# Patient Record
Sex: Female | Born: 1945
Health system: Southern US, Community
[De-identification: ages and names within clinical notes are randomized; demographics above are authoritative.]

## PROBLEM LIST (undated history)

## (undated) DIAGNOSIS — F419 Anxiety disorder, unspecified: Secondary | ICD-10-CM

## (undated) DIAGNOSIS — I509 Heart failure, unspecified: Secondary | ICD-10-CM

## (undated) DIAGNOSIS — I4891 Unspecified atrial fibrillation: Secondary | ICD-10-CM

## (undated) DIAGNOSIS — I10 Essential (primary) hypertension: Secondary | ICD-10-CM

## (undated) HISTORY — PX: TUBAL LIGATION: SHX77

---

## 2014-05-11 ENCOUNTER — Encounter (HOSPITAL_COMMUNITY): Payer: Self-pay | Admitting: Emergency Medicine

## 2014-05-11 ENCOUNTER — Emergency Department (HOSPITAL_COMMUNITY): Payer: Medicare HMO

## 2014-05-11 ENCOUNTER — Inpatient Hospital Stay (HOSPITAL_COMMUNITY)
Admission: EM | Admit: 2014-05-11 | Discharge: 2014-05-16 | DRG: 292 | Disposition: A | Discharge status: Home / Self Care | Payer: Medicare HMO | Attending: Internal Medicine | Admitting: Internal Medicine

## 2014-05-11 DIAGNOSIS — I481 Persistent atrial fibrillation: Secondary | ICD-10-CM

## 2014-05-11 DIAGNOSIS — J811 Chronic pulmonary edema: Secondary | ICD-10-CM | POA: Diagnosis present

## 2014-05-11 DIAGNOSIS — F1010 Alcohol abuse, uncomplicated: Secondary | ICD-10-CM | POA: Diagnosis present

## 2014-05-11 DIAGNOSIS — J90 Pleural effusion, not elsewhere classified: Secondary | ICD-10-CM | POA: Diagnosis present

## 2014-05-11 DIAGNOSIS — I10 Essential (primary) hypertension: Secondary | ICD-10-CM | POA: Diagnosis present

## 2014-05-11 DIAGNOSIS — F17200 Nicotine dependence, unspecified, uncomplicated: Secondary | ICD-10-CM | POA: Diagnosis present

## 2014-05-11 DIAGNOSIS — J810 Acute pulmonary edema: Secondary | ICD-10-CM | POA: Diagnosis present

## 2014-05-11 DIAGNOSIS — I4891 Unspecified atrial fibrillation: Secondary | ICD-10-CM | POA: Diagnosis present

## 2014-05-11 DIAGNOSIS — E871 Hypo-osmolality and hyponatremia: Secondary | ICD-10-CM | POA: Diagnosis present

## 2014-05-11 DIAGNOSIS — E8809 Other disorders of plasma-protein metabolism, not elsewhere classified: Secondary | ICD-10-CM | POA: Diagnosis present

## 2014-05-11 DIAGNOSIS — I272 Other secondary pulmonary hypertension: Secondary | ICD-10-CM | POA: Diagnosis present

## 2014-05-11 DIAGNOSIS — R600 Localized edema: Secondary | ICD-10-CM | POA: Diagnosis present

## 2014-05-11 DIAGNOSIS — I5023 Acute on chronic systolic (congestive) heart failure: Secondary | ICD-10-CM | POA: Diagnosis present

## 2014-05-11 DIAGNOSIS — Z716 Tobacco abuse counseling: Secondary | ICD-10-CM | POA: Diagnosis not present

## 2014-05-11 DIAGNOSIS — F1020 Alcohol dependence, uncomplicated: Secondary | ICD-10-CM | POA: Diagnosis present

## 2014-05-11 DIAGNOSIS — Z720 Tobacco use: Secondary | ICD-10-CM | POA: Diagnosis present

## 2014-05-11 DIAGNOSIS — E876 Hypokalemia: Secondary | ICD-10-CM | POA: Diagnosis present

## 2014-05-11 DIAGNOSIS — I482 Chronic atrial fibrillation: Secondary | ICD-10-CM | POA: Diagnosis present

## 2014-05-11 DIAGNOSIS — F411 Generalized anxiety disorder: Secondary | ICD-10-CM | POA: Diagnosis present

## 2014-05-11 HISTORY — DX: Essential (primary) hypertension: I10

## 2014-05-11 HISTORY — DX: Anxiety disorder, unspecified: F41.9

## 2014-05-11 LAB — BASIC METABOLIC PANEL
ANION GAP: 14 (ref 5–15)
BUN: 7 mg/dL (ref 6–23)
CALCIUM: 8.5 mg/dL (ref 8.4–10.5)
CHLORIDE: 90 meq/L — AB (ref 96–112)
CO2: 25 meq/L (ref 19–32)
Creatinine, Ser: 0.63 mg/dL (ref 0.50–1.10)
GFR calc Af Amer: >90 mL/min (ref >90)
GFR calc non Af Amer: >90 mL/min (ref >90)
Glucose, Bld: 108 mg/dL — ABNORMAL HIGH (ref 70–99)
Potassium: 3.2 mEq/L — ABNORMAL LOW (ref 3.7–5.3)
Sodium: 129 mEq/L — ABNORMAL LOW (ref 137–147)

## 2014-05-11 LAB — CBC WITH DIFFERENTIAL/PLATELET
Basophils Absolute: 0 10*3/uL (ref 0.0–0.1)
Basophils Relative: 0 % (ref 0–1)
Eosinophils Absolute: 0.1 10*3/uL (ref 0.0–0.7)
Eosinophils Relative: 1 % (ref 0–5)
HCT: 39.1 % (ref 36.0–46.0)
Hemoglobin: 13.2 g/dL (ref 12.0–15.0)
Lymphocytes Relative: 13 % (ref 12–46)
Lymphs Abs: 1 10*3/uL (ref 0.7–4.0)
MCH: 29.8 pg (ref 26.0–34.0)
MCHC: 33.8 g/dL (ref 30.0–36.0)
MCV: 88.3 fL (ref 78.0–100.0)
Monocytes Absolute: 0.5 10*3/uL (ref 0.1–1.0)
Monocytes Relative: 7 % (ref 3–12)
Neutro Abs: 6.2 10*3/uL (ref 1.7–7.7)
Neutrophils Relative %: 79 % — ABNORMAL HIGH (ref 43–77)
Platelets: 252 10*3/uL (ref 150–400)
RBC: 4.43 MIL/uL (ref 3.87–5.11)
RDW: 14.6 % (ref 11.5–15.5)
WBC: 7.8 10*3/uL (ref 4.0–10.5)

## 2014-05-11 LAB — I-STAT TROPONIN, ED: Troponin i, poc: 0.01 ng/mL (ref 0.00–0.08)

## 2014-05-11 LAB — MRSA PCR SCREENING: MRSA BY PCR: NEGATIVE

## 2014-05-11 LAB — TROPONIN I: Troponin I: <0.3 ng/mL (ref <0.30)

## 2014-05-11 LAB — PRO B NATRIURETIC PEPTIDE: Pro B Natriuretic peptide (BNP): 9349 pg/mL — ABNORMAL HIGH (ref 0–125)

## 2014-05-11 LAB — TSH: TSH: 3.26 u[IU]/mL (ref 0.350–4.500)

## 2014-05-11 LAB — PROTIME-INR
INR: 1.26 (ref 0.00–1.49)
Prothrombin Time: 15.9 seconds — ABNORMAL HIGH (ref 11.6–15.2)

## 2014-05-11 MED ORDER — SODIUM CHLORIDE 0.9 % IV SOLN
INTRAVENOUS | Status: DC
  Administered 2014-05-11: 21:00:00 via INTRAVENOUS

## 2014-05-11 MED ORDER — ACETAMINOPHEN 325 MG PO TABS: 650.0000 mg | ORAL_TABLET | Freq: Four times a day (QID) | ORAL | Status: DC | PRN

## 2014-05-11 MED ORDER — POTASSIUM CHLORIDE CRYS ER 20 MEQ PO TBCR
40.0000 meq | EXTENDED_RELEASE_TABLET | Freq: Two times a day (BID) | ORAL | Status: DC
  Filled 2014-05-11: qty 2

## 2014-05-11 MED ORDER — LORAZEPAM 2 MG/ML IJ SOLN
2.0000 mg | INTRAMUSCULAR | Status: DC | PRN
  Administered 2014-05-11: 2 mg via INTRAVENOUS
  Filled 2014-05-11: qty 1

## 2014-05-11 MED ORDER — HEPARIN BOLUS VIA INFUSION
3500.0000 [IU] | Freq: Once | INTRAVENOUS | Status: AC
  Administered 2014-05-11: 3500 [IU] via INTRAVENOUS
  Filled 2014-05-11: qty 3500

## 2014-05-11 MED ORDER — NICOTINE 14 MG/24HR TD PT24
14.0000 mg | MEDICATED_PATCH | Freq: Every day | TRANSDERMAL | Status: DC
  Administered 2014-05-11 – 2014-05-16 (×6): 14 mg via TRANSDERMAL
  Filled 2014-05-11 (×6): qty 1

## 2014-05-11 MED ORDER — ONDANSETRON HCL 4 MG PO TABS: 4.0000 mg | ORAL_TABLET | Freq: Four times a day (QID) | ORAL | Status: DC | PRN

## 2014-05-11 MED ORDER — POTASSIUM CHLORIDE 10 MEQ/100ML IV SOLN
10.0000 meq | INTRAVENOUS | Status: AC
  Administered 2014-05-11 – 2014-05-12 (×3): 10 meq via INTRAVENOUS
  Filled 2014-05-11: qty 100

## 2014-05-11 MED ORDER — DILTIAZEM HCL 100 MG IV SOLR
5.0000 mg/h | Freq: Once | INTRAVENOUS | Status: AC
  Administered 2014-05-11: 5 mg/h via INTRAVENOUS

## 2014-05-11 MED ORDER — ESCITALOPRAM OXALATE 10 MG PO TABS
20.0000 mg | ORAL_TABLET | Freq: Every day | ORAL | Status: DC
  Administered 2014-05-13 – 2014-05-15 (×3): 20 mg via ORAL
  Filled 2014-05-11 (×5): qty 1

## 2014-05-11 MED ORDER — FUROSEMIDE 10 MG/ML IJ SOLN
40.0000 mg | Freq: Once | INTRAMUSCULAR | Status: AC
  Administered 2014-05-11: 40 mg via INTRAVENOUS
  Filled 2014-05-11: qty 4

## 2014-05-11 MED ORDER — POTASSIUM CHLORIDE 10 MEQ/100ML IV SOLN
10.0000 meq | INTRAVENOUS | Status: AC
  Administered 2014-05-11: 10 meq via INTRAVENOUS
  Filled 2014-05-11: qty 100

## 2014-05-11 MED ORDER — BUSPIRONE HCL 5 MG PO TABS
5.0000 mg | ORAL_TABLET | Freq: Three times a day (TID) | ORAL | Status: DC
  Administered 2014-05-12 – 2014-05-16 (×12): 5 mg via ORAL
  Filled 2014-05-11 (×15): qty 1

## 2014-05-11 MED ORDER — FLUTICASONE PROPIONATE 50 MCG/ACT NA SUSP
2.0000 | Freq: Every day | NASAL | Status: DC
  Administered 2014-05-11 – 2014-05-16 (×6): 2 via NASAL
  Filled 2014-05-11 (×2): qty 16

## 2014-05-11 MED ORDER — DILTIAZEM LOAD VIA INFUSION
15.0000 mg | Freq: Once | INTRAVENOUS | Status: AC
  Administered 2014-05-11: 15 mg via INTRAVENOUS
  Filled 2014-05-11: qty 15

## 2014-05-11 MED ORDER — SODIUM CHLORIDE 0.9 % IJ SOLN: 3.0000 mL | INTRAMUSCULAR | Status: DC | PRN

## 2014-05-11 MED ORDER — ONDANSETRON HCL 4 MG/2ML IJ SOLN: 4.0000 mg | Freq: Four times a day (QID) | INTRAMUSCULAR | Status: DC | PRN

## 2014-05-11 MED ORDER — ALBUTEROL SULFATE (2.5 MG/3ML) 0.083% IN NEBU: 2.5000 mg | INHALATION_SOLUTION | RESPIRATORY_TRACT | Status: DC | PRN

## 2014-05-11 MED ORDER — FUROSEMIDE 10 MG/ML IJ SOLN
40.0000 mg | Freq: Two times a day (BID) | INTRAMUSCULAR | Status: DC
  Administered 2014-05-11 – 2014-05-12 (×2): 40 mg via INTRAVENOUS
  Filled 2014-05-11 (×3): qty 4

## 2014-05-11 MED ORDER — FOLIC ACID 1 MG PO TABS
1.0000 mg | ORAL_TABLET | Freq: Every day | ORAL | Status: DC
  Administered 2014-05-11 – 2014-05-16 (×6): 1 mg via ORAL
  Filled 2014-05-11 (×6): qty 1

## 2014-05-11 MED ORDER — SODIUM CHLORIDE 0.9 % IJ SOLN
3.0000 mL | Freq: Two times a day (BID) | INTRAMUSCULAR | Status: DC
  Administered 2014-05-11 – 2014-05-15 (×5): 3 mL via INTRAVENOUS

## 2014-05-11 MED ORDER — ALBUTEROL SULFATE HFA 108 (90 BASE) MCG/ACT IN AERS: 2.0000 | INHALATION_SPRAY | RESPIRATORY_TRACT | Status: DC | PRN

## 2014-05-11 MED ORDER — VITAMIN B-1 100 MG PO TABS
100.0000 mg | ORAL_TABLET | Freq: Every day | ORAL | Status: DC
  Administered 2014-05-11 – 2014-05-16 (×6): 100 mg via ORAL
  Filled 2014-05-11 (×6): qty 1

## 2014-05-11 MED ORDER — SODIUM CHLORIDE 0.9 % IV SOLN: 250.0000 mL | INTRAVENOUS | Status: DC | PRN

## 2014-05-11 MED ORDER — SODIUM CHLORIDE 0.9 % IJ SOLN
3.0000 mL | Freq: Two times a day (BID) | INTRAMUSCULAR | Status: DC
  Administered 2014-05-12 – 2014-05-15 (×7): 3 mL via INTRAVENOUS

## 2014-05-11 MED ORDER — ACETAMINOPHEN 650 MG RE SUPP: 650.0000 mg | Freq: Four times a day (QID) | RECTAL | Status: DC | PRN

## 2014-05-11 MED ORDER — DILTIAZEM HCL 100 MG IV SOLR
5.0000 mg/h | INTRAVENOUS | Status: DC
  Administered 2014-05-11 – 2014-05-12 (×2): 5 mg/h via INTRAVENOUS
  Administered 2014-05-13: 10 mg/h via INTRAVENOUS

## 2014-05-11 MED ORDER — HEPARIN (PORCINE) IN NACL 100-0.45 UNIT/ML-% IJ SOLN
1000.0000 [IU]/h | INTRAMUSCULAR | Status: DC
  Administered 2014-05-11 – 2014-05-13 (×3): 1000 [IU]/h via INTRAVENOUS
  Filled 2014-05-11 (×6): qty 250

## 2014-05-11 MED ORDER — ALPRAZOLAM 0.5 MG PO TABS: 0.5000 mg | ORAL_TABLET | Freq: Three times a day (TID) | ORAL | Status: DC | PRN

## 2014-05-11 NOTE — Progress Notes (Signed)
ANTICOAGULATION CONSULT NOTE - Initial Consult  Pharmacy Consult for heparin Indication: atrial fibrillation  Allergies  Allergen Reactions  . Penicillins Rash    Patient Measurements: Height: 5\' 4"  (162.6 cm) Weight: 153 lb (69.4 kg) IBW/kg (Calculated) : 54.7 Heparin Dosing Weight: 68 kg  Vital Signs: Temp: 97.8 F (36.6 C) (11/30 1243) Temp Source: Oral (11/30 1243) BP: 109/62 mmHg (11/30 1500) Pulse Rate: 92 (11/30 1500)  Labs:  Recent Labs  05/11/14 1339  CREATININE 0.63    Estimated Creatinine Clearance: 64.4 mL/min (by C-G formula based on Cr of 0.63).   Medical History: Past Medical History  Diagnosis Date  . Hypertension   . Anxiety    Assessment: 68 yo f who presented to the ED on 11/30 for bilateral lower leg swelling.  Patient noted to be in afib. Pharmacy is consulted to begin heparin for new onset afib.  [facility]  Hgb 13.2, plts 252, no bleeding noted.   Goal of Therapy:  Heparin level 0.3-0.7 units/ml Monitor platelets by anticoagulation protocol: Yes   Plan:  Heparin bolus 3,500 units x 1 (~51 units/kg) Heparin infusion at 1,000 units/hr (~14.7 units/kg/hr) 6-hr HL @ 2200 Daily HL and CBC Monitor hgb/plts, s/s of bleeding, clinical course  Dvante Hands L. Roseanne RenoStewart, PharmD Clinical Pharmacy Resident Pager: [phone] 05/11/2014 3:54 PM

## 2014-05-11 NOTE — H&P (Signed)
[facility] History and Physical  Rebecca FlirtLinda Kissling WUX:[id number] DOB: 08/19/1945 DOA: 05/11/2014  Referring physician: er PCP: Delorse LekBURNETT,BRENT A, MD   Chief Complaint: le swelling and SOB  HPI: Rebecca Huerta is a 68 y.o. female  Who was sent in by her primary care physician for bilateral lower extremity swelling and new onset atrial fib with rates into the 130s. Patient has symptoms of shortness of breath and fatigue that started about 2-3 weeks ago. When she went to her PCP today, she was found to be in new onset A. Fib and she was sent to the ER.  Patient denies ever having irregular heart rate previously. Patient admits to drinking vodka every day. Amount varies. Patient also smokes cigarettes. She denies illicit drugs. No chills no dizziness no blurred vision. Daughter states the patient has been having some slurred speech but this apparently is due to the alcohol consumption.   in the ER patient was started on Cardizem drip which has controlled her heart rate into the 90s.  CHAD2-vasc of at least 3   Review of Systems:  All systems reviewed, negative unless stated above    Past Medical History  Diagnosis Date  . Hypertension   . Anxiety    History reviewed. No pertinent past surgical history. Social History:  reports that she has been smoking.  She does not have any smokeless tobacco history on file. She reports that she drinks alcohol. She reports that she does not use illicit drugs.  Allergies  Allergen Reactions  . Penicillins Rash    History reviewed. No pertinent family history.   Prior to Admission medications   Medication Sig Start Date End Date Taking? Authorizing Provider  ALPRAZolam Prudy Feeler(XANAX) 0.5 MG tablet Take 0.5 mg by mouth every 6 (six) hours as needed. 04/08/14  Yes Historical Provider, MD  amLODipine (NORVASC) 5 MG tablet Take 5 mg by mouth every other day. 03/09/14  Yes Historical Provider, MD  busPIRone (BUSPAR) 5 MG tablet Take 5 mg by mouth 3 (three)  times daily. 03/11/14  Yes Historical Provider, MD  escitalopram (LEXAPRO) 20 MG tablet Take 20 mg by mouth at bedtime. 04/08/14  Yes Historical Provider, MD  fluticasone (FLONASE) 50 MCG/ACT nasal spray Place 2 sprays into both nostrils daily. 04/08/14  Yes Historical Provider, MD  lisinopril-hydrochlorothiazide (PRINZIDE,ZESTORETIC) 20-12.5 MG per tablet Take 1 tablet by mouth every other day. 03/09/14  Yes Historical Provider, MD  PROAIR HFA 108 (90 BASE) MCG/ACT inhaler Inhale 2 puffs into the lungs every 4 (four) hours as needed. 04/08/14  Yes Historical Provider, MD   Physical Exam: Filed Vitals:   05/11/14 1330 05/11/14 1345 05/11/14 1400 05/11/14 1415  BP: 127/98 114/86 109/82 110/79  Pulse: 113 87 87 92  Temp:      TempSrc:      Resp: 24 18 22 20   Height:      Weight:      SpO2: 96% 95% 94% 95%    Wt Readings from Last 3 Encounters:  05/11/14 69.4 kg (153 lb)    General:  Appears calm and comfortable Eyes: PERRL, normal lids, irises & conjunctiva ENT: grossly normal hearing, lips & tongue Neck: no LAD, masses or thyromegaly Cardiovascular: irregular, + LE edema Respiratory: CTA bilaterally, no w/r/r. Normal respiratory effort. Abdomen: soft, ntnd Skin: no rash or induration seen on limited exam Musculoskeletal: grossly normal tone BUE/BLE Psychiatric: grossly normal mood and affect, speech fluent and appropriate Neurologic: grossly non-focal.  Labs on Admission:  Basic Metabolic Panel:  Recent Labs Lab 05/11/14 1339  NA 129*  K 3.2*  CL 90*  CO2 25  GLUCOSE 108*  BUN 7  CREATININE 0.63  CALCIUM 8.5   Liver Function Tests: No results for input(s): AST, ALT, ALKPHOS, BILITOT, PROT, ALBUMIN in the last 168 hours. No results for input(s): LIPASE, AMYLASE in the last 168 hours. No results for input(s): AMMONIA in the last 168 hours. CBC: No results for input(s): WBC, NEUTROABS, HGB, HCT, MCV, PLT in the last 168 hours. Cardiac Enzymes: No results  for input(s): CKTOTAL, CKMB, CKMBINDEX, TROPONINI in the last 168 hours.  BNP (last 3 results)  Recent Labs  05/11/14 1339  PROBNP 9349.0*   CBG: No results for input(s): GLUCAP in the last 168 hours.  Radiological Exams on Admission: Dg Chest Port 1 View  05/11/2014   CLINICAL DATA:  Cardiac palpitations/tachycardia  EXAM: PORTABLE CHEST - 1 VIEW  COMPARISON:  None.  FINDINGS: There is a moderate pleural effusion on the left with left lower lobe consolidation. There is a minimal effusion on the right. Elsewhere lungs are clear. Heart is enlarged with pulmonary vascularity within normal limits. No adenopathy. There is atherosclerotic change in the aortic arch.  IMPRESSION: Moderate pleural effusion on the left with a lower lobe consolidation. Minimal effusion on the right. Cardiomegaly present.   Electronically Signed   By: Bretta BangWilliam  Woodruff M.D.   On: 05/11/2014 13:15    EKG: Independently reviewed. A fib with RVR  Assessment/Plan Active Problems:   A-fib   Edema leg   Pulmonary edema   Tobacco abuse   Alcohol abuse   New onset A. fib: Cardizem drip to be titrated to keep heart rate 90-100 bpm, heparin drip once INR is back, check TSH, cycle cardiac enzymes, echo  Pulmonary edema along with lower extremity edema: Echo, Lasix IV  Alcohol abuse: CIWA protocol  Tobacco abuse: Patient declines nicotine patch at this time  Hypo-kalemia: Replete  Hyponatremia: Probably due to alcohol consumption or volume overloaded, monitor with IV Lasix   HTN- hold home meds   Code Status: full DVT Prophylaxis: Family Communication: daughter at bedside Disposition Plan: 2-3 days  Time spent: 75 minutes  Marlin CanaryVANN, Twilla Khouri [facility] Pager [phone]

## 2014-05-11 NOTE — ED Provider Notes (Signed)
CSN: [id number]     Arrival date & time 05/11/14  1232 History   First MD Initiated Contact with Patient 05/11/14 1257     Chief Complaint  Patient presents with  . Palpitations     (Consider location/radiation/quality/duration/timing/severity/associated sxs/prior Treatment) HPI Patient presents to the emergency department with palpitations and lower extremity swelling that started one month ago.  Patient states that she has also had some shortness of breath associated with these symptoms.  Patient states that she did not have any back pain, neck pain, fever, weakness, dizziness, headache, blurred vision, cough, diaphoresis, or syncope.  The patient states that some exertion seemed to make her condition worse.  Patient states that she went to her doctor today who found that she was in atrial fibrillation and sent her to the emergency department Past Medical History  Diagnosis Date  . Hypertension   . Anxiety    History reviewed. No pertinent past surgical history. History reviewed. No pertinent family history. History  Substance Use Topics  . Smoking status: Current Every Day Smoker  . Smokeless tobacco: Not on file  . Alcohol Use: Yes   OB History    No data available     Review of Systems All other systems negative except as documented in the HPI. All pertinent positives and negatives as reviewed in the HPI.   Allergies  Penicillins  Home Medications   Prior to Admission medications   Medication Sig Start Date End Date Taking? Authorizing Provider  ALPRAZolam Prudy Feeler(XANAX) 0.5 MG tablet Take 0.5 mg by mouth every 6 (six) hours as needed. 04/08/14  Yes Historical Provider, MD  amLODipine (NORVASC) 5 MG tablet Take 5 mg by mouth every other day. 03/09/14  Yes Historical Provider, MD  busPIRone (BUSPAR) 5 MG tablet Take 5 mg by mouth 3 (three) times daily. 03/11/14  Yes Historical Provider, MD  escitalopram (LEXAPRO) 20 MG tablet Take 20 mg by mouth at bedtime. 04/08/14  Yes  Historical Provider, MD  fluticasone (FLONASE) 50 MCG/ACT nasal spray Place 2 sprays into both nostrils daily. 04/08/14  Yes Historical Provider, MD  lisinopril-hydrochlorothiazide (PRINZIDE,ZESTORETIC) 20-12.5 MG per tablet Take 1 tablet by mouth every other day. 03/09/14  Yes Historical Provider, MD  PROAIR HFA 108 (90 BASE) MCG/ACT inhaler Inhale 2 puffs into the lungs every 4 (four) hours as needed. 04/08/14  Yes Historical Provider, MD   BP 110/79 mmHg  Pulse 92  Temp(Src) 97.8 F (36.6 C) (Oral)  Resp 20  Ht 5\' 4"  (1.626 m)  Wt 153 lb (69.4 kg)  BMI 26.25 kg/m2  SpO2 95% Physical Exam  Constitutional: She is oriented to person, place, and time. She appears well-developed and well-nourished. No distress.  HENT:  Head: Normocephalic and atraumatic.  Mouth/Throat: Oropharynx is clear and moist.  Eyes: Pupils are equal, round, and reactive to light.  Neck: Normal range of motion. Neck supple.  Cardiovascular: An irregularly irregular rhythm present. Tachycardia present.  Exam reveals no gallop and no friction rub.   No murmur heard. Pulmonary/Chest: Effort normal and breath sounds normal. No respiratory distress.  Musculoskeletal: She exhibits edema.  Neurological: She is alert and oriented to person, place, and time. She exhibits normal muscle tone. Coordination normal.  Skin: Skin is warm and dry.  Nursing note and vitals reviewed.   ED Course  Procedures (including critical care time) Labs Review Labs Reviewed  BASIC METABOLIC PANEL - Abnormal; Notable for the following:    Sodium 129 (*)    Potassium 3.2 (*)  Chloride 90 (*)    Glucose, Bld 108 (*)    All other components within normal limits  PRO B NATRIURETIC PEPTIDE - Abnormal; Notable for the following:    Pro B Natriuretic peptide (BNP) 9349.0 (*)    All other components within normal limits  Rosezena SensorI-STAT TROPOININ, ED    Imaging Review Dg Chest Port 1 View  05/11/2014   CLINICAL DATA:  Cardiac  palpitations/tachycardia  EXAM: PORTABLE CHEST - 1 VIEW  COMPARISON:  None.  FINDINGS: There is a moderate pleural effusion on the left with left lower lobe consolidation. There is a minimal effusion on the right. Elsewhere lungs are clear. Heart is enlarged with pulmonary vascularity within normal limits. No adenopathy. There is atherosclerotic change in the aortic arch.  IMPRESSION: Moderate pleural effusion on the left with a lower lobe consolidation. Minimal effusion on the right. Cardiomegaly present.   Electronically Signed   By: Bretta BangWilliam  Woodruff M.D.   On: 05/11/2014 13:15     EKG Interpretation   Date/Time:  Monday May 11 2014 12:44:52 EST Ventricular Rate:  128 PR Interval:    QRS Duration: 98 QT Interval:  340 QTC Calculation: 496 R Axis:   -67 Text Interpretation:  Atrial fibrillation Left axis deviation Non-specific  intra-ventricular conduction delay Non-specific ST-t changes No previous  tracing Confirmed by Denton LankSTEINL  MD, Caryn BeeKEVIN ([id number]) on 05/11/2014 1:01:19 PM      Patient will be admitted to the stepdown unit on 3 W. after speaking with the [facility]    Jamesetta OrleansChristopher W OceolaLawyer, PA-C 05/11/14 1519  Suzi RootsKevin E Steinl, MD 05/11/14 [phone]

## 2014-05-11 NOTE — ED Notes (Signed)
Went to pmd pta for bilat lower leg swelling and weeping and was found to be in afib new onset in 130s denies any discomfort

## 2014-05-12 ENCOUNTER — Inpatient Hospital Stay (HOSPITAL_COMMUNITY): Payer: Medicare HMO

## 2014-05-12 DIAGNOSIS — E871 Hypo-osmolality and hyponatremia: Secondary | ICD-10-CM | POA: Diagnosis present

## 2014-05-12 DIAGNOSIS — E876 Hypokalemia: Secondary | ICD-10-CM | POA: Diagnosis present

## 2014-05-12 DIAGNOSIS — I059 Rheumatic mitral valve disease, unspecified: Secondary | ICD-10-CM

## 2014-05-12 DIAGNOSIS — J90 Pleural effusion, not elsewhere classified: Secondary | ICD-10-CM | POA: Diagnosis present

## 2014-05-12 DIAGNOSIS — F411 Generalized anxiety disorder: Secondary | ICD-10-CM | POA: Diagnosis present

## 2014-05-12 DIAGNOSIS — I4891 Unspecified atrial fibrillation: Secondary | ICD-10-CM

## 2014-05-12 DIAGNOSIS — I10 Essential (primary) hypertension: Secondary | ICD-10-CM

## 2014-05-12 DIAGNOSIS — R600 Localized edema: Secondary | ICD-10-CM

## 2014-05-12 DIAGNOSIS — I5023 Acute on chronic systolic (congestive) heart failure: Secondary | ICD-10-CM | POA: Diagnosis present

## 2014-05-12 DIAGNOSIS — J810 Acute pulmonary edema: Secondary | ICD-10-CM | POA: Diagnosis present

## 2014-05-12 LAB — BASIC METABOLIC PANEL
Anion gap: 12 (ref 5–15)
BUN: 8 mg/dL (ref 6–23)
CHLORIDE: 93 meq/L — AB (ref 96–112)
CO2: 27 meq/L (ref 19–32)
Calcium: 8.4 mg/dL (ref 8.4–10.5)
Creatinine, Ser: 0.78 mg/dL (ref 0.50–1.10)
GFR calc Af Amer: >90 mL/min (ref >90)
GFR, EST NON AFRICAN AMERICAN: 84 mL/min — AB (ref >90)
GLUCOSE: 96 mg/dL (ref 70–99)
POTASSIUM: 3.5 meq/L — AB (ref 3.7–5.3)
Sodium: 132 mEq/L — ABNORMAL LOW (ref 137–147)

## 2014-05-12 LAB — COMPREHENSIVE METABOLIC PANEL
ALK PHOS: 81 U/L (ref 39–117)
ALT: 35 U/L (ref 0–35)
AST: 31 U/L (ref 0–37)
Albumin: 3 g/dL — ABNORMAL LOW (ref 3.5–5.2)
Anion gap: 16 — ABNORMAL HIGH (ref 5–15)
BUN: 9 mg/dL (ref 6–23)
CALCIUM: 8.8 mg/dL (ref 8.4–10.5)
CO2: 26 meq/L (ref 19–32)
Chloride: 94 mEq/L — ABNORMAL LOW (ref 96–112)
Creatinine, Ser: 0.84 mg/dL (ref 0.50–1.10)
GFR, EST AFRICAN AMERICAN: 81 mL/min — AB (ref >90)
GFR, EST NON AFRICAN AMERICAN: 70 mL/min — AB (ref >90)
GLUCOSE: 101 mg/dL — AB (ref 70–99)
POTASSIUM: 3.2 meq/L — AB (ref 3.7–5.3)
SODIUM: 136 meq/L — AB (ref 137–147)
Total Bilirubin: 0.7 mg/dL (ref 0.3–1.2)
Total Protein: 6.4 g/dL (ref 6.0–8.3)

## 2014-05-12 LAB — BLOOD GAS, ARTERIAL
ACID-BASE EXCESS: 3.2 mmol/L — AB (ref 0.0–2.0)
Bicarbonate: 26.5 mEq/L — ABNORMAL HIGH (ref 20.0–24.0)
Drawn by: 249101
FIO2: 0.21 %
O2 SAT: 93.5 %
PCO2 ART: 35.7 mmHg (ref 35.0–45.0)
Patient temperature: 98.6
TCO2: 27.6 mmol/L (ref 0–100)
pH, Arterial: 7.483 — ABNORMAL HIGH (ref 7.350–7.450)
pO2, Arterial: 64.4 mmHg — ABNORMAL LOW (ref 80.0–100.0)

## 2014-05-12 LAB — CBC
HCT: 37 % (ref 36.0–46.0)
Hemoglobin: 12.4 g/dL (ref 12.0–15.0)
MCH: 29.9 pg (ref 26.0–34.0)
MCHC: 33.5 g/dL (ref 30.0–36.0)
MCV: 89.2 fL (ref 78.0–100.0)
Platelets: 225 10*3/uL (ref 150–400)
RBC: 4.15 MIL/uL (ref 3.87–5.11)
RDW: 14.9 % (ref 11.5–15.5)
WBC: 7.3 10*3/uL (ref 4.0–10.5)

## 2014-05-12 LAB — RAPID URINE DRUG SCREEN, HOSP PERFORMED
AMPHETAMINES: NOT DETECTED
BENZODIAZEPINES: NOT DETECTED
Barbiturates: NOT DETECTED
COCAINE: NOT DETECTED
OPIATES: NOT DETECTED
Tetrahydrocannabinol: NOT DETECTED

## 2014-05-12 LAB — TROPONIN I
Troponin I: <0.3 ng/mL (ref <0.30)
Troponin I: <0.3 ng/mL (ref <0.30)

## 2014-05-12 LAB — HEPARIN LEVEL (UNFRACTIONATED)
Heparin Unfractionated: 0.47 IU/mL (ref 0.30–0.70)
Heparin Unfractionated: 0.47 IU/mL (ref 0.30–0.70)

## 2014-05-12 LAB — MAGNESIUM: Magnesium: 1.6 mg/dL (ref 1.5–2.5)

## 2014-05-12 MED ORDER — MAGNESIUM SULFATE 2 GM/50ML IV SOLN
2.0000 g | Freq: Once | INTRAVENOUS | Status: AC
Start: 2014-05-13 — End: 2014-05-13
  Administered 2014-05-13: 2 g via INTRAVENOUS
  Filled 2014-05-12: qty 50

## 2014-05-12 MED ORDER — LORAZEPAM 2 MG/ML IJ SOLN: 0.0000 mg | Freq: Two times a day (BID) | INTRAMUSCULAR | Status: DC

## 2014-05-12 MED ORDER — CARVEDILOL 6.25 MG PO TABS
6.2500 mg | ORAL_TABLET | Freq: Two times a day (BID) | ORAL | Status: DC
  Administered 2014-05-13: 6.25 mg via ORAL
  Filled 2014-05-12 (×4): qty 1

## 2014-05-12 MED ORDER — LORAZEPAM 2 MG/ML IJ SOLN: 0.0000 mg | INTRAMUSCULAR | Status: AC

## 2014-05-12 MED ORDER — LORAZEPAM 2 MG/ML IJ SOLN: 0.0000 mg | INTRAMUSCULAR | Status: DC

## 2014-05-12 MED ORDER — LORAZEPAM 2 MG/ML IJ SOLN: 0.0000 mg | Freq: Four times a day (QID) | INTRAMUSCULAR | Status: DC

## 2014-05-12 MED ORDER — LORAZEPAM 1 MG PO TABS: 1.0000 mg | ORAL_TABLET | ORAL | Status: DC | PRN

## 2014-05-12 MED ORDER — LORAZEPAM 2 MG/ML IJ SOLN: 1.0000 mg | INTRAMUSCULAR | Status: DC | PRN

## 2014-05-12 MED ORDER — FUROSEMIDE 10 MG/ML IJ SOLN
80.0000 mg | Freq: Once | INTRAMUSCULAR | Status: AC
  Administered 2014-05-12: 80 mg via INTRAVENOUS

## 2014-05-12 MED ORDER — POTASSIUM CHLORIDE 10 MEQ/100ML IV SOLN
10.0000 meq | INTRAVENOUS | Status: AC
  Administered 2014-05-13 (×3): 10 meq via INTRAVENOUS
  Filled 2014-05-12 (×3): qty 100

## 2014-05-12 MED ORDER — LORAZEPAM 2 MG/ML IJ SOLN
1.0000 mg | INTRAMUSCULAR | Status: DC | PRN
Start: 2014-05-12 — End: 2014-05-12

## 2014-05-12 MED ORDER — FUROSEMIDE 10 MG/ML IJ SOLN
60.0000 mg | Freq: Two times a day (BID) | INTRAMUSCULAR | Status: DC
  Administered 2014-05-12 – 2014-05-15 (×6): 60 mg via INTRAVENOUS
  Filled 2014-05-12 (×8): qty 6

## 2014-05-12 NOTE — Progress Notes (Signed)
ANTICOAGULATION CONSULT NOTE Pharmacy Consult for heparin Indication: atrial fibrillation   Allergies  Allergen Reactions  . Penicillins Rash    Patient Measurements: Height: 5\' 4"  (162.6 cm) Weight: 155 lb 3.2 oz (70.398 kg) IBW/kg (Calculated) : 54.7 Heparin Dosing Weight: 68 kg  Vital Signs: Temp: 97.5 F (36.4 C) (12/01 0700) Temp Source: Oral (12/01 0700) BP: 135/102 mmHg (12/01 0800) Pulse Rate: 90 (12/01 1100)  Labs:  Recent Labs  05/11/14 1339 05/11/14 1505 05/11/14 1600 05/11/14 1811 05/11/14 2323 05/12/14 0500  HGB  --  13.2  --   --   --  12.4  HCT  --  39.1  --   --   --  37.0  PLT  --  252  --   --   --  225  LABPROT  --   --  15.9*  --   --   --   INR  --   --  1.26  --   --   --   HEPARINUNFRC  --   --   --   --  0.47 0.47  CREATININE 0.63  --   --   --   --  0.78  TROPONINI  --   --   --  <0.30 <0.30 <0.30    Estimated Creatinine Clearance: 64.8 mL/min (by C-G formula based on Cr of 0.78).  Assessment: 68 yo f who presented to the ED on 11/30 for bilateral lower leg swelling. Pharmacy is consulted to begin heparin for new onset afib.   PMH: Afib, EtOH/tobacco abuse, peripheral edema  Anticoagulation: New onset afib - Patient is not on AC PTA.  Heparin within goal.  No bleeding noted.  H/H wnl, plt wnl.   Cardiovascular: Afib - BP within goal, HR 90-100s.   On furosemide 40 mg BID, diltiazem gtt 5mg /hr.    Nephrology: Crcl ~60-65 mL/min, K low - supped , Mg?  PTA Medication Issues:  none  Best Practices: DVT Px: Hep gtt  Goal of Therapy:  Heparin level 0.3-0.7 units/ml Monitor platelets by anticoagulation protocol: Yes   Plan:  - Continue heparin infusion at 1,000 units/hr - Daily HL and CBC - Monitor hgb/plts, s/s of bleeding, clinical course  Red ChristiansSamson Aydin Cavalieri, Pharm. D. Clinical Pharmacy Resident Pager: [phone] Ph: [phone] 05/12/2014 11:52 AM

## 2014-05-12 NOTE — Progress Notes (Signed)
ANTICOAGULATION CONSULT NOTE Pharmacy Consult for heparin Indication: atrial fibrillation  Allergies  Allergen Reactions  . Penicillins Rash    Patient Measurements: Height: 5\' 4"  (162.6 cm) Weight: 153 lb (69.4 kg) IBW/kg (Calculated) : 54.7 Heparin Dosing Weight: 68 kg  Vital Signs: Temp: 98.3 F (36.8 C) (11/30 2323) Temp Source: Oral (11/30 2323) BP: 114/71 mmHg (11/30 2100) Pulse Rate: 104 (11/30 2100)  Labs:  Recent Labs  05/11/14 1339 05/11/14 1505 05/11/14 1600 05/11/14 1811 05/11/14 2323  HGB  --  13.2  --   --   --   HCT  --  39.1  --   --   --   PLT  --  252  --   --   --   LABPROT  --   --  15.9*  --   --   INR  --   --  1.26  --   --   HEPARINUNFRC  --   --   --   --  0.47  CREATININE 0.63  --   --   --   --   TROPONINI  --   --   --  <0.30 <0.30    Estimated Creatinine Clearance: 64.4 mL/min (by C-G formula based on Cr of 0.63).   Assessment: 68 yo female with Afib for heparin  Goal of Therapy:  Heparin level 0.3-0.7 units/ml Monitor platelets by anticoagulation protocol: Yes   Plan:  Continue Heparin at current rate  Follow-up am labs.  Geannie RisenGreg Hyder Deman, PharmD, BCPS   05/12/2014 12:44 AM

## 2014-05-12 NOTE — Care Management Note (Addendum)
    Page 1 of 1   05/14/2014     9:53:32 AM CARE MANAGEMENT NOTE 05/14/2014  Patient:  Rebecca Huerta,Rebecca Huerta   Account Number:  [id number]  Date Initiated:  05/12/2014  Documentation initiated by:  Junius CreamerWELL,DEBBIE  Subjective/Objective Assessment:   adm w at fib     Action/Plan:   lives w husband, pcp dr Kipp Broodbrent burnette   Anticipated DC Date:  05/16/2014   Anticipated DC Plan:  HOME/SELF CARE      DC Planning Services  CM consult  Medication Assistance      Choice offered to / List presented to:             Status of service:   Medicare Important Message given?  YES (If response is "NO", the following Medicare IM given date fields will be blank) Date Medicare IM given:  05/14/2014 Medicare IM given by:  Junius CreamerWELL,DEBBIE Date Additional Medicare IM given:   Additional Medicare IM given by:    Discharge Disposition:    Per UR Regulation:  Reviewed for med. necessity/level of care/duration of stay  If discussed at Long Length of Stay Meetings, dates discussed:    Comments:  12/3  0932 debbie Arie Powell rn,bsn pt will have 45.00 per month copay for xarelto, eliquis will have 170.62 per month copay, pradaxa will have 45.00 per month copay and pradaxa also requires prior auth at [phone].  12/2 1531 debbie Kourtlynn Trevor rn,bsn having cm sec ck on copay for pradaxa-eliquis-xarelto.

## 2014-05-12 NOTE — Plan of Care (Signed)
Problem: Consults Goal: Atrial Arhythmia Patient Education (See Patient Education module for education specifics.)  Outcome: Progressing Goal: Skin Care Protocol Initiated - if Braden Score 18 or less If consults are not indicated, leave blank or document N/A  Outcome: Not Applicable Date Met:  12/78/71 Goal: Tobacco Cessation referral if indicated Outcome: Completed/Met Date Met:  05/12/14 Pt states she has no intention of quitting smoking. Goal: Nutrition Consult-if indicated Outcome: Not Applicable Date Met:  83/67/25  Problem: Phase I Progression Outcomes Goal: If Ablation, see post EP orders Outcome: Not Applicable Date Met:  50/01/64 Goal: Initial discharge plan identified Outcome: Completed/Met Date Met:  05/12/14 Goal: Hemodynamically stable Outcome: Completed/Met Date Met:  05/12/14 Goal: Other Phase I Outcomes/Goals Outcome: Progressing  Problem: Phase II Progression Outcomes Goal: Ventricular heart rate < 100/min Outcome: Progressing Goal: Anticoagulation Therapy per MD order Outcome: Completed/Met Date Met:  05/12/14 Goal: CV Risk Factors identified Outcome: Completed/Met Date Met:  05/12/14 Goal: Pain controlled Outcome: Completed/Met Date Met:  05/12/14 Goal: Progress activity as tolerated unless otherwise ordered Outcome: Progressing Goal: Discharge plan established Outcome: Completed/Met Date Met:  05/12/14 Goal: Tolerating diet Outcome: Completed/Met Date Met:  05/12/14

## 2014-05-12 NOTE — Progress Notes (Signed)
[place] TEAM 1 - Stepdown/ICU TEAM Progress Note  Rebecca Huerta WUJ:[id number] DOB: 13-Apr-1946 DOA: 05/11/2014 PCP: Delorse Lek, MD  Admit HPI / Brief Narrative: Rebecca Huerta is a 68 y.o. WF PMHx  anxiety, alcohol abuse,acute on chronic systolic CHF, HTN, Who was sent in by her primary care physician for bilateral lower extremity swelling and new onset atrial fib with rates into the 130s. Patient has symptoms of shortness of breath and fatigue that started about 2-3 weeks ago. When she went to her PCP today, she was found to be in new onset A. Fib and she was sent to the ER. Patient denies ever having irregular heart rate previously. Patient admits to drinking vodka every day. Amount varies. Patient also smokes cigarettes. She denies illicit drugs. No chills no dizziness no blurred vision. Daughter states the patient has been having some slurred speech but this apparently is due to the alcohol consumption.  in the ER patient was started on Cardizem drip which has controlled her heart rate into the 90s.  CHAD2-vasc of at least 3  HPI/Subjective: 12/1 A/O 4, patient states swelling of ankles and legs started approximately 2 weeks ago, positive SOB, negative CP, states smokes 1 PPD 50 years. Drinks 3 glasses of blocker daily (unsure of total amount). States was seen by her PCM Dr. Mady Gemma one month ago for similar signs and symptoms. States at that time was started on Lexapro and Xanax for her symptoms.  Assessment/Plan:  New onset A. fib:  -Most likely multifactorial alcohol abuse, acute on chronic decompensated CHF.  -Continue Cardizem drip titrate to keep heart rate 60-90 bpm  -Continue heparin drip  -TSH within normal limit  -Troponin 3 negative  Systolic CHF (most likely  acute on chronic) -Lasix IV 80 mg 1 -Increase Lasix 60 mg BID -Cardiology consult in the a.m., patient has never seen a cardiologist.   HTN  -Start on Coreg  6.25 mg BID -Monitor BP closely,  would decrease Cardizem before holding Coreg secondary to patient's significant systolic CHF.   Pulmonary hypertension  -See systolic CHF, HTN   Pleural effusion -Lt>>>Rt, will try aggressive diuresis, if no improvement in 24-48 hrs, will perform thoracentesis.   Coughing spasms; aspiration? -Most likely secondary to decompensated CHF, pleural effusion -Patient with possible aspiration, will make patient nothing by mouth until she passes swallow study -See pleural effusion   Pulmonary edema along with lower extremity edema: -See systolic CHF, and echocardiogram results below  Alcohol abuse:  -CIWA  -UDS negative  Tobacco abuse:  -Patient counseled on need to stop smoking, not interested in stopping; declines nicotine patch at this time  Hypo-kalemia: -Replete -Goal maintain potassium>4  Hypomagnesemia -Replete -Goal maintain magnesium> 2  Hyponatremia: -Probably due to alcohol consumption or volume overloaded, monitor with IV Lasix   HTN - hold home meds   Code Status: FULL Family Communication: Daughter and best friend present at time of exam Disposition Plan: Resolution CHF    Consultants:   Procedure/Significant Events: 11/30 PCXR; Moderate pleural effusion on the left with a lower lobe consolidation. Minimal effusion on right 12/1 echocardiogram;Left ventricle: mild LVH. Systolic function severely reduced.  -LVEF= 25% to 30%. Diffuse hypokinesis. - Mitral valve: There was severe regurgitation. - Left atrium: The atrium was moderately to severely dilated. - Tricuspid valve: There was moderate regurgitation. - Pulmonary arteries: PA peak pressure: 35 mm Hg (S). - Pericardium, extracardiac: A trivial pericardial effusion was identified. There was a left pleural effusion.   Culture  Antibiotics:   DVT prophylaxis: Heparin drip   Devices    LINES / TUBES:      Continuous Infusions: . sodium chloride 5 mL/hr at 05/12/14 0800  . diltiazem  (CARDIZEM) infusion 10 mg/hr (05/12/14 1750)  . heparin 1,000 Units/hr (05/12/14 1317)    Objective: VITAL SIGNS: Temp: 97.4 F (36.3 C) (12/01 1600) Temp Source: Oral (12/01 1600) BP: 137/91 mmHg (12/01 1900) Pulse Rate: 113 (12/01 1900) SPO2; FIO2:   Intake/Output Summary (Last 24 hours) at 05/12/14 1919 Last data filed at 05/12/14 1900  Gross per 24 hour  Intake 1362.25 ml  Output   1375 ml  Net -12.75 ml     Exam: General: A/O 4, NAD, No acute respiratory distress Lungs: Decreased breath sounds lingula/LLL/RLL, diffuse rhonchi remainder of lung, diffuse expiratory wheezing Cardiovascular: Irregular irregular rhythm and rate, without murmur gallop or rub normal S1 and S2 Abdomen: Nontender, nondistended, soft, bowel sounds positive, no rebound, no ascites, no appreciable mass Extremities: No significant cyanosis, clubbing, bilateral pitting edema to waist 2+ to 3+ pitting  edema     Data Reviewed: Basic Metabolic Panel:  Recent Labs Lab 05/11/14 1339 05/12/14 0500  NA 129* 132*  K 3.2* 3.5*  CL 90* 93*  CO2 25 27  GLUCOSE 108* 96  BUN 7 8  CREATININE 0.63 0.78  CALCIUM 8.5 8.4   Liver Function Tests: No results for input(s): AST, ALT, ALKPHOS, BILITOT, PROT, ALBUMIN in the last 168 hours. No results for input(s): LIPASE, AMYLASE in the last 168 hours. No results for input(s): AMMONIA in the last 168 hours. CBC:  Recent Labs Lab 05/11/14 1505 05/12/14 0500  WBC 7.8 7.3  NEUTROABS 6.2  --   HGB 13.2 12.4  HCT 39.1 37.0  MCV 88.3 89.2  PLT 252 225   Cardiac Enzymes:  Recent Labs Lab 05/11/14 1811 05/11/14 2323 05/12/14 0500  TROPONINI <0.30 <0.30 <0.30   BNP (last 3 results)  Recent Labs  05/11/14 1339  PROBNP 9349.0*   CBG: No results for input(s): GLUCAP in the last 168 hours.  Recent Results (from the past 240 hour(s))  MRSA PCR Screening     Status: None   Collection Time: 05/11/14  4:53 PM  Result Value Ref Range Status    MRSA by PCR NEGATIVE NEGATIVE Final    Comment:        The GeneXpert MRSA Assay (FDA approved for NASAL specimens only), is one component of a comprehensive MRSA colonization surveillance program. It is not intended to diagnose MRSA infection nor to guide or monitor treatment for MRSA infections.      Studies:  Recent x-ray studies have been reviewed in detail by the Attending Physician  Scheduled Meds:  Scheduled Meds: . busPIRone  5 mg Oral TID  . escitalopram  20 mg Oral QHS  . fluticasone  2 spray Each Nare Daily  . folic acid  1 mg Oral Daily  . furosemide  60 mg Intravenous BID  . LORazepam  0-4 mg Intravenous Q4H   Followed by  . [START ON 05/14/2014] LORazepam  0-4 mg Intravenous Q12H  . nicotine  14 mg Transdermal Daily  . sodium chloride  3 mL Intravenous Q12H  . sodium chloride  3 mL Intravenous Q12H  . thiamine  100 mg Oral Daily    Time spent on care of this patient: 40 mins   Drema DallasWOODS, Izadora Roehr, J , MD   [facility] Office  [phone] Pager [phone]- [phone]  On-Call/Text Page:  [email or web address]      password [email or web address]  If 7PM-7AM, please contact night-coverage [email or web address] Password [email or web address] 05/12/2014, 7:19 PM   LOS: 1 day

## 2014-05-12 NOTE — Progress Notes (Signed)
  Echocardiogram 2D Echocardiogram has been performed.  Rebecca Huerta FRANCES 05/12/2014, 3:28 PM

## 2014-05-12 NOTE — Progress Notes (Signed)
Dr. Joseph ArtWoods notified that abg and cxr are completed. Requested clarification on CIWA orders.  Will continue to monitor pt closely.

## 2014-05-12 NOTE — Plan of Care (Signed)
Problem: Consults Goal: Atrial Arhythmia Patient Education (See Patient Education module for education specifics.) Outcome: Progressing Goal: Skin Care Protocol Initiated - if Braden Score 18 or less If consults are not indicated, leave blank or document N/A Outcome: Progressing Goal: Tobacco Cessation referral if indicated Outcome: Progressing Goal: Diabetes Guidelines if Diabetic/Glucose > 140 If diabetic or lab glucose is > 140 mg/dl - Initiate Diabetes/Hyperglycemia Guidelines & Document Interventions  Outcome: Not Applicable Date Met:  20/35/59  Problem: Phase I Progression Outcomes Goal: Ventricular heart rate < 120/min Outcome: Completed/Met Date Met:  05/12/14 Goal: Anticoagulation Therapy per MD order Outcome: Completed/Met Date Met:  05/12/14 Goal: Heart rate or rhythm control medication Outcome: Completed/Met Date Met:  05/12/14 Goal: Pain controlled with appropriate interventions Outcome: Completed/Met Date Met:  05/12/14 Goal: Initial discharge plan identified Outcome: Progressing

## 2014-05-12 NOTE — Progress Notes (Addendum)
   05/12/14 1600  Clinical Encounter Type  Visited With Health care provider  Visit Type Initial   Chaplain was referred to patient via spiritual care consult. Chaplain spoke to patient's nurse who explained that Theda BelfastBob Hamilton, the director of the spiritual care department, had been by and addressed the needs in the consult. Page Merrilyn Puman-Call Chaplain if any follow up is necessary. Cranston NeighborStrother, Cesar Rogerson R, Chaplain  4:50 PM

## 2014-05-12 NOTE — Progress Notes (Signed)
Dr. Joseph ArtWoods notified of pt having coughing spells after eating and/or drinking.  Also notified of tachypnea and increased heart rate to 120s. Pt made npo. Will continue to monitor pt closely.

## 2014-05-13 LAB — COMPREHENSIVE METABOLIC PANEL
ALT: 33 U/L (ref 0–35)
ANION GAP: 17 — AB (ref 5–15)
AST: 27 U/L (ref 0–37)
Albumin: 3 g/dL — ABNORMAL LOW (ref 3.5–5.2)
Alkaline Phosphatase: 84 U/L (ref 39–117)
BUN: 7 mg/dL (ref 6–23)
CO2: 26 mEq/L (ref 19–32)
Calcium: 9 mg/dL (ref 8.4–10.5)
Chloride: 91 mEq/L — ABNORMAL LOW (ref 96–112)
Creatinine, Ser: 0.76 mg/dL (ref 0.50–1.10)
GFR calc Af Amer: >90 mL/min (ref >90)
GFR calc non Af Amer: 85 mL/min — ABNORMAL LOW (ref >90)
Glucose, Bld: 106 mg/dL — ABNORMAL HIGH (ref 70–99)
Potassium: 3.2 mEq/L — ABNORMAL LOW (ref 3.7–5.3)
SODIUM: 134 meq/L — AB (ref 137–147)
TOTAL PROTEIN: 6.5 g/dL (ref 6.0–8.3)
Total Bilirubin: 0.7 mg/dL (ref 0.3–1.2)

## 2014-05-13 LAB — CBC
HEMATOCRIT: 40 % (ref 36.0–46.0)
Hemoglobin: 13.4 g/dL (ref 12.0–15.0)
MCH: 29.9 pg (ref 26.0–34.0)
MCHC: 33.5 g/dL (ref 30.0–36.0)
MCV: 89.3 fL (ref 78.0–100.0)
Platelets: 263 10*3/uL (ref 150–400)
RBC: 4.48 MIL/uL (ref 3.87–5.11)
RDW: 15 % (ref 11.5–15.5)
WBC: 7.2 10*3/uL (ref 4.0–10.5)

## 2014-05-13 LAB — MAGNESIUM: Magnesium: 1.8 mg/dL (ref 1.5–2.5)

## 2014-05-13 LAB — HEPARIN LEVEL (UNFRACTIONATED): Heparin Unfractionated: 0.58 IU/mL (ref 0.30–0.70)

## 2014-05-13 MED ORDER — CARVEDILOL 12.5 MG PO TABS
12.5000 mg | ORAL_TABLET | Freq: Two times a day (BID) | ORAL | Status: DC
  Administered 2014-05-13 – 2014-05-16 (×6): 12.5 mg via ORAL
  Filled 2014-05-13 (×7): qty 1

## 2014-05-13 MED ORDER — DIGOXIN 250 MCG PO TABS
0.2500 mg | ORAL_TABLET | Freq: Three times a day (TID) | ORAL | Status: AC
  Administered 2014-05-13 (×3): 0.25 mg via ORAL
  Filled 2014-05-13 (×4): qty 1

## 2014-05-13 MED ORDER — MAGNESIUM OXIDE 400 (241.3 MG) MG PO TABS
400.0000 mg | ORAL_TABLET | Freq: Two times a day (BID) | ORAL | Status: AC
  Administered 2014-05-14: 400 mg via ORAL
  Filled 2014-05-13 (×2): qty 1

## 2014-05-13 MED ORDER — DIGOXIN 125 MCG PO TABS
0.1250 mg | ORAL_TABLET | Freq: Every day | ORAL | Status: DC
  Administered 2014-05-14 – 2014-05-16 (×3): 0.125 mg via ORAL
  Filled 2014-05-13 (×3): qty 1

## 2014-05-13 MED ORDER — MAGNESIUM SULFATE 2 GM/50ML IV SOLN
2.0000 g | Freq: Once | INTRAVENOUS | Status: AC
  Administered 2014-05-13: 2 g via INTRAVENOUS
  Filled 2014-05-13: qty 50

## 2014-05-13 MED ORDER — POTASSIUM CHLORIDE CRYS ER 20 MEQ PO TBCR
20.0000 meq | EXTENDED_RELEASE_TABLET | Freq: Two times a day (BID) | ORAL | Status: DC
  Administered 2014-05-13 – 2014-05-16 (×7): 20 meq via ORAL
  Filled 2014-05-13 (×6): qty 1

## 2014-05-13 MED ORDER — LISINOPRIL 2.5 MG PO TABS
2.5000 mg | ORAL_TABLET | Freq: Every day | ORAL | Status: DC
  Administered 2014-05-13 – 2014-05-14 (×2): 2.5 mg via ORAL
  Filled 2014-05-13 (×2): qty 1

## 2014-05-13 MED ORDER — POTASSIUM CHLORIDE CRYS ER 20 MEQ PO TBCR
40.0000 meq | EXTENDED_RELEASE_TABLET | Freq: Two times a day (BID) | ORAL | Status: AC
  Administered 2014-05-13 (×2): 40 meq via ORAL
  Filled 2014-05-13 (×2): qty 2

## 2014-05-13 NOTE — Evaluation (Signed)
Clinical/Bedside Swallow Evaluation Patient Details  Name: Rebecca FlirtLinda Namba MRN: [id number] Date of Birth: 06/18/1945  Today's Date: 05/13/2014 Time: 0830-0840 SLP Time Calculation (min) (ACUTE ONLY): 10 min  Past Medical History:  Past Medical History  Diagnosis Date  . Hypertension   . Anxiety    Past Surgical History: History reviewed. No pertinent past surgical history. HPI:  Rebecca Huerta is a 68 y.o. WF PMHx anxiety, alcohol abuse,acute on chronic systolic CHF, HTN,who was sent in by her primary care physician for bilateral lower extremity swelling and new onset atrial fib with rates into the 130s. Patient has symptoms of shortness of breath and fatigue that started about 2-3 weeks ago. When she went to her PCP today, she was found to be in new onset A. Fib and she was sent to the ER. Patient admits to drinking vodka every day. Pt with frequent coughing, specifically observed when drinking.    Assessment / Plan / Recommendation Clinical Impression  Pt does not demonstrate any evidence of aspiration or dysphagia. Recommend pt resume a regular texture, thin liquid diet. No SLP f/u needed, will sign off.     Aspiration Risk  Mild    Diet Recommendation Regular;Thin liquid   Liquid Administration via: Cup;Straw Medication Administration: Whole meds with liquid Supervision: Patient able to self feed Postural Changes and/or Swallow Maneuvers: Seated upright 90 degrees    Other  Recommendations Oral Care Recommendations: Oral care BID   Follow Up Recommendations       Frequency and Duration        Pertinent Vitals/Pain NA    SLP Swallow Goals     Swallow Study Prior Functional Status       General HPI: Rebecca FlirtLinda Orona is a 68 y.o. WF PMHx anxiety, alcohol abuse,acute on chronic systolic CHF, HTN,who was sent in by her primary care physician for bilateral lower extremity swelling and new onset atrial fib with rates into the 130s. Patient has symptoms of shortness of breath and  fatigue that started about 2-3 weeks ago. When she went to her PCP today, she was found to be in new onset A. Fib and she was sent to the ER. Patient admits to drinking vodka every day. Pt with frequent coughing, specifically observed when drinking.  Type of Study: Bedside swallow evaluation Previous Swallow Assessment: none Diet Prior to this Study: NPO Temperature Spikes Noted: No Respiratory Status: Nasal cannula History of Recent Intubation: No Behavior/Cognition: Alert;Cooperative;Pleasant mood Oral Cavity - Dentition: Adequate natural dentition Self-Feeding Abilities: Able to feed self Patient Positioning: Upright in bed Baseline Vocal Quality: Clear Volitional Cough: Strong Volitional Swallow: Able to elicit    Oral/Motor/Sensory Function Overall Oral Motor/Sensory Function: Appears within functional limits for tasks assessed   Ice Chips     Thin Liquid Thin Liquid: Within functional limits    Nectar Thick Nectar Thick Liquid: Not tested   Honey Thick Honey Thick Liquid: Not tested   Puree Puree: Within functional limits   Solid   GO    Solid: Within functional limits      South[facility] Medical Center LLCBonnie Dakarai Mcglocklin, MA CCC-SLP [phone]  Claudine MoutonDeBlois, Jaleya Pebley Caroline 05/13/2014,9:01 AM

## 2014-05-13 NOTE — Consult Note (Signed)
Name: Rebecca Huerta is a 68 y.o. female Admit date: 05/11/2014 Referring Physician:  Marlin Canary, M.D.;  Primary Physician:  Marjory Lies, M.D. Primary Cardiologist:  Gwynneth Albright, M.D.  Reason for Consultation:  A. fib with RVR  ASSESSMENT: 1. Atrial fibrillation with rapid ventricular response. Duration of the arrhythmia is unknown but likely present for weeks to months. 2. Acute systolic heart failure. Etiology is uncertain but the most obvious explanation would be tachycardia induced systolic dysfunction. Rule out alcohol toxicity, coronary artery disease, and hypertension as alternative or contributory explanations. The normal LV size on echo goes against hypertension, infarction, and alcohol toxicity. 3. Alcoholism 4. Tobacco abuse 5. Probable psychiatric illness 6. Hypokalemia  PLAN: 1. Anticoagulation with heparin and convert to oral anticoagulant. She will be high risk for bleeding if she continues to drink alcohol daily. I would favor a NOAC over Coumadin . 2. Atrial fibrillation rate control with beta blocker therapy. Add digoxin therapy for additional rate control. Agree with discontinuation of diltiazem given the significant reduction in systolic function. 3. Conversion to normal sinus rhythm after 3-4 weeks of therapeutic anticoagulation. 4. Ischemic evaluation, but in the absence of chest discomfort or elevated markers, I would delay this until heart failure is adequately managed and atrial fibrillation is under control. This could conceivably be done as an outpatient. I don't believe he necessarily needs coronary angiography unless a myocardial perfusion study is suggestive of prior infarction/ongoing ischemia. 5. Diuresis to clear lower extremity edema and pleural effusions. Adding spironolactone may be helpful, especially given the hypokalemia. 6. Replete potassium 7. Prolonged conversation with the patient and daughter concerning the interrelationship between  atrial fibrillation, systolic heart failure, and alcoholism. Total time spent with the patient was greater than 90 minutes.   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 She has a greater than 40 year history daily alcohol use. She drinks vodka greater than 2 cocktails per day. She smokes cigarettes. After retiring 3 months ago she began having episodes of anxiety which she describes as shortness of breath and the sense that her heart was racing when she would perform certain activities. These "anxiety attacks" would not occur spontaneously without physical provocation. No associated chest discomfort. No prior history of heart disease. Over the one month prior to admission she began noticing lower extremity edema and increasing abdominal girth. There was progressive exertional dyspnea. She eventually saw her physician concerning these complaints and was found to have tachycardia and sent the emergency room. 6 or 8 weeks ago she complained of these episodes of anxiety, and her primary physician started antidepressant therapy. The antidepressant therapy seemed to aggravate her condition according to the daughter. She states that the medication caused her to have a shuffling gait and slurred speech. She additionally began developing confusion. She is currently without complaints of dyspnea, chest pain, or "anxiety".  PMH:   Past Medical History  Diagnosis Date  . Hypertension   . Anxiety     PSH:  History reviewed. No pertinent past surgical history. Allergies:  Penicillins Prior to Admit Meds:   Prescriptions prior to admission  Medication Sig Dispense Refill Last Dose  . ALPRAZolam (XANAX) 0.5 MG tablet Take 0.5 mg by mouth every 6 (six) hours as needed.  0 05/10/2014 at Unknown time  . amLODipine (NORVASC) 5 MG tablet Take 5 mg by mouth every other day.  0 05/11/2014 at Unknown time  . busPIRone (BUSPAR) 5 MG tablet  Take 5 mg by mouth 3 (three)  times daily.  0 05/10/2014 at Unknown time  . escitalopram (LEXAPRO) 20 MG tablet Take 20 mg by mouth at bedtime.  0 05/10/2014 at Unknown time  . fluticasone (FLONASE) 50 MCG/ACT nasal spray Place 2 sprays into both nostrils daily.  1 05/10/2014 at Unknown time  . lisinopril-hydrochlorothiazide (PRINZIDE,ZESTORETIC) 20-12.5 MG per tablet Take 1 tablet by mouth every other day.  0 05/11/2014 at Unknown time  . PROAIR HFA 108 (90 BASE) MCG/ACT inhaler Inhale 2 puffs into the lungs every 4 (four) hours as needed.  0 Past Week at Unknown time   Fam HX:   History reviewed. No pertinent family history. Social HX:    History   Social History  . Marital Status: Married    Spouse Name: N/A    Number of Children: N/A  . Years of Education: N/A   Occupational History  . Not on file.   Social History Main Topics  . Smoking status: Current Every Day Smoker -- 1.00 packs/day    Types: Cigarettes  . Smokeless tobacco: Not on file  . Alcohol Use: 12.6 oz/week    21 Shots of liquor per week  . Drug Use: No  . Sexual Activity: Not Currently   Other Topics Concern  . Not on file   Social History Narrative  . No narrative on file     Review of Systems: There is no history of intestinal bleeding, ulcer, asthma, nausea, vomiting, melena, transient neurological abnormality, rhythm disturbance, claudication, or weight loss. She has poor appetite. Her daughter feels that alcohol suppresses her need to eat. She has not had hemoptysis. Weight is been stable. No difficulty swallowing. She denies early satiety.  Physical Exam: Blood pressure 112/87, pulse 86, temperature 98.3 F (36.8 C), temperature source Oral, resp. rate 20, height 5\' 4"  (1.626 m), weight 139 lb 12.8 oz (63.413 kg), SpO2 92 %. Weight change: -13 lb 3.2 oz (-5.987 kg)   The patient is lying flat in her bed. She denies dyspnea and appears comfortable. She has closely cropped hair with multiple tattoos and piercings. She has a  masculine presents. HEENT exam reveals no jaundice. There is no pallor. Extraocular movements are full. Neck exam reveals no JVD or carotid bruits. Thyroid is not palpable. Chest is clear anteriorly. There is marked reduction in breath sounds at both bases, left greater than right Cardiac exam reveals an irregular rhythm, slightly tachycardic. No obvious murmur. No gallop is heard. Abdomen is soft. The liver edge is not palpable. There is no tenderness or obvious ascites. Extremities reveal radial pulses that are 2+. Lower extremities reveal 2+ edema that is pitting. Dorsalis pedis pulses are 2+. Neurological exam does not reveal focal abnormality. Her affect is flat with easy tendency towards anger.  Labs: Lab Results  Component Value Date   WBC 7.2 05/13/2014   HGB 13.4 05/13/2014   HCT 40.0 05/13/2014   MCV 89.3 05/13/2014   PLT 263 05/13/2014    Recent Labs Lab 05/12/14 2100  NA 136*  K 3.2*  CL 94*  CO2 26  BUN 9  CREATININE 0.84  CALCIUM 8.8  PROT 6.4  BILITOT 0.7  ALKPHOS 81  ALT 35  AST 31  GLUCOSE 101*   No results found for: PTT Lab Results  Component Value Date   INR 1.26 05/11/2014   Lab Results  Component Value Date   TROPONINI <0.30 05/12/2014    No results found for: CHOL No  results found for: HDL No results found for: LDLCALC No results found for: TRIG No results found for: CHOLHDL No results found for: LDLDIRECT    Radiology:  Dg Chest Port 1 View  05/12/2014   CLINICAL DATA:  Cough and shortness of breath.  EXAM: PORTABLE CHEST - 1 VIEW  COMPARISON:  Single view of chest 05/11/2014.  FINDINGS: Bilateral pleural effusions and basilar airspace disease, worse on the left, are again seen and not notably changed. No pneumothorax identified. Heart size is upper normal.  IMPRESSION: No change in left greater than right pleural effusions and basilar airspace disease.   Electronically Signed   By: Drusilla Kannerhomas  Dalessio M.D.   On: 05/12/2014 18:55   Dg Chest  Port 1 View  05/11/2014   CLINICAL DATA:  Cardiac palpitations/tachycardia  EXAM: PORTABLE CHEST - 1 VIEW  COMPARISON:  None.  FINDINGS: There is a moderate pleural effusion on the left with left lower lobe consolidation. There is a minimal effusion on the right. Elsewhere lungs are clear. Heart is enlarged with pulmonary vascularity within normal limits. No adenopathy. There is atherosclerotic change in the aortic arch.  IMPRESSION: Moderate pleural effusion on the left with a lower lobe consolidation. Minimal effusion on the right. Cardiomegaly present.   Electronically Signed   By: Bretta BangWilliam  Woodruff M.D.   On: 05/11/2014 13:15    EKG:  From 05/11/14 demonstrating QS pattern V1 through V4 and also inferior Q waves with interventricular conduction delay. Cannot exclude possible inferior and anterior infarction of unknown age. Atrial fibrillation (coarse) with rapid ventricular response.  ECHOCARDIOGRAM: 05/12/14 Study Conclusions  - Left ventricle: The cavity size was normal. Wall thickness was increased in a pattern of mild LVH. Systolic function was severely reduced. The estimated ejection fraction was in the range of 25% to 30%. Diffuse hypokinesis. - Mitral valve: There was severe regurgitation. - Left atrium: The atrium was moderately to severely dilated. - Tricuspid valve: There was moderate regurgitation. - Pulmonary arteries: PA peak pressure: 35 mm Hg (S). - Pericardium, extracardiac: A trivial pericardial effusion was identified. There was a left pleural effusion.  Lesleigh NoeSMITH III,HENRY W 05/13/2014 10:28 AM

## 2014-05-13 NOTE — Progress Notes (Addendum)
[facility] TEAM 1 - Stepdown/ICU TEAM Progress Note  Khadeja Abt QQV:[id number] DOB: 08/30/1945 DOA: 05/11/2014 PCP: Delorse Lek, MD  Admit HPI / Brief Narrative: Rebecca Huerta is a 68 y.o. WF PMHx  anxiety, alcohol abuse,acute on chronic systolic CHF, HTN. She was sent to the ER by her primary care physician for bilateral lower extremity swelling and new onset atrial fib with rates into the 130s. Patient had symptoms of shortness of breath and fatigue that started about 2-3 prior to presentation. Patient denied ever having irregular heart rate previously. She did admit to drinking vodka every day. Amount varies. Patient also smokes cigarettes. She denied illicit drugs.  In the ER patient was started on Cardizem drip after which her HR decreased to the 90s.Her CHAD2-vasc score was of at least 3 but given her alcoholism unclear if she is Huerta appropriate anticoagulation candidate  Since admission Huerta echocardiogram has been completed that reveals moderate mitral regurgitation and severe systolic dysfunction with Huerta EF of 25-30% and diffuse hypokinesis. Cardiology's been consulted.   HPI/Subjective: Alert and states breathing is better. Asking when she can go home. Today he is minimizing alcohol intake stating that she doesn't drink that much and barely drinks 1 alcoholic beverage per day.  Assessment/Plan:  New onset A. fib:  -Most likely multifactorial alcohol abuse, acute on chronic decompensated CHF.  -Cardizem infusion titrated down to 5 mg per hour; given new finding of systolic dysfunction will discontinue in favor of carvedilol alone-Will increase carvedilol dose -Continue heparin drip -unclear if appropriate candidate for long-term anticoagulation in setting of ongoing alcohol abuse-patient does not have signs of cirrhosis-cardiology favors NOAC as opposed to Coumadin  -Case management consult to explore cost of NOAC -Digoxin added 12/2 by cardiology -TSH within normal limit    -Troponin 3 negative  New acute systolic heart failure/EF 25-30%/Pleural effusion -Diuresing well with Lasix-associated hypokalemia so add scheduled potassium -Unclear if depressed systolic function primarily a result of persistent tachycardia or if related to underlying CAD -Cardiology consulted -Ischemic evaluation planned but deferred until heart failure adequately managed and atrial fibrillation well controlled noting could be done as Huerta outpatient; daughter has concerns of patient compliance and follow-up if deferred to outpatient evaluation -Continue ACE inhibitor -Cardiology considering adding spironolactone -Also has left greater than right pleural effusion-continue diuresis-repeat two-view chest x-ray 12/3; may require therapeutic thoracentesis  HTN  -Continue carvedilol and ACE inhibitor  Pulmonary hypertension  -As above     Hypomagnesemia -Magnesium was 1.6 on 12/1 and bolus dose was given -Repeat level on 12/2 still low at 1.8 -Given low potassium and low EF patient at risk for developing nonsustained ventricular tachycardia so need to keep potassium greater than 4 and magnesium greater than 2  Alcohol abuse:  -cont CIWA  -UDS negative -Patient counseled on cessation as well as increased risk of developing cirrhosis if alcohol abuse continues -Current LFTs are normal the patient does have hypoalbuminemia  Tobacco abuse:  -Patient counseled on need to stop smoking, not interested in stopping; declines nicotine patch at this time  Hypokalemia: -Replete BID -Goal maintain potassium>4  Hypomagnesemia -Replete -Goal maintain magnesium> 2  Hyponatremia: -Related to volume volume overload -Sodium increasing with diuresis    Code Status: FULL Family Communication: Daughter at bedside Disposition Plan: Stepdown    Consultants: Cardiology/Dr. Katrinka Blazing  Procedure/Significant Events: 11/30 PCXR; Moderate pleural effusion on the left with a lower lobe  consolidation. Minimal effusion on right  12/1 echocardiogram;Left ventricle: mild LVH. Systolic function severely reduced.  -  LVEF= 25% to 30%. Diffuse hypokinesis. - Mitral valve: There was severe regurgitation. - Left atrium: The atrium was moderately to severely dilated. - Tricuspid valve: There was moderate regurgitation. - Pulmonary arteries: PA peak pressure: 35 mm Hg (S). - Pericardium, extracardiac: A trivial pericardial effusion was identified. There was a left pleural effusion.   Culture None  Antibiotics: None  DVT prophylaxis: Heparin drip    Continuous Infusions: . sodium chloride 5 mL/hr at 05/12/14 2000  . heparin 1,000 Units/hr (05/12/14 1317)    Objective: VITAL SIGNS: Temp: 97.8 F (36.6 C) (12/02 1159) Temp Source: Oral (12/02 1159) BP: 92/59 mmHg (12/02 1400) Pulse Rate: 77 (12/02 1400) SPO2; FIO2:   Intake/Output Summary (Last 24 hours) at 05/13/14 1459 Last data filed at 05/13/14 1400  Gross per 24 hour  Intake 863.83 ml  Output   4625 ml  Net -3761.17 ml     Exam: General: No acute respiratory distress Lungs: Bilateral lung sounds with basilar crackles, room air Cardiovascular: Irregular irregular rhythm and rate, without murmur gallop or rub normal S1 and S2 Abdomen: Nontender, nondistended, soft, bowel sounds positive, no rebound, no ascites, no appreciable mass Extremities: No significant cyanosis, clubbing, bilateral lower extremity edema 2+    Data Reviewed: Basic Metabolic Panel:  Recent Labs Lab 05/11/14 1339 05/12/14 0500 05/12/14 2100 05/13/14 1052  NA 129* 132* 136* 134*  K 3.2* 3.5* 3.2* 3.2*  CL 90* 93* 94* 91*  CO2 25 27 26 26   GLUCOSE 108* 96 101* 106*  BUN 7 8 9 7   CREATININE 0.63 0.78 0.84 0.76  CALCIUM 8.5 8.4 8.8 9.0  MG  --   --  1.6 1.8   Liver Function Tests:  Recent Labs Lab 05/12/14 2100 05/13/14 1052  AST 31 27  ALT 35 33  ALKPHOS 81 84  BILITOT 0.7 0.7  PROT 6.4 6.5  ALBUMIN 3.0*  3.0*   No results for input(s): LIPASE, AMYLASE in the last 168 hours. No results for input(s): AMMONIA in the last 168 hours. CBC:  Recent Labs Lab 05/11/14 1505 05/12/14 0500 05/13/14 0410  WBC 7.8 7.3 7.2  NEUTROABS 6.2  --   --   HGB 13.2 12.4 13.4  HCT 39.1 37.0 40.0  MCV 88.3 89.2 89.3  PLT 252 225 263   Cardiac Enzymes:  Recent Labs Lab 05/11/14 1811 05/11/14 2323 05/12/14 0500  TROPONINI <0.30 <0.30 <0.30   BNP (last 3 results)  Recent Labs  05/11/14 1339  PROBNP 9349.0*   CBG: No results for input(s): GLUCAP in the last 168 hours.  Recent Results (from the past 240 hour(s))  MRSA PCR Screening     Status: None   Collection Time: 05/11/14  4:53 PM  Result Value Ref Range Status   MRSA by PCR NEGATIVE NEGATIVE Final    Comment:        The GeneXpert MRSA Assay (FDA approved for NASAL specimens only), is one component of a comprehensive MRSA colonization surveillance program. It is not intended to diagnose MRSA infection nor to guide or monitor treatment for MRSA infections.      Studies:  Recent x-ray studies have been reviewed in detail by the Attending Physician  Scheduled Meds:  Scheduled Meds: . busPIRone  5 mg Oral TID  . carvedilol  12.5 mg Oral BID WC  . [START ON 05/14/2014] digoxin  0.125 mg Oral Daily  . digoxin  0.25 mg Oral TID  . escitalopram  20 mg Oral QHS  . fluticasone  2 spray Each Nare Daily  . folic acid  1 mg Oral Daily  . furosemide  60 mg Intravenous BID  . lisinopril  2.5 mg Oral Daily  . LORazepam  0-4 mg Intravenous Q4H   Followed by  . [START ON 05/14/2014] LORazepam  0-4 mg Intravenous Q12H  . nicotine  14 mg Transdermal Daily  . potassium chloride  20 mEq Oral BID  . potassium chloride  40 mEq Oral BID  . sodium chloride  3 mL Intravenous Q12H  . sodium chloride  3 mL Intravenous Q12H  . thiamine  100 mg Oral Daily    Time spent on care of this patient: 40 mins  I have seen and examined the patient ,  reviewed plan of care as documented by APP.   Richarda Overlieayana Jhada Risk [phone]      Russella DarELLIS,ALLISON L. , ANP  [facility] Office  [phone] Pager - [phone]  On-Call/Text Page:      [email or web address]      password [email or web address]  If 7PM-7AM, please contact night-coverage [email or web address] Password [email or web address] 05/13/2014, 2:59 PM   LOS: 2 days

## 2014-05-13 NOTE — Progress Notes (Signed)
ANTICOAGULATION CONSULT NOTE Pharmacy Consult for heparin Indication: atrial fibrillation   Allergies  Allergen Reactions  . Penicillins Rash    Patient Measurements: Height: 5\' 4"  (162.6 cm) Weight: 139 lb 12.8 oz (63.413 kg) IBW/kg (Calculated) : 54.7 Heparin Dosing Weight: 68 kg  Vital Signs: Temp: 98.3 F (36.8 C) (12/02 0805) Temp Source: Oral (12/02 0805) BP: 112/87 mmHg (12/02 0800) Pulse Rate: 86 (12/02 0800)  Labs:  Recent Labs  05/11/14 1339  05/11/14 1505 05/11/14 1600 05/11/14 1811 05/11/14 2323 05/12/14 0500 05/12/14 2100 05/13/14 0410  HGB  --   < > 13.2  --   --   --  12.4  --  13.4  HCT  --   --  39.1  --   --   --  37.0  --  40.0  PLT  --   --  252  --   --   --  225  --  263  LABPROT  --   --   --  15.9*  --   --   --   --   --   INR  --   --   --  1.26  --   --   --   --   --   HEPARINUNFRC  --   --   --   --   --  0.47 0.47  --  0.58  CREATININE 0.63  --   --   --   --   --  0.78 0.84  --   TROPONINI  --   --   --   --  <0.30 <0.30 <0.30  --   --   < > = values in this interval not displayed.  Estimated Creatinine Clearance: 55.4 mL/min (by C-G formula based on Cr of 0.84).  Assessment: 68 yo f who presented to the ED on 11/30 for bilateral lower leg swelling. Pharmacy is consulted to begin heparin for new onset afib.   PMH: Afib, EtOH/tobacco abuse, chronic CHF, HTN, anxiety  Anticoagulation:Patient is not on AC PTA.  Heparin within goal.  No bleeding noted.  H/H wnl, plt wnl.   Cardiovascular: CHF (EF 25-30%), Afib - BP within goal, HR 80-100s.  Trop x 1 neg.  Wt down 16 lb.   On furosemide 60 mg BID (80 mg x 1), coreg 6.25 mg, diltiazem gtt 5mg /hr.    Nephrology: Crcl ~60-65 mL/min, K < goal 4 - supped , Mg < goal 2 - supped (as of 12/1)  Goal of Therapy:  Heparin level 0.3-0.7 units/ml Monitor platelets by anticoagulation protocol: Yes   Plan:  - Continue heparin infusion at 1,000 units/hr - Daily HL and CBC - Monitor  hgb/plts, s/s of bleeding, clinical course  Red ChristiansSamson Essex Perry, Pharm. D. Clinical Pharmacy Resident Pager: [phone] Ph: [phone] 05/13/2014 9:01 AM

## 2014-05-14 ENCOUNTER — Inpatient Hospital Stay (HOSPITAL_COMMUNITY): Payer: Medicare HMO

## 2014-05-14 DIAGNOSIS — I270 Primary pulmonary hypertension: Secondary | ICD-10-CM

## 2014-05-14 DIAGNOSIS — I272 Pulmonary hypertension, unspecified: Secondary | ICD-10-CM | POA: Insufficient documentation

## 2014-05-14 LAB — BASIC METABOLIC PANEL
Anion gap: 10 (ref 5–15)
BUN: 9 mg/dL (ref 6–23)
CALCIUM: 8.6 mg/dL (ref 8.4–10.5)
CO2: 31 mEq/L (ref 19–32)
Chloride: 97 mEq/L (ref 96–112)
Creatinine, Ser: 0.99 mg/dL (ref 0.50–1.10)
GFR, EST AFRICAN AMERICAN: 66 mL/min — AB (ref >90)
GFR, EST NON AFRICAN AMERICAN: 57 mL/min — AB (ref >90)
Glucose, Bld: 92 mg/dL (ref 70–99)
POTASSIUM: 4.3 meq/L (ref 3.7–5.3)
SODIUM: 138 meq/L (ref 137–147)

## 2014-05-14 LAB — CBC
HEMATOCRIT: 38.3 % (ref 36.0–46.0)
Hemoglobin: 12.7 g/dL (ref 12.0–15.0)
MCH: 30 pg (ref 26.0–34.0)
MCHC: 33.2 g/dL (ref 30.0–36.0)
MCV: 90.3 fL (ref 78.0–100.0)
PLATELETS: 222 10*3/uL (ref 150–400)
RBC: 4.24 MIL/uL (ref 3.87–5.11)
RDW: 15.1 % (ref 11.5–15.5)
WBC: 6.1 10*3/uL (ref 4.0–10.5)

## 2014-05-14 LAB — MAGNESIUM: MAGNESIUM: 2.1 mg/dL (ref 1.5–2.5)

## 2014-05-14 LAB — HEPARIN LEVEL (UNFRACTIONATED): Heparin Unfractionated: 0.62 IU/mL (ref 0.30–0.70)

## 2014-05-14 LAB — ALBUMIN: ALBUMIN: 2.9 g/dL — AB (ref 3.5–5.2)

## 2014-05-14 MED ORDER — HEPARIN (PORCINE) IN NACL 100-0.45 UNIT/ML-% IJ SOLN
1000.0000 [IU]/h | INTRAMUSCULAR | Status: AC
  Administered 2014-05-14: 1000 [IU]/h via INTRAVENOUS

## 2014-05-14 MED ORDER — LISINOPRIL 5 MG PO TABS
5.0000 mg | ORAL_TABLET | Freq: Every day | ORAL | Status: DC
  Administered 2014-05-15: 5 mg via ORAL
  Filled 2014-05-14: qty 1

## 2014-05-14 MED ORDER — RIVAROXABAN 15 MG PO TABS
15.0000 mg | ORAL_TABLET | Freq: Every day | ORAL | Status: DC
  Administered 2014-05-14 – 2014-05-15 (×2): 15 mg via ORAL
  Filled 2014-05-14 (×2): qty 1

## 2014-05-14 NOTE — Progress Notes (Signed)
[facility] TEAM 1 - Stepdown/ICU TEAM Progress Note  Rebecca FlirtLinda Upshur [id number] DOB: 10/30/1945 DOA: 05/11/2014 PCP: Delorse LekBURNETT,BRENT A, MD  Admit HPI / Brief Narrative: Rebecca Huerta is a 68 y.o. WF PMHx  anxiety, alcohol abuse,acute on chronic systolic CHF, HTN, Who was sent in by her primary care physician for bilateral lower extremity swelling and new onset atrial fib with rates into the 130s. Patient has symptoms of shortness of breath and fatigue that started about 2-3 weeks ago. When she went to her PCP today, she was found to be in new onset A. Fib and she was sent to the ER. Patient denies ever having irregular heart rate previously. Patient admits to drinking vodka every day. Amount varies. Patient also smokes cigarettes. She denies illicit drugs. No chills no dizziness no blurred vision. Daughter states the patient has been having some slurred speech but this apparently is due to the alcohol consumption.  in the ER patient was started on Cardizem drip which has controlled her heart rate into the 90s.  CHAD2-vasc of at least 3  HPI/Subjective: 12/3 A/O 4, states SOB has improved, maintains that she does not drink as much as daughter has been stating. Negative CP, negative SOB, negative N/V  symptoms.   Assessment/Plan:  New onset A. fib:  -Most likely multifactorial alcohol abuse, acute on chronic decompensated CHF.  -Per cardiology start Xarelto; counseled patient and daughter extensively that anticoagulation and alcohol would result in a significant increase for bleeding to include hemorrhagic stroke.  -TSH within normal limit  -Troponin 3 negative -Continue digoxin 0.125 mg daily -Continue Coreg 12.5 mg BID -Continue lisinopril 5 mg daily  Systolic CHF (most likely  acute on chronic) -Continue Lasix 60 mg BID -Cardiology consult in the a.m., patient has never seen a cardiologist. -Daily weight; admission weight= 69.4 kg      12/3 weight= 62.5 kg  -Strict in and out;  since admission -4.8 L  HTN  -See new onset A. fib   Pulmonary hypertension  -See systolic CHF, HTN   Pleural effusion -Lt>>>Rt; 12/3 PCXR shows some clearing of effusion and pulmonary edema  -Continue aggressive diuresis    Coughing spasms; aspiration? -Significantly improved with diuresis  -Passed swallow eval; regular thin liquid    Pulmonary edema along with lower extremity edema: -See systolic CHF, and echocardiogram results below -Continued pedal edema but improved  Alcohol abuse:  -CIWA  -UDS negative  Tobacco abuse:  -Patient counseled on need to stop smoking, not interested in stopping; declines nicotine patch at this time  Hypo-kalemia: -Replete -Goal maintain potassium>4  Hypomagnesemia -Replete -Goal maintain magnesium> 2  Hyponatremia: -Probably due to alcohol consumption resolved with diuresis   HTN - See atrial fibrillation   Code Status: FULL Family Communication: Daughter and best friend present at time of exam Disposition Plan: Resolution CHF    Consultants: Dr.Peter SwazilandJordan (Cardiology)    Procedure/Significant Events: 11/30 PCXR; Moderate pleural effusion on the left with a lower lobe consolidation. Minimal effusion on right 12/1 echocardiogram;Left ventricle: mild LVH. Systolic function severely reduced.  -LVEF= 25% to 30%. Diffuse hypokinesis. - Mitral valve: There was severe regurgitation. - Left atrium: The atrium was moderately to severely dilated. - Tricuspid valve: There was moderate regurgitation. - Pulmonary arteries: PA peak pressure: 35 mm Hg (S). - Pericardium, extracardiac: A trivial pericardial effusion was identified. There was a left pleural effusion.   Culture   Antibiotics:   DVT prophylaxis: Xarelto   Devices    LINES / TUBES:  Continuous Infusions: . sodium chloride 5 mL/hr at 05/12/14 2000  . heparin 1,000 Units/hr (05/13/14 1702)    Objective: VITAL SIGNS: Temp: 98.1 F (36.7 C)  (12/03 0700) Temp Source: Oral (12/03 0700) BP: 125/80 mmHg (12/03 0700) Pulse Rate: 61 (12/03 0700) SPO2; FIO2:   Intake/Output Summary (Last 24 hours) at 05/14/14 0816 Last data filed at 05/14/14 0700  Gross per 24 hour  Intake    710 ml  Output   1250 ml  Net   -540 ml     Exam: General: A/O 4, NAD, No acute respiratory distress Lungs: Decreased breath sounds lingula/LLL/RLL, however able to hear some air movement, diffuse rhonchi remainder of lung improved from previous exam, Cardiovascular: Irregular irregular rhythm and rate, without murmur gallop or rub normal S1 and S2 Abdomen: Nontender, nondistended, soft, bowel sounds positive, no rebound, no ascites, no appreciable mass Extremities: No significant cyanosis, clubbing, bilateral pitting edema to waist 2+      Data Reviewed: Basic Metabolic Panel:  Recent Labs Lab 05/11/14 1339 05/12/14 0500 05/12/14 2100 05/13/14 1052 05/14/14 0237  NA 129* 132* 136* 134* 138  K 3.2* 3.5* 3.2* 3.2* 4.3  CL 90* 93* 94* 91* 97  CO2 25 27 26 26 31   GLUCOSE 108* 96 101* 106* 92  BUN 7 8 9 7 9   CREATININE 0.63 0.78 0.84 0.76 0.99  CALCIUM 8.5 8.4 8.8 9.0 8.6  MG  --   --  1.6 1.8 2.1   Liver Function Tests:  Recent Labs Lab 05/12/14 2100 05/13/14 1052  AST 31 27  ALT 35 33  ALKPHOS 81 84  BILITOT 0.7 0.7  PROT 6.4 6.5  ALBUMIN 3.0* 3.0*   No results for input(s): LIPASE, AMYLASE in the last 168 hours. No results for input(s): AMMONIA in the last 168 hours. CBC:  Recent Labs Lab 05/11/14 1505 05/12/14 0500 05/13/14 0410 05/14/14 0237  WBC 7.8 7.3 7.2 6.1  NEUTROABS 6.2  --   --   --   HGB 13.2 12.4 13.4 12.7  HCT 39.1 37.0 40.0 38.3  MCV 88.3 89.2 89.3 90.3  PLT 252 225 263 222   Cardiac Enzymes:  Recent Labs Lab 05/11/14 1811 05/11/14 2323 05/12/14 0500  TROPONINI <0.30 <0.30 <0.30   BNP (last 3 results)  Recent Labs  05/11/14 1339  PROBNP 9349.0*   CBG: No results for input(s): GLUCAP  in the last 168 hours.  Recent Results (from the past 240 hour(s))  MRSA PCR Screening     Status: None   Collection Time: 05/11/14  4:53 PM  Result Value Ref Range Status   MRSA by PCR NEGATIVE NEGATIVE Final    Comment:        The GeneXpert MRSA Assay (FDA approved for NASAL specimens only), is one component of a comprehensive MRSA colonization surveillance program. It is not intended to diagnose MRSA infection nor to guide or monitor treatment for MRSA infections.      Studies:  Recent x-ray studies have been reviewed in detail by the Attending Physician  Scheduled Meds:  Scheduled Meds: . busPIRone  5 mg Oral TID  . carvedilol  12.5 mg Oral BID WC  . digoxin  0.125 mg Oral Daily  . escitalopram  20 mg Oral QHS  . fluticasone  2 spray Each Nare Daily  . folic acid  1 mg Oral Daily  . furosemide  60 mg Intravenous BID  . lisinopril  2.5 mg Oral Daily  . LORazepam  0-4  mg Intravenous Q4H   Followed by  . LORazepam  0-4 mg Intravenous Q12H  . magnesium oxide  400 mg Oral BID  . nicotine  14 mg Transdermal Daily  . potassium chloride  20 mEq Oral BID  . sodium chloride  3 mL Intravenous Q12H  . sodium chloride  3 mL Intravenous Q12H  . thiamine  100 mg Oral Daily    Time spent on care of this patient: 40 mins   Drema DallasWOODS, Teila Skalsky, J , MD   [facility] Office  [phone] Pager - [phone]  On-Call/Text Page:      [email or web address]      password [email or web address]  If 7PM-7AM, please contact night-coverage [email or web address] Password [email or web address] 05/14/2014, 8:16 AM   LOS: 3 days

## 2014-05-14 NOTE — Progress Notes (Addendum)
ANTICOAGULATION CONSULT NOTE - Initial Consult  Pharmacy Consult for Xarelto Indication: atrial fibrillation  Allergies  Allergen Reactions  . Penicillins Rash    Patient Measurements: Height: 5\' 4"  (162.6 cm) Weight: 137 lb 12.6 oz (62.5 kg) IBW/kg (Calculated) : 54.7 Heparin Dosing Weight: 68 kg  Vital Signs: Temp: 98.1 F (36.7 C) (12/03 0700) Temp Source: Oral (12/03 0700) BP: 131/106 mmHg (12/03 0800) Pulse Rate: 105 (12/03 0800)  Labs:  Recent Labs  05/11/14 1600 05/11/14 1811  05/11/14 2323 05/12/14 0500 05/12/14 2100 05/13/14 0410 05/13/14 1052 05/14/14 0237  HGB  --   --   --   --  12.4  --  13.4  --  12.7  HCT  --   --   --   --  37.0  --  40.0  --  38.3  PLT  --   --   --   --  225  --  263  --  222  LABPROT 15.9*  --   --   --   --   --   --   --   --   INR 1.26  --   --   --   --   --   --   --   --   HEPARINUNFRC  --   --   < > 0.47 0.47  --  0.58  --  0.62  CREATININE  --   --   --   --  0.78 0.84  --  0.76 0.99  TROPONINI  --  <0.30  --  <0.30 <0.30  --   --   --   --   < > = values in this interval not displayed.  Estimated Creatinine Clearance: 47 mL/min (by C-G formula based on Cr of 0.99).   Medical History: Past Medical History  Diagnosis Date  . Hypertension   . Anxiety     Assessment: [age] yoF who presented to the ED on 11/30 for bilateral lower leg swelling found to be in Afib not on anticoagulation prior to arrival. She was started on heparin and was therapeutic at 0.62 this morning at a rate of 1000 units/hr. Hemoglobin and platelets are WNL. SCr trending up. No signs of bleeding noted. Will continue heparin until Xarelto is started tonight at 1700.   Goal of Therapy:  Monitor platelets by anticoagulation protocol: Yes  Monitor for signs and symptoms of bleeding   Plan:  - Continue heparin infusion 1000 units/hr until 1700, then start Rivaroxaban 15 mg daily with meals - Dose adjusted for CrCl < 50 mL/min - Monitor hgb/plts, s/s  of bleeding, clinical course  Russ HaloAshley Marilyn Wing, PharmD Clinical Pharmacist - Resident Pager: [phone] 12/3/201511:57 AM

## 2014-05-14 NOTE — Plan of Care (Signed)
Problem: Consults Goal: Atrial Arhythmia Patient Education (See Patient Education module for education specifics.)  Outcome: Progressing  Problem: Phase I Progression Outcomes Goal: Other Phase I Outcomes/Goals Outcome: Not Applicable Date Met:  83/38/25  Problem: Phase II Progression Outcomes Goal: Ventricular heart rate < 100/min Outcome: Completed/Met Date Met:  05/14/14 Goal: Progress activity as tolerated unless otherwise ordered Outcome: Progressing Pt now getting OOB to [facility] with only one stand-by assist. HR remains stable during activity. Pt denies SOB, CP, or palpitations.

## 2014-05-14 NOTE — Discharge Instructions (Signed)

## 2014-05-14 NOTE — Progress Notes (Signed)
TELEMETRY: Reviewed telemetry pt in atrial fibrillation with a controlled rate.: Filed Vitals:   05/14/14 0500 05/14/14 0600 05/14/14 0700 05/14/14 0800  BP: 125/89 149/112 125/80 131/106  Pulse: 90 76 61 105  Temp:   98.1 F (36.7 C)   TempSrc:   Oral   Resp: 20 25 21 13   Height:      Weight:      SpO2: 97% 95% 99% 99%    Intake/Output Summary (Last 24 hours) at 05/14/14 1124 Last data filed at 05/14/14 1100  Gross per 24 hour  Intake    650 ml  Output   1101 ml  Net   -451 ml   Filed Weights   05/12/14 0500 05/13/14 0408 05/14/14 0420  Weight: 155 lb 3.2 oz (70.398 kg) 139 lb 12.8 oz (63.413 kg) 137 lb 12.6 oz (62.5 kg)    Subjective Denies any SOB, chest pain, or palpitations. States its "too early" to tell how she is doing.   . busPIRone  5 mg Oral TID  . carvedilol  12.5 mg Oral BID WC  . digoxin  0.125 mg Oral Daily  . escitalopram  20 mg Oral QHS  . fluticasone  2 spray Each Nare Daily  . folic acid  1 mg Oral Daily  . furosemide  60 mg Intravenous BID  . lisinopril  2.5 mg Oral Daily  . LORazepam  0-4 mg Intravenous Q4H   Followed by  . LORazepam  0-4 mg Intravenous Q12H  . magnesium oxide  400 mg Oral BID  . nicotine  14 mg Transdermal Daily  . potassium chloride  20 mEq Oral BID  . sodium chloride  3 mL Intravenous Q12H  . sodium chloride  3 mL Intravenous Q12H  . thiamine  100 mg Oral Daily   . sodium chloride 5 mL/hr at 05/12/14 2000  . heparin 1,000 Units/hr (05/13/14 1702)    LABS: Basic Metabolic Panel:  Recent Labs  16/10/96 1052 05/14/14 0237  NA 134* 138  K 3.2* 4.3  CL 91* 97  CO2 26 31  GLUCOSE 106* 92  BUN 7 9  CREATININE 0.76 0.99  CALCIUM 9.0 8.6  MG 1.8 2.1   Liver Function Tests:  Recent Labs  05/12/14 2100 05/13/14 1052 05/14/14 0933  AST 31 27  --   ALT 35 33  --   ALKPHOS 81 84  --   BILITOT 0.7 0.7  --   PROT 6.4 6.5  --   ALBUMIN 3.0* 3.0* 2.9*   No results for input(s): LIPASE, AMYLASE in the last 72  hours. CBC:  Recent Labs  05/11/14 1505  05/13/14 0410 05/14/14 0237  WBC 7.8  < > 7.2 6.1  NEUTROABS 6.2  --   --   --   HGB 13.2  < > 13.4 12.7  HCT 39.1  < > 40.0 38.3  MCV 88.3  < > 89.3 90.3  PLT 252  < > 263 222  < > = values in this interval not displayed. Cardiac Enzymes:  Recent Labs  05/11/14 1811 05/11/14 2323 05/12/14 0500  TROPONINI <0.30 <0.30 <0.30   BNP:  Recent Labs  05/11/14 1339  PROBNP 9349.0*   D-Dimer: No results for input(s): DDIMER in the last 72 hours. Hemoglobin A1C: No results for input(s): HGBA1C in the last 72 hours. Fasting Lipid Panel: No results for input(s): CHOL, HDL, LDLCALC, TRIG, CHOLHDL, LDLDIRECT in the last 72 hours. Thyroid Function Tests:  Recent Labs  05/11/14  1811  TSH 3.260     Radiology/Studies:  Dg Chest Port 1 View  05/14/2014   CLINICAL DATA:  Pleural effusion.  EXAM: PORTABLE CHEST - 1 VIEW  COMPARISON:  05/11/2014.  FINDINGS: Mediastinum and hilar structures are normal. Cardiomegaly with mild pulmonary vascular prominence and interstitial prominence. Small bilateral pleural effusions. These findings are consistent congestive heart failure. Basilar pneumonia cannot be excluded. No pneumothorax. No acute osseus abnormality.  IMPRESSION: 1. Congestive heart failure from interstitial edema bilateral pleural effusions. Similar findings noted on prior exam. 2. Bibasilar pneumonia cannot be excluded.   Electronically Signed   By: Maisie Fushomas  Register   On: 05/14/2014 07:33   Dg Chest Port 1 View  05/12/2014   CLINICAL DATA:  Cough and shortness of breath.  EXAM: PORTABLE CHEST - 1 VIEW  COMPARISON:  Single view of chest 05/11/2014.  FINDINGS: Bilateral pleural effusions and basilar airspace disease, worse on the left, are again seen and not notably changed. No pneumothorax identified. Heart size is upper normal.  IMPRESSION: No change in left greater than right pleural effusions and basilar airspace disease.   Electronically  Signed   By: Drusilla Kannerhomas  Dalessio M.D.   On: 05/12/2014 18:55   Ecg: Atrial fibrillation, LAD, nonspecific IVCD  Echo: Study Conclusions  - Left ventricle: The cavity size was normal. Wall thickness was increased in a pattern of mild LVH. Systolic function was severely reduced. The estimated ejection fraction was in the range of 25% to 30%. Diffuse hypokinesis. - Mitral valve: There was severe regurgitation. - Left atrium: The atrium was moderately to severely dilated. - Tricuspid valve: There was moderate regurgitation. - Pulmonary arteries: PA peak pressure: 35 mm Hg (S). - Pericardium, extracardiac: A trivial pericardial effusion was identified. There was a left pleural effusion.  PHYSICAL EXAM General: Elderly WF in NAD Head: Normocephalic, atraumatic, sclera non-icteric, oropharynx is clear Neck: Negative for carotid bruits. JVD not elevated. No adenopathy Lungs: Decreased BS in both bases.  Breathing is unlabored. Heart: RRR S1 S2 without murmurs, rubs, or gallops.  Abdomen: Soft, non-tender, non-distended with normoactive bowel sounds. No hepatomegaly. No rebound/guarding. No obvious abdominal masses. Msk:  Strength and tone appears normal for age. Extremities: 1+ pitting edema.  Distal pedal pulses are 2+ and equal bilaterally. Neuro: Alert and oriented X 3. Moves all extremities spontaneously. Psych:  Flat affect.  ASSESSMENT AND PLAN: 1. Acute systolic CHF. EF 25-30%. Improving with diuresis. I/O negative 5 liters since admit. Weight down. CXR still shows edema and effusions. Still has pitting edema. Continue IV lasix. Also on dig, coreg, and lisinopril. Will increase lisinopril to 5 mg daily. 2. Atrial fibrillation with RVR. Rate control improved with dig and coreg. On IV heparin. Will transition to oral anticoagulation with Xarelto. She is a poor candidate for coumadin with Etoh history and possible cirrhosis. Plan long term rate control and anticoagulation. She is a  poor candidate for DCCV due to severe MR and marked LA enlargement. It is very unlikely she would maintain NSR. 3. Etoh abuse. 4. Tobacco abuse.  5. Hypokalemia- repleted.   I had a long discussion with daughter and patient. Daughter states that patient has consistently stated she will not stop drinking Etoh or stop smoking. I think her compliance with medical therapy is very tenuous. I would not recommend further ischemic work up at this time since I think it is unlikely to change our therapy. I would optimize medical therapy for CHF. Stop Etoh. Repeat Echo in 3 months to  reassess LV function and MR. Would consider outpatient myoview if patient will show compliance with medical therapy and follow up. Overall prognosis is poor.  Present on Admission:  . A-fib . Edema leg . Pulmonary edema . Tobacco abuse . Alcohol abuse . Acute on chronic systolic CHF (congestive heart failure) . Benign essential HTN . Anxiety state . Atrial fibrillation with RVR . Postoperative pulmonary edema . Pleural effusion . Hypokalemia . Hyponatremia  Signed, Peter [place]Jordan, MDFACC 05/14/2014 11:24 AM

## 2014-05-15 DIAGNOSIS — I480 Paroxysmal atrial fibrillation: Secondary | ICD-10-CM

## 2014-05-15 LAB — CBC
HCT: 40.4 % (ref 36.0–46.0)
Hemoglobin: 13.3 g/dL (ref 12.0–15.0)
MCH: 29.8 pg (ref 26.0–34.0)
MCHC: 32.9 g/dL (ref 30.0–36.0)
MCV: 90.4 fL (ref 78.0–100.0)
PLATELETS: 210 10*3/uL (ref 150–400)
RBC: 4.47 MIL/uL (ref 3.87–5.11)
RDW: 15 % (ref 11.5–15.5)
WBC: 6.1 10*3/uL (ref 4.0–10.5)

## 2014-05-15 LAB — BASIC METABOLIC PANEL
Anion gap: 14 (ref 5–15)
BUN: 11 mg/dL (ref 6–23)
CO2: 27 meq/L (ref 19–32)
Calcium: 9.1 mg/dL (ref 8.4–10.5)
Chloride: 96 mEq/L (ref 96–112)
Creatinine, Ser: 0.79 mg/dL (ref 0.50–1.10)
GFR calc non Af Amer: 84 mL/min — ABNORMAL LOW (ref >90)
Glucose, Bld: 118 mg/dL — ABNORMAL HIGH (ref 70–99)
Potassium: 4 mEq/L (ref 3.7–5.3)
Sodium: 137 mEq/L (ref 137–147)

## 2014-05-15 LAB — MAGNESIUM: Magnesium: 1.6 mg/dL (ref 1.5–2.5)

## 2014-05-15 MED ORDER — LISINOPRIL 10 MG PO TABS
10.0000 mg | ORAL_TABLET | Freq: Every day | ORAL | Status: DC
  Administered 2014-05-16: 10 mg via ORAL
  Filled 2014-05-15: qty 1

## 2014-05-15 MED ORDER — MAGNESIUM SULFATE 2 GM/50ML IV SOLN
INTRAVENOUS | Status: AC
  Filled 2014-05-15: qty 50

## 2014-05-15 MED ORDER — FUROSEMIDE 40 MG PO TABS
40.0000 mg | ORAL_TABLET | Freq: Two times a day (BID) | ORAL | Status: DC
  Administered 2014-05-15 – 2014-05-16 (×2): 40 mg via ORAL
  Filled 2014-05-15: qty 1

## 2014-05-15 MED ORDER — MAGNESIUM SULFATE 2 GM/50ML IV SOLN
2.0000 g | Freq: Once | INTRAVENOUS | Status: AC
  Administered 2014-05-15: 2 g via INTRAVENOUS

## 2014-05-15 NOTE — Progress Notes (Signed)
[place] TEAM 1 - Stepdown/ICU TEAM Progress Note  Rebecca FlirtLinda Herda ZOX:[id number] DOB: 08/28/1945 DOA: 05/11/2014 PCP: Delorse LekBURNETT,BRENT A, MD  Admit HPI / Brief Narrative: Rebecca Huerta is a 68 y.o. WF PMHx  anxiety, alcohol abuse,acute on chronic systolic CHF, HTN, Who was sent in by her primary care physician for bilateral lower extremity swelling and new onset atrial fib with rates into the 130s. Patient has symptoms of shortness of breath and fatigue that started about 2-3 weeks ago. When she went to her PCP today, she was found to be in new onset A. Fib and she was sent to the ER. Patient denies ever having irregular heart rate previously. Patient admits to drinking vodka every day. Amount varies. Patient also smokes cigarettes. She denies illicit drugs. No chills no dizziness no blurred vision. Daughter states the patient has been having some slurred speech but this apparently is due to the alcohol consumption.  in the ER patient was started on Cardizem drip which has controlled her heart rate into the 90s.  CHAD2-vasc of at least 3  HPI/Subjective: 12/3 A/O 4, states SOB has improved, maintains that she does not drink as much as daughter has been stating. Negative CP, negative SOB, negative N/V  symptoms.   Assessment/Plan:  New onset A. fib:  -Most likely multifactorial alcohol abuse, acute on chronic decompensated CHF.  -Per cardiology continue Xarelto; counseled patient and daughter extensively that anticoagulation and alcohol would result in a significant increase for bleeding to include hemorrhagic stroke.  -TSH within normal limit  -Troponin 3 negative -Continue digoxin 0.125 mg daily -Continue Coreg 12.5 mg BID -Continue lisinopril 10 mg daily  Systolic CHF (most likely  acute on chronic) -Continue Lasix 40 mg BID -Cardiology consult in the a.m., patient has never seen a cardiologist. -Daily weight; admission weight= 69.4 kg      12/3 weight= 59.8 kg  -Strict in and  out; since admission -4.6 L  HTN  -See new onset A. fib   Pulmonary hypertension  -See systolic CHF, HTN   Pleural effusion -Lt>>>Rt; 12/3 PCXR shows some clearing of effusion and pulmonary edema  -Continue aggressive diuresis -A.m. PCXR    Coughing spasms; aspiration? -Significantly improved with diuresis  -Passed swallow eval; heart healthy diet regular thin liquid    Pulmonary edema along with lower extremity edema: -See systolic CHF, and echocardiogram results below -Continued pedal edema but improved significantly  Alcohol abuse:  -CIWA  -UDS negative  Tobacco abuse:  -Patient counseled on need to stop smoking, patient believes can stop cold [place]. Prior to discharge will offer again help in stopping her smoking.  Hypo-kalemia: -Replete -Goal maintain potassium>4  Hypomagnesemia -Replete 2 g IV -Goal maintain magnesium> 2  Hyponatremia: -Probably due to alcohol consumption resolved with diuresis   HTN - See atrial fibrillation   Code Status: FULL Family Communication: No family present at time of exam Disposition Plan: Resolution CHF    Consultants: Dr.Peter SwazilandJordan (Cardiology)    Procedure/Significant Events: 11/30 PCXR; Moderate pleural effusion on the left with a lower lobe consolidation. Minimal effusion on right 12/1 echocardiogram;Left ventricle: mild LVH. Systolic function severely reduced.  -LVEF= 25% to 30%. Diffuse hypokinesis. - Mitral valve: There was severe regurgitation. - Left atrium: The atrium was moderately to severely dilated. - Tricuspid valve: There was moderate regurgitation. - Pulmonary arteries: PA peak pressure: 35 mm Hg (S). - Pericardium, extracardiac: A trivial pericardial effusion was identified. There was a left pleural effusion.   Culture   Antibiotics:  DVT prophylaxis: Xarelto   Devices    LINES / TUBES:      Continuous Infusions: . sodium chloride 5 mL/hr at 05/12/14 2000     Objective: VITAL SIGNS: Temp: 97.5 F (36.4 C) (12/04 1200) Temp Source: Oral (12/04 1200) BP: 123/90 mmHg (12/04 1200) Pulse Rate: 94 (12/04 0356) SPO2; FIO2:   Intake/Output Summary (Last 24 hours) at 05/15/14 1431 Last data filed at 05/15/14 0900  Gross per 24 hour  Intake    420 ml  Output      0 ml  Net    420 ml     Exam: General: A/O 4, NAD, No acute respiratory distress Lungs: Clear to auscultation bilateral mild rhonchi remainder Cardiovascular: Irregular irregular rhythm and rate, without murmur gallop or rub normal S1 and S2 Abdomen: Nontender, nondistended, soft, bowel sounds positive, no rebound, no ascites, no appreciable mass Extremities: No significant cyanosis, clubbing, bilateral pitting edema to waist 1+      Data Reviewed: Basic Metabolic Panel:  Recent Labs Lab 05/11/14 1339 05/12/14 0500 05/12/14 2100 05/13/14 1052 05/14/14 0237  NA 129* 132* 136* 134* 138  K 3.2* 3.5* 3.2* 3.2* 4.3  CL 90* 93* 94* 91* 97  CO2 25 27 26 26 31   GLUCOSE 108* 96 101* 106* 92  BUN 7 8 9 7 9   CREATININE 0.63 0.78 0.84 0.76 0.99  CALCIUM 8.5 8.4 8.8 9.0 8.6  MG  --   --  1.6 1.8 2.1   Liver Function Tests:  Recent Labs Lab 05/12/14 2100 05/13/14 1052 05/14/14 0933  AST 31 27  --   ALT 35 33  --   ALKPHOS 81 84  --   BILITOT 0.7 0.7  --   PROT 6.4 6.5  --   ALBUMIN 3.0* 3.0* 2.9*   No results for input(s): LIPASE, AMYLASE in the last 168 hours. No results for input(s): AMMONIA in the last 168 hours. CBC:  Recent Labs Lab 05/11/14 1505 05/12/14 0500 05/13/14 0410 05/14/14 0237 05/15/14 0330  WBC 7.8 7.3 7.2 6.1 6.1  NEUTROABS 6.2  --   --   --   --   HGB 13.2 12.4 13.4 12.7 13.3  HCT 39.1 37.0 40.0 38.3 40.4  MCV 88.3 89.2 89.3 90.3 90.4  PLT 252 225 263 222 210   Cardiac Enzymes:  Recent Labs Lab 05/11/14 1811 05/11/14 2323 05/12/14 0500  TROPONINI <0.30 <0.30 <0.30   BNP (last 3 results)  Recent Labs  05/11/14 1339   PROBNP 9349.0*   CBG: No results for input(s): GLUCAP in the last 168 hours.  Recent Results (from the past 240 hour(s))  MRSA PCR Screening     Status: None   Collection Time: 05/11/14  4:53 PM  Result Value Ref Range Status   MRSA by PCR NEGATIVE NEGATIVE Final    Comment:        The GeneXpert MRSA Assay (FDA approved for NASAL specimens only), is one component of a comprehensive MRSA colonization surveillance program. It is not intended to diagnose MRSA infection nor to guide or monitor treatment for MRSA infections.      Studies:  Recent x-ray studies have been reviewed in detail by the Attending Physician  Scheduled Meds:  Scheduled Meds: . busPIRone  5 mg Oral TID  . carvedilol  12.5 mg Oral BID WC  . digoxin  0.125 mg Oral Daily  . escitalopram  20 mg Oral QHS  . fluticasone  2 spray Each Nare Daily  .  folic acid  1 mg Oral Daily  . furosemide  40 mg Oral BID  . [START ON 05/16/2014] lisinopril  10 mg Oral Daily  . LORazepam  0-4 mg Intravenous Q12H  . nicotine  14 mg Transdermal Daily  . potassium chloride  20 mEq Oral BID  . rivaroxaban  15 mg Oral Q supper  . sodium chloride  3 mL Intravenous Q12H  . sodium chloride  3 mL Intravenous Q12H  . thiamine  100 mg Oral Daily    Time spent on care of this patient: 40 mins   Drema Dallas , MD   [facility] Office  [phone] Pager - [phone]  On-Call/Text Page:      [email or web address]      password [email or web address]  If 7PM-7AM, please contact night-coverage [email or web address] Password [email or web address] 05/15/2014, 2:31 PM   LOS: 4 days

## 2014-05-15 NOTE — Progress Notes (Signed)
TELEMETRY: Reviewed telemetry pt in atrial fibrillation with a controlled rate.: Filed Vitals:   05/15/14 0356 05/15/14 0400 05/15/14 0448 05/15/14 0748  BP: 132/89   146/73  Pulse: 94     Temp:  97.9 F (36.6 C)  97.7 F (36.5 C)  TempSrc:  Oral  Oral  Resp:    16  Height:      Weight:   131 lb 13.4 oz (59.8 kg)   SpO2: 97%   97%    Intake/Output Summary (Last 24 hours) at 05/15/14 1019 Last data filed at 05/15/14 0900  Gross per 24 hour  Intake    610 ml  Output    300 ml  Net    310 ml   Filed Weights   05/13/14 0408 05/14/14 0420 05/15/14 0448  Weight: 139 lb 12.8 oz (63.413 kg) 137 lb 12.6 oz (62.5 kg) 131 lb 13.4 oz (59.8 kg)    Subjective Denies any SOB, chest pain, or palpitations. Feels well. Anxious to go home.  . busPIRone  5 mg Oral TID  . carvedilol  12.5 mg Oral BID WC  . digoxin  0.125 mg Oral Daily  . escitalopram  20 mg Oral QHS  . fluticasone  2 spray Each Nare Daily  . folic acid  1 mg Oral Daily  . furosemide  60 mg Intravenous BID  . lisinopril  5 mg Oral Daily  . LORazepam  0-4 mg Intravenous Q12H  . nicotine  14 mg Transdermal Daily  . potassium chloride  20 mEq Oral BID  . rivaroxaban  15 mg Oral Q supper  . sodium chloride  3 mL Intravenous Q12H  . sodium chloride  3 mL Intravenous Q12H  . thiamine  100 mg Oral Daily   . sodium chloride 5 mL/hr at 05/12/14 2000    LABS: Basic Metabolic Panel:  Recent Labs  16/03/9611/02/15 1052 05/14/14 0237  NA 134* 138  K 3.2* 4.3  CL 91* 97  CO2 26 31  GLUCOSE 106* 92  BUN 7 9  CREATININE 0.76 0.99  CALCIUM 9.0 8.6  MG 1.8 2.1   Liver Function Tests:  Recent Labs  05/12/14 2100 05/13/14 1052 05/14/14 0933  AST 31 27  --   ALT 35 33  --   ALKPHOS 81 84  --   BILITOT 0.7 0.7  --   PROT 6.4 6.5  --   ALBUMIN 3.0* 3.0* 2.9*   No results for input(s): LIPASE, AMYLASE in the last 72 hours. CBC:  Recent Labs  05/14/14 0237 05/15/14 0330  WBC 6.1 6.1  HGB 12.7 13.3  HCT 38.3 40.4   MCV 90.3 90.4  PLT 222 210   Cardiac Enzymes: No results for input(s): CKTOTAL, CKMB, CKMBINDEX, TROPONINI in the last 72 hours. BNP: No results for input(s): PROBNP in the last 72 hours. D-Dimer: No results for input(s): DDIMER in the last 72 hours. Hemoglobin A1C: No results for input(s): HGBA1C in the last 72 hours. Fasting Lipid Panel: No results for input(s): CHOL, HDL, LDLCALC, TRIG, CHOLHDL, LDLDIRECT in the last 72 hours. Thyroid Function Tests: No results for input(s): TSH, T4TOTAL, T3FREE, THYROIDAB in the last 72 hours.  Invalid input(s): FREET3   Radiology/Studies:  Dg Chest Port 1 View  05/14/2014   CLINICAL DATA:  Pleural effusion.  EXAM: PORTABLE CHEST - 1 VIEW  COMPARISON:  05/11/2014.  FINDINGS: Mediastinum and hilar structures are normal. Cardiomegaly with mild pulmonary vascular prominence and interstitial prominence. Small bilateral pleural effusions.  These findings are consistent congestive heart failure. Basilar pneumonia cannot be excluded. No pneumothorax. No acute osseus abnormality.  IMPRESSION: 1. Congestive heart failure from interstitial edema bilateral pleural effusions. Similar findings noted on prior exam. 2. Bibasilar pneumonia cannot be excluded.   Electronically Signed   By: Maisie Fushomas  Register   On: 05/14/2014 07:33   Ecg: Atrial fibrillation, LAD, nonspecific IVCD  Echo: Study Conclusions  - Left ventricle: The cavity size was normal. Wall thickness was increased in a pattern of mild LVH. Systolic function was severely reduced. The estimated ejection fraction was in the range of 25% to 30%. Diffuse hypokinesis. - Mitral valve: There was severe regurgitation. - Left atrium: The atrium was moderately to severely dilated. - Tricuspid valve: There was moderate regurgitation. - Pulmonary arteries: PA peak pressure: 35 mm Hg (S). - Pericardium, extracardiac: A trivial pericardial effusion was identified. There was a left pleural  effusion.  PHYSICAL EXAM General: Elderly WF in NAD Head: Normocephalic, atraumatic, sclera non-icteric, oropharynx is clear Neck: Negative for carotid bruits. JVD not elevated. No adenopathy Lungs: Decreased BS in left base.  Breathing is unlabored. Heart: RRR S1 S2 without murmurs, rubs, or gallops.  Abdomen: Soft, non-tender, non-distended with normoactive bowel sounds. No hepatomegaly. No rebound/guarding. No obvious abdominal masses. Msk:  Strength and tone appears normal for age. Extremities: no edema.  Distal pedal pulses are 2+ and equal bilaterally. Neuro: Alert and oriented X 3. Moves all extremities spontaneously. Psych:  Flat affect.  ASSESSMENT AND PLAN: 1. Acute systolic CHF. EF 25-30%. Improving with diuresis. I/O negative 5 liters since admit. Weight down significantly. CXR still shows edema and effusions. Still has pitting edema. Will switch lasix to po.  Also on dig, coreg, and lisinopril. Will increase lisinopril to 10 mg daily. OK to transfer to telemetry today. Anticipate DC tomorrow. 2. Atrial fibrillation with RVR. Rate control improved with dig and coreg. On Xarelto She is a poor candidate for coumadin with Etoh history and possible cirrhosis. Plan long term rate control and anticoagulation. She is a poor candidate for DCCV due to severe MR and marked LA enlargement. It is very unlikely she would maintain NSR. 3. Etoh abuse. 4. Tobacco abuse.  5. Hypokalemia- repleted.    I think her compliance with medical therapy is very tenuous. Stressed importance of compliance with medication and follow up.  I would not recommend further ischemic work up at this time since I think it is unlikely to change our therapy. I would optimize medical therapy for CHF. Stop Etoh. Repeat Echo in 3 months to reassess LV function and MR. Would consider outpatient myoview if patient will show compliance with medical therapy and follow up. Overall prognosis is poor.  Present on Admission:  .  A-fib . Edema leg . Pulmonary edema . Tobacco abuse . Alcohol abuse . Acute on chronic systolic CHF (congestive heart failure) . Benign essential HTN . Anxiety state . Atrial fibrillation with RVR . Postoperative pulmonary edema . Pleural effusion . Hypokalemia . Hyponatremia  Signed, Thersa Mohiuddin [place], MDFACC 05/15/2014 10:19 AM

## 2014-05-15 NOTE — Progress Notes (Signed)
Attempted to call report to nurse on 3W. Nurse asked if she could call back in a little bit. Nurse given my number and will await call.

## 2014-05-15 NOTE — Progress Notes (Signed)
12-4 1057a spoke w pt. Gave her 30day free xarelto card. She will have 45.00 per month copay for xarelto.

## 2014-05-16 ENCOUNTER — Inpatient Hospital Stay (HOSPITAL_COMMUNITY): Payer: Medicare HMO

## 2014-05-16 LAB — BASIC METABOLIC PANEL
Anion gap: 12 (ref 5–15)
BUN: 12 mg/dL (ref 6–23)
CALCIUM: 8.9 mg/dL (ref 8.4–10.5)
CO2: 28 meq/L (ref 19–32)
CREATININE: 0.84 mg/dL (ref 0.50–1.10)
Chloride: 96 mEq/L (ref 96–112)
GFR calc Af Amer: 81 mL/min — ABNORMAL LOW (ref >90)
GFR calc non Af Amer: 70 mL/min — ABNORMAL LOW (ref >90)
GLUCOSE: 88 mg/dL (ref 70–99)
Potassium: 4.3 mEq/L (ref 3.7–5.3)
Sodium: 136 mEq/L — ABNORMAL LOW (ref 137–147)

## 2014-05-16 LAB — CBC
HEMATOCRIT: 41.2 % (ref 36.0–46.0)
HEMOGLOBIN: 13.5 g/dL (ref 12.0–15.0)
MCH: 29.8 pg (ref 26.0–34.0)
MCHC: 32.8 g/dL (ref 30.0–36.0)
MCV: 90.9 fL (ref 78.0–100.0)
Platelets: 186 10*3/uL (ref 150–400)
RBC: 4.53 MIL/uL (ref 3.87–5.11)
RDW: 14.9 % (ref 11.5–15.5)
WBC: 7 10*3/uL (ref 4.0–10.5)

## 2014-05-16 LAB — MAGNESIUM: Magnesium: 1.9 mg/dL (ref 1.5–2.5)

## 2014-05-16 MED ORDER — LISINOPRIL 10 MG PO TABS: 10.0000 mg | ORAL_TABLET | Freq: Every day | ORAL | Status: DC

## 2014-05-16 MED ORDER — FOLIC ACID 1 MG PO TABS: 1.0000 mg | ORAL_TABLET | Freq: Every day | ORAL | Status: DC

## 2014-05-16 MED ORDER — FUROSEMIDE 40 MG PO TABS: ORAL_TABLET | ORAL | Status: DC

## 2014-05-16 MED ORDER — POTASSIUM CHLORIDE CRYS ER 20 MEQ PO TBCR: EXTENDED_RELEASE_TABLET | ORAL | Status: DC

## 2014-05-16 MED ORDER — DIGOXIN 125 MCG PO TABS: 0.1250 mg | ORAL_TABLET | Freq: Every day | ORAL | Status: DC

## 2014-05-16 MED ORDER — THIAMINE HCL 100 MG PO TABS: 100.0000 mg | ORAL_TABLET | Freq: Every day | ORAL | Status: DC

## 2014-05-16 MED ORDER — CARVEDILOL 12.5 MG PO TABS: 12.5000 mg | ORAL_TABLET | Freq: Two times a day (BID) | ORAL | Status: DC

## 2014-05-16 MED ORDER — NICOTINE 14 MG/24HR TD PT24: 14.0000 mg | MEDICATED_PATCH | Freq: Every day | TRANSDERMAL | Status: DC

## 2014-05-16 MED ORDER — RIVAROXABAN 15 MG PO TABS: 15.0000 mg | ORAL_TABLET | Freq: Every day | ORAL | Status: DC

## 2014-05-16 NOTE — Plan of Care (Signed)
Problem: Phase III Progression Outcomes Goal: Anticoagulation Therapy per MD order Outcome: Completed/Met Date Met:  05/16/14

## 2014-05-16 NOTE — Plan of Care (Signed)
Problem: Phase III Progression Outcomes Goal: Pain controlled on oral analgesia Outcome: Completed/Met Date Met:  05/16/14 Goal: Tolerating diet Outcome: Completed/Met Date Met:  05/16/14 Goal: Discharge plan remains appropriate-arrangements made Outcome: Completed/Met Date Met:  05/16/14

## 2014-05-16 NOTE — Discharge Summary (Signed)
Physician Discharge Summary  Rebecca FlirtLinda Huerta ZOX:[id number] DOB: 02/07/1946 DOA: 05/11/2014  PCP: Rebecca LekBURNETT,BRENT A, MD  Admit date: 05/11/2014 Discharge date: 05/16/2014  Time spent: 40 minutes  Recommendations for Outpatient Follow-up:  New onset Huerta. fib:  -Most likely multifactorial alcohol abuse, acute on chronic decompensated CHF.  -Per cardiology continue Xarelto; counseled patient and daughter extensively that anticoagulation and alcohol would result in Huerta significant increase for bleeding to include hemorrhagic stroke.  -Continue digoxin 0.125 mg daily -Continue Coreg 12.5 mg BID -Continue lisinopril 10 mg daily -Patient will follow-up in 2 week with Dr. Peter SwazilandJordan (cardiology)  Systolic CHF (most likely acute on chronic) -Counseled patient to weigh herself daily and record in Huerta log. Patient to weigh herself when she arrives home today and use that as her base weight.  -Admission weight= 69.4 kg 12/5 weight= 59.7 kg  -Total output during this admission -4.9 L -Continue Lasix 40 mg Daily; see below instructions for sliding scale Lasix   Sliding scale Lasix: Weigh yourself when you get home (this will be your dry weight), then Daily in the Morning. Your dry weight will be what your scale says on the day you return home..   If you gain more than 3 pounds from dry weight: Increase the Lasix dosing to 40 mg in the morning and 40 mg in the afternoon until weight returns to baseline dry weight.  If weight gain is greater than 5 pounds in 2 days: Increased to Lasix 60 mg twice Huerta day and contact the cardiology office for further assistance if weight does not go down the next day.  If the weight goes down more than 3 pounds from dry weight: Hold Lasix until it returns to baseline dry weight  HTN  -See new onset Huerta. fib   Pulmonary hypertension  -See systolic CHF, HTN   Pleural effusion -Lt>>>Rt; 12/3 PCXR shows some clearing of effusion and pulmonary edema  -Continue  aggressive diuresis -PCP to monitor resolution ofeffusion.   Coughing spasms; aspiration? -Significantly improved with diuresis   Pulmonary edema along with lower extremity edema: -See systolic CHF, and echocardiogram results below -Continued pedal edema but improved significantly, will discharge patient with TED hose knee-high. Counseled patient to wear whenever awake and ensure she removes prior to retiring to sleep  Alcohol abuse:  -During admission was counseled daily on requirement to abstain. Patient felt she did not require outside assistance to cease her alcohol use. , Tobacco abuse:  -During admission was counseled daily on requirement to abstain. Patient felt she did not require outside assistance to cease her cigarette use.  Hypo-kalemia: -Resolved  -PCP and cardiologist to monitor with Huerta Goal maintain potassium>4  Hypomagnesemia -Resolved -PCP and cardiologist to monitor with Huerta Goal maintain magnesium> 2  Hyponatremia: -Resolved    HTN - See atrial fibrillation   Code Status: FULL Family Communication: No family present at time of exam Disposition Plan: Resolution CHF      Discharge Diagnoses:  Principal Problem:   Acute on chronic systolic CHF (congestive heart failure) Active Problems:   Huerta-fib   Edema leg   Pulmonary edema   Tobacco abuse   Alcohol abuse   Benign essential HTN   Anxiety state   Atrial fibrillation with RVR   Postoperative pulmonary edema   Pleural effusion   Hypokalemia   Hyponatremia   Pulmonary hypertension   Discharge Condition: Stable  Diet recommendation: Heart healthy  Filed Weights   05/15/14 0448 05/15/14 2212 05/16/14 0700  Weight: 59.8  kg (131 lb 13.4 oz) 59.24 kg (130 lb 9.6 oz) 59.788 kg (131 lb 12.9 oz)    History of present illness:  Rebecca FlirtLinda Carder is Huerta 68 y.o. WF PMHx anxiety, alcohol abuse,acute on chronic systolic CHF, HTN, Who was sent in by her primary care physician for bilateral lower extremity  swelling and new onset atrial fib with rates into the 130s. Patient has symptoms of shortness of breath and fatigue that started about 2-3 weeks ago. When she went to her PCP today, she was found to be in new onset Huerta. Fib and she was sent to the ER. Patient denies ever having irregular heart rate previously. Patient admits to drinking vodka every day. Amount varies. Patient also smokes cigarettes. She denies illicit drugs. No chills no dizziness no blurred vision. Daughter states the patient has been having some slurred speech but this apparently is due to the alcohol consumption.  in the ER patient was started on Cardizem drip which has controlled her heart rate into the 90s.  CHAD2-vasc of at least 3. During his hospitalization patient was diagnosed with systolic CHF with ejection fraction of 25%-30%, and pulmonary hypertension. During her stay patient was aggressively diuresed which alleviated her symptoms of SOB, and pedal edema. Patient was also counseled on numerous occasions that her smoking and alcohol use would have to stop if she was to remain compensated. Patient was also found to be an atrial fibrillation, most likely secondary to her decompensated heart failure, heavy alcohol use, and smoking. Patient was started on appropriate anticoagulation and counseled that drinking and anticoagulation would increase her risk significantly for hemorrhagic CVA. Cardiology deemed that patient is not Huerta good candidate for DCCV due to severe MR and marked LA enlargement. Per cardiology patient to have Repeat Echo in 3 months to reassess LV function and MR, and possible outpatient myoview.     Consultants: Dr.Peter SwazilandJordan (Cardiology)  Procedure/Significant Events: 11/30 PCXR; Moderate pleural effusion on the left with Huerta lower lobe consolidation. Minimal effusion on right 12/1 echocardiogram;Left ventricle: mild LVH. Systolic function severely reduced.  -LVEF= 25% to 30%. Diffuse hypokinesis. - Mitral  valve: There was severe regurgitation. - Left atrium: The atrium was moderately to severely dilated. - Tricuspid valve: There was moderate regurgitation. - Pulmonary arteries: PA peak pressure: 35 mm Hg (S). - Pericardium, extracardiac: Huerta trivial pericardial effusion was identified. There was Huerta left pleural effusion.   Discharge Exam: Filed Vitals:   05/15/14 1700 05/15/14 1933 05/15/14 2212 05/16/14 0700  BP: 135/93 118/77 134/93 124/98  Pulse:   90 104  Temp:  98 F (36.7 C) 98.2 F (36.8 C) 98.5 F (36.9 C)  TempSrc:  Oral Oral Oral  Resp: 18 16 18 18   Height:   5\' 4"  (1.626 m)   Weight:   59.24 kg (130 lb 9.6 oz) 59.788 kg (131 lb 12.9 oz)  SpO2: 98% 94% 100% 100%    General: Huerta/O 4, NAD, No acute respiratory distress Lungs: Clear to auscultation bilateral mild rhonchi remainder Cardiovascular: Irregular irregular rhythm and rate, without murmur gallop or rub normal S1 and S2 Abdomen: Nontender, nondistended, soft, bowel sounds positive, no rebound, no ascites, no appreciable mass Extremities: No significant cyanosis, clubbing, bilateral pitting edema to waist 1+  Discharge Instructions     Medication List    STOP taking these medications        ALPRAZolam 0.5 MG tablet  Commonly known as:  XANAX     amLODipine 5 MG tablet  Commonly  known as:  NORVASC     lisinopril-hydrochlorothiazide 20-12.5 MG per tablet  Commonly known as:  PRINZIDE,ZESTORETIC      TAKE these medications        busPIRone 5 MG tablet  Commonly known as:  BUSPAR  Take 5 mg by mouth 3 (three) times daily.     carvedilol 12.5 MG tablet  Commonly known as:  COREG  Take 1 tablet (12.5 mg total) by mouth 2 (two) times daily with Huerta meal.     digoxin 0.125 MG tablet  Commonly known as:  LANOXIN  Take 1 tablet (0.125 mg total) by mouth daily.     escitalopram 20 MG tablet  Commonly known as:  LEXAPRO  Take 20 mg by mouth at bedtime.     fluticasone 50 MCG/ACT nasal spray  Commonly  known as:  FLONASE  Place 2 sprays into both nostrils daily.     folic acid 1 MG tablet  Commonly known as:  FOLVITE  Take 1 tablet (1 mg total) by mouth daily.     furosemide 40 MG tablet  Commonly known as:  LASIX  1 tab by mouth daily     lisinopril 10 MG tablet  Commonly known as:  PRINIVIL,ZESTRIL  Take 1 tablet (10 mg total) by mouth daily.     nicotine 14 mg/24hr patch  Commonly known as:  NICODERM CQ - dosed in mg/24 hours  Place 1 patch (14 mg total) onto the skin daily.     potassium chloride SA 20 MEQ tablet  Commonly known as:  K-DUR,KLOR-CON  1 tab by mouth Daily     PROAIR HFA 108 (90 BASE) MCG/ACT inhaler  Generic drug:  albuterol  Inhale 2 puffs into the lungs every 4 (four) hours as needed.     Rivaroxaban 15 MG Tabs tablet  Commonly known as:  XARELTO  Take 1 tablet (15 mg total) by mouth daily with supper.     thiamine 100 MG tablet  Take 1 tablet (100 mg total) by mouth daily.       Allergies  Allergen Reactions  . Penicillins Rash   Follow-up Information    Follow up with Rebecca A, MD In 1 week.   Specialty:  Family Medicine   Why:  Hospital follow-up; decompensated systolic CHF, pulmonary hypertension, new onset atrial fibrillation. Monitor electrolytes and make corrections   Contact information:   [address] [place] [address] [phone]       Follow up with Peter Swaziland, MD. Schedule an appointment as soon as possible for Huerta visit in 2 weeks.   Specialty:  Cardiology   Why:  Hospital follow-up; systolic CHF, new onset atrial fibrillation, pulmonary hypertension   Contact information:   [address] [place] [address] [phone]        The results of significant diagnostics from this hospitalization (including imaging, microbiology, ancillary and laboratory) are listed below for reference.    Significant Diagnostic Studies: Dg Chest Port 1 View  05/16/2014   CLINICAL DATA:  Left  pleural effusion  EXAM: PORTABLE CHEST - 1 VIEW  COMPARISON:  05/12/2014  FINDINGS: Slight decrease in the left effusion with persistent left lower lobe collapse/consolidation. Pneumonia not excluded. Interval improvement in the right effusion. Residual right base atelectasis noted. Background COPD/ emphysema suspected. Atherosclerosis of the aorta. No pneumothorax. Trachea is midline.  IMPRESSION: Smaller left effusion with residual left lower lobe collapse/consolidation.  Nearly resolved right effusion  COPD/  emphysema  Cardiomegaly without CHF   Electronically Signed   By: Ruel Favors M.D.   On: 05/16/2014 08:43   Dg Chest Port 1 View  05/14/2014   CLINICAL DATA:  Pleural effusion.  EXAM: PORTABLE CHEST - 1 VIEW  COMPARISON:  05/11/2014.  FINDINGS: Mediastinum and hilar structures are normal. Cardiomegaly with mild pulmonary vascular prominence and interstitial prominence. Small bilateral pleural effusions. These findings are consistent congestive heart failure. Basilar pneumonia cannot be excluded. No pneumothorax. No acute osseus abnormality.  IMPRESSION: 1. Congestive heart failure from interstitial edema bilateral pleural effusions. Similar findings noted on prior exam. 2. Bibasilar pneumonia cannot be excluded.   Electronically Signed   By: Maisie Fus  Register   On: 05/14/2014 07:33   Dg Chest Port 1 View  05/12/2014   CLINICAL DATA:  Cough and shortness of breath.  EXAM: PORTABLE CHEST - 1 VIEW  COMPARISON:  Single view of chest 05/11/2014.  FINDINGS: Bilateral pleural effusions and basilar airspace disease, worse on the left, are again seen and not notably changed. No pneumothorax identified. Heart size is upper normal.  IMPRESSION: No change in left greater than right pleural effusions and basilar airspace disease.   Electronically Signed   By: Drusilla Kanner M.D.   On: 05/12/2014 18:55   Dg Chest Port 1 View  05/11/2014   CLINICAL DATA:  Cardiac palpitations/tachycardia  EXAM: PORTABLE CHEST -  1 VIEW  COMPARISON:  None.  FINDINGS: There is Huerta moderate pleural effusion on the left with left lower lobe consolidation. There is Huerta minimal effusion on the right. Elsewhere lungs are clear. Heart is enlarged with pulmonary vascularity within normal limits. No adenopathy. There is atherosclerotic change in the aortic arch.  IMPRESSION: Moderate pleural effusion on the left with Huerta lower lobe consolidation. Minimal effusion on the right. Cardiomegaly present.   Electronically Signed   By: Bretta Bang M.D.   On: 05/11/2014 13:15    Microbiology: Recent Results (from the past 240 hour(s))  MRSA PCR Screening     Status: None   Collection Time: 05/11/14  4:53 PM  Result Value Ref Range Status   MRSA by PCR NEGATIVE NEGATIVE Final    Comment:        The GeneXpert MRSA Assay (FDA approved for NASAL specimens only), is one component of Huerta comprehensive MRSA colonization surveillance program. It is not intended to diagnose MRSA infection nor to guide or monitor treatment for MRSA infections.      Labs: Basic Metabolic Panel:  Recent Labs Lab 05/12/14 2100 05/13/14 1052 05/14/14 0237 05/15/14 1514 05/16/14 0311  NA 136* 134* 138 137 136*  K 3.2* 3.2* 4.3 4.0 4.3  CL 94* 91* 97 96 96  CO2 26 26 31 27 28   GLUCOSE 101* 106* 92 118* 88  BUN 9 7 9 11 12   CREATININE 0.84 0.76 0.99 0.79 0.84  CALCIUM 8.8 9.0 8.6 9.1 8.9  MG 1.6 1.8 2.1 1.6 1.9   Liver Function Tests:  Recent Labs Lab 05/12/14 2100 05/13/14 1052 05/14/14 0933  AST 31 27  --   ALT 35 33  --   ALKPHOS 81 84  --   BILITOT 0.7 0.7  --   PROT 6.4 6.5  --   ALBUMIN 3.0* 3.0* 2.9*   No results for input(s): LIPASE, AMYLASE in the last 168 hours. No results for input(s): AMMONIA in the last 168 hours. CBC:  Recent Labs Lab 05/11/14 1505 05/12/14 0500 05/13/14 0410 05/14/14 0237 05/15/14 0330  05/16/14 0311  WBC 7.8 7.3 7.2 6.1 6.1 7.0  NEUTROABS 6.2  --   --   --   --   --   HGB 13.2 12.4 13.4 12.7  13.3 13.5  HCT 39.1 37.0 40.0 38.3 40.4 41.2  MCV 88.3 89.2 89.3 90.3 90.4 90.9  PLT 252 225 263 222 210 186   Cardiac Enzymes:  Recent Labs Lab 05/11/14 1811 05/11/14 2323 05/12/14 0500  TROPONINI <0.30 <0.30 <0.30   BNP: BNP (last 3 results)  Recent Labs  05/11/14 1339  PROBNP 9349.0*   CBG: No results for input(s): GLUCAP in the last 168 hours.     Signed:  Carolyne Littles, MD [facility] [phone] pager

## 2014-05-16 NOTE — Plan of Care (Signed)
Problem: Phase II Progression Outcomes Goal: Progress activity as tolerated unless otherwise ordered Outcome: Completed/Met Date Met:  05/16/14

## 2014-05-16 NOTE — Progress Notes (Signed)
Subjective:  No complaint of shortness of breath, no chest pain, going home today.  Objective:  Vital Signs in the last 24 hours: BP 124/98 mmHg  Pulse 104  Temp(Src) 98.5 F (36.9 C) (Oral)  Resp 18  Ht 5\' 4"  (1.626 m)  Wt 59.788 kg (131 lb 12.9 oz)  BMI 22.61 kg/m2  SpO2 100%  Physical Exam: Elderly white female in no acute distress Lungs:  Clear  Cardiac: Irregular rhythm, normal S1 and S2, no S3, 1 to 2/6 murmur Extremities:  No edema present  Intake/Output from previous day: 12/04 0701 - 12/05 0700 In: 610 [P.O.:560; IV Piggyback:50] Out: 700 [Urine:700] Weight Filed Weights   05/15/14 0448 05/15/14 2212 05/16/14 0700  Weight: 59.8 kg (131 lb 13.4 oz) 59.24 kg (130 lb 9.6 oz) 59.788 kg (131 lb 12.9 oz)    Lab Results: Basic Metabolic Panel:  Recent Labs  16/03/9611/04/15 1514 05/16/14 0311  NA 137 136*  K 4.0 4.3  CL 96 96  CO2 27 28  GLUCOSE 118* 88  BUN 11 12  CREATININE 0.79 0.84    CBC:  Recent Labs  05/15/14 0330 05/16/14 0311  WBC 6.1 7.0  HGB 13.3 13.5  HCT 40.4 41.2  MCV 90.4 90.9  PLT 210 186    BNP    Component Value Date/Time   PROBNP 9349.0* 05/11/2014 1339   Assessment/Plan:  1.  Acute on chronic systolic heart failure clinically improves. 2.  Chronic atrial fibrillation currently rate controlled. 3.  Alcohol and tobacco abuse.  Recommendations:  Plan is for discharge today.  She is clinically improved.  She'll need optimal medical therapy and should have cardiology follow-up in one to 2 weeks.  Thought to be a poor candidate for long-term anticoagulation.     Darden PalmerW. Spencer Neeko Pharo, Jr.  MD [facility] Cardiology  05/16/2014, 10:36 AM

## 2014-05-16 NOTE — Plan of Care (Signed)
Problem: Discharge Progression Outcomes Goal: Discharge plan in place and appropriate Outcome: Completed/Met Date Met:  05/16/14 Goal: Sinus rate/atrial ECG rhythm with HR < 100/min Outcome: Not Met (add Reason) AFIB RATE CONTROLLED, DISCHARGED ON XARELTO Goal: Pain controlled with appropriate interventions Outcome: Completed/Met Date Met:  05/16/14 Goal: Hemodynamically stable Outcome: Completed/Met Date Met:  34/14/43 Goal: Complications resolved/controlled Outcome: Adequate for Discharge Goal: Tolerating diet Outcome: Completed/Met Date Met:  05/16/14 Goal: Activity appropriate for discharge plan Outcome: Completed/Met Date Met:  05/16/14 Goal: INR monitor plan established Outcome: Not Met (add Reason)

## 2014-06-09 ENCOUNTER — Ambulatory Visit (INDEPENDENT_AMBULATORY_CARE_PROVIDER_SITE_OTHER): Payer: Medicare HMO | Admitting: Cardiology

## 2014-06-09 ENCOUNTER — Encounter: Payer: Self-pay | Admitting: Cardiology

## 2014-06-09 VITALS — BP 148/82 | HR 91 | Ht 64.0 in | Wt 127.0 lb

## 2014-06-09 DIAGNOSIS — E46 Unspecified protein-calorie malnutrition: Secondary | ICD-10-CM

## 2014-06-09 DIAGNOSIS — I429 Cardiomyopathy, unspecified: Secondary | ICD-10-CM

## 2014-06-09 DIAGNOSIS — I10 Essential (primary) hypertension: Secondary | ICD-10-CM

## 2014-06-09 DIAGNOSIS — I4892 Unspecified atrial flutter: Secondary | ICD-10-CM

## 2014-06-09 DIAGNOSIS — E876 Hypokalemia: Secondary | ICD-10-CM

## 2014-06-09 DIAGNOSIS — I5022 Chronic systolic (congestive) heart failure: Secondary | ICD-10-CM | POA: Insufficient documentation

## 2014-06-09 DIAGNOSIS — Z720 Tobacco use: Secondary | ICD-10-CM

## 2014-06-09 DIAGNOSIS — R21 Rash and other nonspecific skin eruption: Secondary | ICD-10-CM | POA: Insufficient documentation

## 2014-06-09 DIAGNOSIS — I482 Chronic atrial fibrillation, unspecified: Secondary | ICD-10-CM

## 2014-06-09 DIAGNOSIS — F1010 Alcohol abuse, uncomplicated: Secondary | ICD-10-CM

## 2014-06-09 DIAGNOSIS — Z7901 Long term (current) use of anticoagulants: Secondary | ICD-10-CM | POA: Insufficient documentation

## 2014-06-09 DIAGNOSIS — I5021 Acute systolic (congestive) heart failure: Secondary | ICD-10-CM

## 2014-06-09 MED ORDER — THIAMINE HCL 100 MG PO TABS: 100.0000 mg | ORAL_TABLET | Freq: Every day | ORAL | Status: DC

## 2014-06-09 MED ORDER — RIVAROXABAN 15 MG PO TABS: 15.0000 mg | ORAL_TABLET | Freq: Every day | ORAL | Status: DC

## 2014-06-09 MED ORDER — CARVEDILOL 12.5 MG PO TABS: 12.5000 mg | ORAL_TABLET | Freq: Two times a day (BID) | ORAL | Status: DC

## 2014-06-09 MED ORDER — DIGOXIN 125 MCG PO TABS: 0.1250 mg | ORAL_TABLET | Freq: Every day | ORAL | Status: DC

## 2014-06-09 MED ORDER — FUROSEMIDE 40 MG PO TABS: ORAL_TABLET | ORAL | Status: DC

## 2014-06-09 MED ORDER — BUSPIRONE HCL 5 MG PO TABS: 5.0000 mg | ORAL_TABLET | Freq: Three times a day (TID) | ORAL | Status: DC

## 2014-06-09 MED ORDER — ESCITALOPRAM OXALATE 20 MG PO TABS: 20.0000 mg | ORAL_TABLET | Freq: Every day | ORAL | Status: DC

## 2014-06-09 MED ORDER — FOLIC ACID 1 MG PO TABS: 1.0000 mg | ORAL_TABLET | Freq: Every day | ORAL | Status: DC

## 2014-06-09 MED ORDER — POTASSIUM CHLORIDE CRYS ER 20 MEQ PO TBCR: EXTENDED_RELEASE_TABLET | ORAL | Status: DC

## 2014-06-09 MED ORDER — ALBUTEROL SULFATE HFA 108 (90 BASE) MCG/ACT IN AERS: 2.0000 | INHALATION_SPRAY | RESPIRATORY_TRACT | Status: DC | PRN

## 2014-06-09 MED ORDER — FLUTICASONE PROPIONATE 50 MCG/ACT NA SUSP: 2.0000 | Freq: Every day | NASAL | Status: DC

## 2014-06-09 MED ORDER — LISINOPRIL 10 MG PO TABS: 10.0000 mg | ORAL_TABLET | Freq: Two times a day (BID) | ORAL | Status: DC

## 2014-06-09 NOTE — Assessment & Plan Note (Signed)
She has not smoked tobacco since discharge-she was congratulated

## 2014-06-09 NOTE — Assessment & Plan Note (Signed)
Mild hypertension today will increase lisinopril to 10 mg twice a day second dose bedtime.

## 2014-06-09 NOTE — Assessment & Plan Note (Signed)
She has not had alcohol intake since discharge

## 2014-06-09 NOTE — Assessment & Plan Note (Signed)
She is on Xarelto without complications or bleeding.  She will continue to take.

## 2014-06-09 NOTE — Assessment & Plan Note (Signed)
May have been due to alcohol prior to the hospitalization, her appetite is still decreased we discussed muscle nail which she seems to like and in short

## 2014-06-09 NOTE — Assessment & Plan Note (Signed)
Small area left axillary area. She is been applying cream which she can continue if the rash becomes worse she needs see dermatologist. It may be her medication but currently just a small area.

## 2014-06-09 NOTE — Assessment & Plan Note (Signed)
During hospitalization she was hypokalemic we will recheck today or tomorrow depending on lab availability.

## 2014-06-09 NOTE — Progress Notes (Signed)
06/09/2014   PCP: Delorse LekBURNETT,BRENT A, MD   Chief Complaint  Patient presents with  . Follow-up    post hospital     Primary Cardiologist:New- requesting Dr. Darl [place]holderKoneswaren in [place]  HPI:  Rebecca Huerta is a 68 y.o. WF PMHx anxiety, alcohol abuse,acute on chronic systolic CHF, HTN, who was sent to ED in by her primary care physician for bilateral lower extremity swelling and new onset atrial fib with rates into the 130s. Patient has symptoms of shortness of breath and fatigue that started about 2-3 weeks ago. When she went to her PCP today, she was found to be in new onset A. Fib..  Patient denies ever having irregular heart rate previously.  Pt was placed on cardizem IV and rate was controlled.  Her CHAD2Vasc was at least 3.    During hospitalization her EF on echo was 25-30% and she was diagnosed with pulmonary hypertension. She was diuresed approximately 30 pounds. She was seen in the hospital by Dr. [place] who felt since she did also have severe mitral regurg and she would not be a candidate for cardioversion as well as due to marketed LA enlargement.  Plans were to control A. fib rate. She was also on new medications to improve EF and would need an outpatient myoview and in 3 months 2-D echo.   Patient admited to drinking vodka every day. Amount varies. Patient also smoked cigarettes. She denies illicit drugs. Since discharge she has stopped drinking and stop smoking she was congratulated on doing both of these things.  We again discussed that nicotine and alcohol would be toxic to her heart and it would be best never to start these 2 addictions again.  Patient has anger issues about needing to take medication but she is taking her medications.   She exercises with aerobics and she plans to go back part-time to work. She states that she had never retired she would not be in this shape today.  She is quite lively with multiple jokes as a way to cope.  Her daughter is with her  today for this visit.  [place] and would prefer to be seen in the regional office and have asked specifically for Dr. Darl HouseholderKoneswaren.  We will make her next appointment with him.   Allergies  Allergen Reactions  . Penicillins Rash    Current Outpatient Prescriptions  Medication Sig Dispense Refill  . albuterol (PROAIR HFA) 108 (90 BASE) MCG/ACT inhaler Inhale 2 puffs into the lungs every 4 (four) hours as needed. 6.7 g 0  . busPIRone (BUSPAR) 5 MG tablet Take 1 tablet (5 mg total) by mouth 3 (three) times daily. 270 tablet 2  . carvedilol (COREG) 12.5 MG tablet Take 1 tablet (12.5 mg total) by mouth 2 (two) times daily with a meal. 180 tablet 2  . digoxin (LANOXIN) 0.125 MG tablet Take 1 tablet (0.125 mg total) by mouth daily. 90 tablet 2  . escitalopram (LEXAPRO) 20 MG tablet Take 1 tablet (20 mg total) by mouth at bedtime. 90 tablet 2  . fluticasone (FLONASE) 50 MCG/ACT nasal spray Place 2 sprays into both nostrils daily. 16 g 1  . folic acid (FOLVITE) 1 MG tablet Take 1 tablet (1 mg total) by mouth daily. 30 tablet 0  . furosemide (LASIX) 40 MG tablet 1 tab by mouth daily 30 tablet 0  . lisinopril (PRINIVIL,ZESTRIL) 10 MG tablet Take 1 tablet (10 mg total) by mouth 2 (  two) times daily. 180 tablet 2  . potassium chloride SA (K-DUR,KLOR-CON) 20 MEQ tablet 1 tab by mouth Daily 90 tablet 2  . Rivaroxaban (XARELTO) 15 MG TABS tablet Take 1 tablet (15 mg total) by mouth daily with supper. 90 tablet 2  . thiamine 100 MG tablet Take 1 tablet (100 mg total) by mouth daily. 90 tablet 2   No current facility-administered medications for this visit.    Past Medical History  Diagnosis Date  . Hypertension   . Anxiety     History reviewed. No pertinent past surgical history.  WGN:FAOZHYQ:MVROS:General:no colds or fevers,  weight decrease, Also very small meals a day.   Skin:+ rash- lt axillary  no ulcers HEENT:no blurred vision, no congestion CV:see HPI PUL:see HPI GI:no diarrhea  constipation or melena, no indigestion GU:no hematuria, no dysuria MS:no joint pain, no claudication, had a fall exercising and bruised Lt shoulder, bruise is healing no pain at site and FROM of Lt arm. Neuro:no syncope, no lightheadedness Endo:no diabetes, no thyroid disease  Wt Readings from Last 3 Encounters:  06/09/14 127 lb (57.607 kg)  05/16/14 131 lb 12.9 oz (59.788 kg)    PHYSICAL EXAM BP 148/82 mmHg  Pulse 91  Ht 5\' 4"  (1.626 m)  Wt 127 lb (57.607 kg)  BMI 21.79 kg/m2 General:Pleasant affect, NAD Skin:Warm and dry, brisk capillary refill, mild pink rash lt axillary area.   HEENT:normocephalic, sclera clear, mucus membranes moist Neck:supple, no JVD, no bruits  Heart:irreg irreg without murmur, gallup, rub or click Lungs:clear without rales, rhonchi, or wheezes HQI:ONGEAbd:soft, non tender, + BS, do not palpate liver spleen or masses Ext:no lower ext edema, 2+ pedal pulses, 2+ radial pulses Neuro:alert and oriented X 3, MAE, follows commands, + facial symmetry  EKG: a flutter with varrying block HR 91  ASSESSMENT AND PLAN A-fib Rate control today at 91 of looks more like atrial flutter today also. No plans for cardioversion will maintain with rate control secondary to severe mitral regurg and left atrial dilatation. Chads2Vasc score of 3.  Cardiomyopathy We'll need outpatient Myoview to determine if ischemic though most likely related to rapid heart rate unknown length of time prior to admission and possibly due to alcohol.  She will see Dr. Darl HouseholderKoneswaren in 2-3 weeks and he will order outpatient Myoview.  She has no chest pain and no acute EKG changes.  Hypokalemia During hospitalization she was hypokalemic we will recheck today or tomorrow depending on lab availability.  Tobacco abuse She has not smoked tobacco since discharge-she was congratulated  Alcohol abuse She has not had alcohol intake since discharge  Benign essential HTN Mild hypertension today will increase  lisinopril to 10 mg twice a day second dose bedtime.  Malnutrition May have been due to alcohol prior to the hospitalization, her appetite is still decreased we discussed muscle nail which she seems to like and in short  Rash Small area left axillary area. She is been applying cream which she can continue if the rash becomes worse she needs see dermatologist. It may be her medication but currently just a small area.  Anticoagulation adequate She is on Xarelto without complications or bleeding.  She will continue to take.

## 2014-06-09 NOTE — Assessment & Plan Note (Signed)
Rate control today at 91 of looks more like atrial flutter today also. No plans for cardioversion will maintain with rate control secondary to severe mitral regurg and left atrial dilatation. Chads2Vasc score of 3.

## 2014-06-09 NOTE — Patient Instructions (Signed)
INCREASE your Lisinopril to TWICE DAILY  Your physician recommends that you return for lab work this week, you can go to the [facility] lab in [place] over by [address]   Your physician recommends that you schedule a follow-up appointment in:3-4 Weeks with DR. KONESWARAN in [place].

## 2014-06-09 NOTE — Assessment & Plan Note (Signed)
We'll need outpatient Myoview to determine if ischemic though most likely related to rapid heart rate unknown length of time prior to admission and possibly due to alcohol.  She will see Dr. Darl HouseholderKoneswaren in 2-3 weeks and he will order outpatient Myoview.  She has no chest pain and no acute EKG changes.

## 2014-06-10 LAB — BASIC METABOLIC PANEL
BUN: 16 mg/dL (ref 6–23)
CALCIUM: 9.3 mg/dL (ref 8.4–10.5)
CO2: 28 meq/L (ref 19–32)
Chloride: 103 mEq/L (ref 96–112)
Creat: 0.9 mg/dL (ref 0.50–1.10)
GLUCOSE: 95 mg/dL (ref 70–99)
Potassium: 4.9 mEq/L (ref 3.5–5.3)
SODIUM: 139 meq/L (ref 135–145)

## 2014-06-10 LAB — MAGNESIUM: Magnesium: 2 mg/dL (ref 1.5–2.5)

## 2014-06-23 ENCOUNTER — Telehealth: Payer: Self-pay | Admitting: Cardiology

## 2014-06-23 NOTE — Telephone Encounter (Signed)
Spoke with patient and informed her to reach out to PCP, original prescriber, PCP She states her lexapro is too costly, but she also mentioned buspar.  She will contact PCP regarding issues with these non-cardiac medication

## 2014-06-23 NOTE — Telephone Encounter (Signed)
Pt says she can not afford the Lexapro or the generic for it. She would like something else in the place of it.

## 2014-06-23 NOTE — Telephone Encounter (Signed)
Pt was on that med before hospitalization, we only refilled what she was supposed to be on.  So if she has not taken then no need to take, otherwise she should see her PCP for further management.

## 2014-06-23 NOTE — Telephone Encounter (Signed)
Message routed to prescriber, Nada BoozerLaura Ingold, NP to advise

## 2014-07-10 ENCOUNTER — Ambulatory Visit (INDEPENDENT_AMBULATORY_CARE_PROVIDER_SITE_OTHER): Payer: BLUE CROSS/BLUE SHIELD | Admitting: Cardiovascular Disease

## 2014-07-10 ENCOUNTER — Encounter: Payer: Self-pay | Admitting: Cardiovascular Disease

## 2014-07-10 VITALS — BP 102/78 | HR 72 | Ht 64.0 in | Wt 127.0 lb

## 2014-07-10 DIAGNOSIS — Z720 Tobacco use: Secondary | ICD-10-CM

## 2014-07-10 DIAGNOSIS — I10 Essential (primary) hypertension: Secondary | ICD-10-CM

## 2014-07-10 DIAGNOSIS — F1010 Alcohol abuse, uncomplicated: Secondary | ICD-10-CM

## 2014-07-10 DIAGNOSIS — I482 Chronic atrial fibrillation, unspecified: Secondary | ICD-10-CM

## 2014-07-10 DIAGNOSIS — I519 Heart disease, unspecified: Secondary | ICD-10-CM

## 2014-07-10 DIAGNOSIS — I5022 Chronic systolic (congestive) heart failure: Secondary | ICD-10-CM

## 2014-07-10 DIAGNOSIS — Z9289 Personal history of other medical treatment: Secondary | ICD-10-CM

## 2014-07-10 DIAGNOSIS — I429 Cardiomyopathy, unspecified: Secondary | ICD-10-CM

## 2014-07-10 DIAGNOSIS — I340 Nonrheumatic mitral (valve) insufficiency: Secondary | ICD-10-CM

## 2014-07-10 DIAGNOSIS — Z87898 Personal history of other specified conditions: Secondary | ICD-10-CM

## 2014-07-10 NOTE — Progress Notes (Signed)
Patient ID: Rebecca Huerta, female   DOB: Nov 04, 1945, 69 y.o.   MRN: [id number]      SUBJECTIVE: The patient is a 69 year old woman who I am meeting for the first time. She has a past medical history significant for anxiety, alcohol abuse, tobacco abuse, chronic systolic heart failure, hypertension, and atrial fibrillation. She was hospitalized for acute on chronic systolic heart failure and rapid atrial fibrillation in 12/15. Echocardiogram on 05/12/14 demonstrated severely reduced left ventricular systolic function, EF 25-30%, moderate to severe left atrial enlargement, severe mitral regurgitation, and moderate tricuspid regurgitation. It was determined that she would need an outpatient nuclear stress test to rule out an ischemic etiology for her cardiomyopathy. Apparently prior to hospitalization, she was drinking vodka every day and smoking but discontinued both of these upon discharge.  She said she no longer drinks or smokes. She said she takes her medications regularly. She denies chest pain, palpitations, dizziness, bleeding problems, shortness of breath, and leg swelling. She said she eats yogurt, chicken, broccoli, and ice cream. She is unmarried. She has one daughter and says her daughter is nothing like her.    Review of Systems: As per "subjective", otherwise negative.  Allergies  Allergen Reactions  . Penicillins Rash    Current Outpatient Prescriptions  Medication Sig Dispense Refill  . albuterol (PROAIR HFA) 108 (90 BASE) MCG/ACT inhaler Inhale 2 puffs into the lungs every 4 (four) hours as needed. 6.7 g 0  . busPIRone (BUSPAR) 5 MG tablet Take 1 tablet (5 mg total) by mouth 3 (three) times daily. 270 tablet 2  . carvedilol (COREG) 12.5 MG tablet Take 1 tablet (12.5 mg total) by mouth 2 (two) times daily with a meal. 180 tablet 2  . digoxin (LANOXIN) 0.125 MG tablet Take 1 tablet (0.125 mg total) by mouth daily. 90 tablet 2  . escitalopram (LEXAPRO) 20 MG tablet Take 1 tablet (20  mg total) by mouth at bedtime. 90 tablet 2  . fluticasone (FLONASE) 50 MCG/ACT nasal spray Place 2 sprays into both nostrils daily. 16 g 1  . folic acid (FOLVITE) 1 MG tablet Take 1 tablet (1 mg total) by mouth daily. 30 tablet 0  . furosemide (LASIX) 40 MG tablet 1 tab by mouth daily 30 tablet 0  . lisinopril (PRINIVIL,ZESTRIL) 10 MG tablet Take 1 tablet (10 mg total) by mouth 2 (two) times daily. 180 tablet 2  . potassium chloride SA (K-DUR,KLOR-CON) 20 MEQ tablet 1 tab by mouth Daily 90 tablet 2  . Rivaroxaban (XARELTO) 15 MG TABS tablet Take 1 tablet (15 mg total) by mouth daily with supper. 90 tablet 2  . thiamine 100 MG tablet Take 1 tablet (100 mg total) by mouth daily. 90 tablet 2   No current facility-administered medications for this visit.    Past Medical History  Diagnosis Date  . Hypertension   . Anxiety     No past surgical history on file.  History   Social History  . Marital Status: Married    Spouse Name: N/A    Number of Children: N/A  . Years of Education: N/A   Occupational History  . Not on file.   Social History Main Topics  . Smoking status: Current Every Day Smoker -- 1.00 packs/day    Types: Cigarettes  . Smokeless tobacco: Not on file  . Alcohol Use: 12.6 oz/week    21 Shots of liquor per week  . Drug Use: No  . Sexual Activity: Not Currently   Other Topics  Concern  . Not on file   Social History Narrative     BP 102/78  Pulse 72  SpO2 99%  Weight 127 lb (57.607 kg) Height 5\' 4"  (1.626 m)   PHYSICAL EXAM General: NAD HEENT: Normal. Neck: No JVD, no thyromegaly. Lungs: Clear to auscultation bilaterally with normal respiratory effort. CV: Irregular rhythm, normal S1/S2, no S3, no murmur. No pretibial or periankle edema.  No carotid bruit.  Normal pedal pulses.  Abdomen: Soft, nontender, no hepatosplenomegaly, no distention.  Neurologic: Alert and oriented x 3.  Psych: Normal affect. Skin: Normal. Musculoskeletal: Normal range of  motion, no gross deformities. Extremities: No clubbing or cyanosis.   ECG: Most recent ECG reviewed.      ASSESSMENT AND PLAN: 1. Atrial fibrillation: HR controlled on Coreg 12.5 mg bid. Continue Xarelto for the time being given CHADSVASC score of 4, thus high thromboembolic risk. 2. Cardiomyopathy with chronic systolic heart failure, EF 25-30%: Thought to be tachycardia-mediated, with other potential etiologies not being excluded. Will obtain a Lexiscan Cardiolite to rule out an ischemic etiology. Currently compensated. Continue Coreg, Lasix, lisinopril, and digoxin. Will need to repeat echo in several months to assess for interval improvement in systolic function. 3. Essential hypertension: Well controlled. No changes.  Time spent: 40 minutes, of which >50% spent reviewing prior hospital and office records with patient and discussing management.  Prentice DockerSuresh Koneswaran, M.D., F.A.C.C.

## 2014-07-10 NOTE — Patient Instructions (Signed)
Your physician wants you to follow-up in: 3 months with Dr.Koneswaran You will receive a reminder letter in the mail two months in advance. If you don't receive a letter, please call our office to schedule the follow-up appointment.     Your physician recommends that you continue on your current medications as directed. Please refer to the Current Medication list given to you today.      Your physician has requested that you have a lexiscan myoview. For further information please visit [email or web address]. Please follow instruction sheet, as given.     Thank you for choosing [facility] !

## 2014-07-11 ENCOUNTER — Other Ambulatory Visit: Payer: Self-pay | Admitting: Cardiology

## 2014-07-17 ENCOUNTER — Other Ambulatory Visit (HOSPITAL_COMMUNITY): Payer: Medicare HMO

## 2014-07-17 ENCOUNTER — Ambulatory Visit (HOSPITAL_COMMUNITY): Payer: Medicare HMO

## 2014-07-17 ENCOUNTER — Encounter (HOSPITAL_COMMUNITY): Payer: Medicare HMO

## 2014-07-20 ENCOUNTER — Encounter (HOSPITAL_COMMUNITY): Payer: BLUE CROSS/BLUE SHIELD

## 2014-07-20 ENCOUNTER — Inpatient Hospital Stay (HOSPITAL_COMMUNITY): Admission: RE | Admit: 2014-07-20 | Payer: Medicare HMO | Source: Ambulatory Visit

## 2014-10-08 ENCOUNTER — Ambulatory Visit: Payer: Medicare HMO | Admitting: Cardiovascular Disease

## 2014-10-08 ENCOUNTER — Ambulatory Visit (INDEPENDENT_AMBULATORY_CARE_PROVIDER_SITE_OTHER): Payer: Medicare HMO | Admitting: Cardiovascular Disease

## 2014-10-08 ENCOUNTER — Encounter: Payer: Self-pay | Admitting: Cardiovascular Disease

## 2014-10-08 VITALS — BP 102/60 | HR 60 | Ht 64.0 in | Wt 133.0 lb

## 2014-10-08 DIAGNOSIS — I482 Chronic atrial fibrillation, unspecified: Secondary | ICD-10-CM

## 2014-10-08 DIAGNOSIS — I5022 Chronic systolic (congestive) heart failure: Secondary | ICD-10-CM

## 2014-10-08 DIAGNOSIS — I519 Heart disease, unspecified: Secondary | ICD-10-CM

## 2014-10-08 DIAGNOSIS — I429 Cardiomyopathy, unspecified: Secondary | ICD-10-CM

## 2014-10-08 DIAGNOSIS — I10 Essential (primary) hypertension: Secondary | ICD-10-CM | POA: Diagnosis not present

## 2014-10-08 NOTE — Progress Notes (Signed)
Patient ID: Rebecca Huerta, female   DOB: August 09, 1945, 69 y.o.   MRN: [id number]      SUBJECTIVE: The patient presents for routine cardiovascular follow-up. In summary, she has a past medical history significant for anxiety, alcohol abuse, tobacco abuse, chronic systolic heart failure, hypertension, and atrial fibrillation. She was hospitalized for acute on chronic systolic heart failure and rapid atrial fibrillation in 05/2014. Echocardiogram on 05/12/14 demonstrated severely reduced left ventricular systolic function, EF 25-30%, moderate to severe left atrial enlargement, severe mitral regurgitation, and moderate tricuspid regurgitation. Apparently prior to hospitalization, she was drinking vodka every day and smoking but discontinued both of these upon discharge.  She has no complaints, denying chest pain, palpitations, shortness of breath, dizziness, and leg swelling. She would like to start using her wood splitter and chain saw, and complains that she is no longer allowed to smoke or drink.  She did not obtain the nuclear stress test and does not want to.    Review of Systems: As per "subjective", otherwise negative.  Allergies  Allergen Reactions  . Penicillins Rash    Current Outpatient Prescriptions  Medication Sig Dispense Refill  . albuterol (PROAIR HFA) 108 (90 BASE) MCG/ACT inhaler Inhale 2 puffs into the lungs every 4 (four) hours as needed. 6.7 g 0  . busPIRone (BUSPAR) 5 MG tablet Take 1 tablet (5 mg total) by mouth 3 (three) times daily. 270 tablet 2  . carvedilol (COREG) 12.5 MG tablet Take 1 tablet (12.5 mg total) by mouth 2 (two) times daily with a meal. 180 tablet 2  . digoxin (LANOXIN) 0.125 MG tablet Take 1 tablet (0.125 mg total) by mouth daily. 90 tablet 2  . escitalopram (LEXAPRO) 20 MG tablet Take 1 tablet (20 mg total) by mouth at bedtime. 90 tablet 2  . fluticasone (FLONASE) 50 MCG/ACT nasal spray Place 2 sprays into both nostrils daily. 16 g 1  . folic acid  (FOLVITE) 1 MG tablet take 1 tablet by mouth once daily 30 tablet 6  . furosemide (LASIX) 40 MG tablet take 1 tablet by mouth once daily 30 tablet 6  . lisinopril (PRINIVIL,ZESTRIL) 10 MG tablet Take 1 tablet (10 mg total) by mouth 2 (two) times daily. 180 tablet 2  . potassium chloride SA (K-DUR,KLOR-CON) 20 MEQ tablet 1 tab by mouth Daily 90 tablet 2  . Rivaroxaban (XARELTO) 15 MG TABS tablet Take 1 tablet (15 mg total) by mouth daily with supper. 90 tablet 2  . thiamine 100 MG tablet Take 1 tablet (100 mg total) by mouth daily. 90 tablet 2   No current facility-administered medications for this visit.    Past Medical History  Diagnosis Date  . Hypertension   . Anxiety     No past surgical history on file.  History   Social History  . Marital Status: Married    Spouse Name: N/A  . Number of Children: N/A  . Years of Education: N/A   Occupational History  . Not on file.   Social History Main Topics  . Smoking status: Former Smoker -- 1.00 packs/day    Types: Cigarettes    Start date: 05/17/1961    Quit date: 05/10/2014  . Smokeless tobacco: Never Used  . Alcohol Use: 12.6 oz/week    21 Shots of liquor per week  . Drug Use: No  . Sexual Activity: Not Currently   Other Topics Concern  . Not on file   Social History Narrative     Filed Vitals:  10/08/14 1145  BP: 102/60  Pulse: 60  Height: 5\' 4"  (1.626 m)  Weight: 133 lb (60.328 kg)  SpO2: 95%    PHYSICAL EXAM General: NAD HEENT: Normal. Neck: No JVD, no thyromegaly. Lungs: Clear to auscultation bilaterally with normal respiratory effort. CV: Irregular rhythm, normal S1/S2, no S3, no murmur. No pretibial or periankle edema. No carotid bruit. Normal pedal pulses.  Abdomen: Soft, nontender, no hepatosplenomegaly, no distention.  Neurologic: Alert and oriented x 3.  Psych: Somewhat flat affect. Skin: Normal. Musculoskeletal: Normal range of motion, no gross deformities. Extremities: No clubbing or  cyanosis.   ECG: Most recent ECG reviewed.      ASSESSMENT AND PLAN: 1. Atrial fibrillation: HR controlled on Coreg 12.5 mg bid. Continue Xarelto given CHADSVASC score of 4, thus high thromboembolic risk.  2. Cardiomyopathy with chronic systolic heart failure, EF 25-30%: Thought to be tachycardia-mediated, with other potential etiologies not being excluded. She does not want to obtain a Lexiscan Cardiolite to rule out an ischemic etiology. Currently compensated. Continue Coreg, Lasix, lisinopril, and digoxin. Will need to repeat echo in several months to assess for interval improvement in systolic function.  3. Essential hypertension: Well controlled. No changes.  Dispo: f/u 6 months.   Prentice DockerSuresh Khushi Zupko, M.D., F.A.C.C.

## 2014-10-08 NOTE — Patient Instructions (Signed)
Your physician wants you to follow-up in: 6 months with Koneswaran. You will receive a reminder letter in the mail two months in advance. If you don't receive a letter, please call our office to schedule the follow-up appointment.  Your physician recommends that you continue on your current medications as directed. Please refer to the Current Medication list given to you today.  Thank you for choosing [facility]!

## 2014-12-11 ENCOUNTER — Other Ambulatory Visit: Payer: Self-pay | Admitting: Cardiology

## 2015-01-18 ENCOUNTER — Other Ambulatory Visit: Payer: Self-pay

## 2015-01-18 MED ORDER — FOLIC ACID 1 MG PO TABS: 1.0000 mg | ORAL_TABLET | Freq: Every day | ORAL | Status: DC

## 2015-01-18 MED ORDER — FUROSEMIDE 40 MG PO TABS: 40.0000 mg | ORAL_TABLET | Freq: Every day | ORAL | Status: DC

## 2015-03-11 ENCOUNTER — Other Ambulatory Visit: Payer: Self-pay | Admitting: Cardiology

## 2015-04-09 ENCOUNTER — Encounter: Payer: Self-pay | Admitting: Cardiovascular Disease

## 2015-04-09 ENCOUNTER — Ambulatory Visit (INDEPENDENT_AMBULATORY_CARE_PROVIDER_SITE_OTHER): Payer: Medicare HMO | Admitting: Cardiovascular Disease

## 2015-04-09 VITALS — BP 130/86 | HR 50 | Ht 64.0 in | Wt 139.0 lb

## 2015-04-09 DIAGNOSIS — I482 Chronic atrial fibrillation, unspecified: Secondary | ICD-10-CM

## 2015-04-09 DIAGNOSIS — I10 Essential (primary) hypertension: Secondary | ICD-10-CM | POA: Diagnosis not present

## 2015-04-09 DIAGNOSIS — I429 Cardiomyopathy, unspecified: Secondary | ICD-10-CM

## 2015-04-09 DIAGNOSIS — I519 Heart disease, unspecified: Secondary | ICD-10-CM | POA: Diagnosis not present

## 2015-04-09 DIAGNOSIS — I5189 Other ill-defined heart diseases: Secondary | ICD-10-CM

## 2015-04-09 DIAGNOSIS — Z7901 Long term (current) use of anticoagulants: Secondary | ICD-10-CM

## 2015-04-09 DIAGNOSIS — I5022 Chronic systolic (congestive) heart failure: Secondary | ICD-10-CM | POA: Diagnosis not present

## 2015-04-09 NOTE — Progress Notes (Signed)
Patient ID: Rebecca Huerta, female   DOB: 1945-06-30, 69 y.o.   MRN: [id number]      SUBJECTIVE: The patient presents for routine cardiovascular follow-up. In summary, she has a past medical history significant for anxiety, alcohol abuse, tobacco abuse, chronic systolic heart failure, hypertension, and atrial fibrillation. She was hospitalized for acute on chronic systolic heart failure and rapid atrial fibrillation in 05/2014. Echocardiogram on 05/12/14 demonstrated severely reduced left ventricular systolic function, EF 25-30%, moderate to severe left atrial enlargement, severe mitral regurgitation, and moderate tricuspid regurgitation. Apparently prior to hospitalization, she was drinking vodka every day and smoking but discontinued both of these upon discharge.  She has no complaints, denying chest pain, palpitations, shortness of breath, dizziness, and leg swelling. She stays active walking.  She previously refused to obtain a nuclear stress test.   Review of Systems: As per "subjective", otherwise negative.  Allergies  Allergen Reactions  . Penicillins Rash    Current Outpatient Prescriptions  Medication Sig Dispense Refill  . albuterol (PROAIR HFA) 108 (90 BASE) MCG/ACT inhaler Inhale 2 puffs into the lungs every 4 (four) hours as needed. 6.7 g 0  . busPIRone (BUSPAR) 5 MG tablet take 1 tablet by mouth three times a day 270 tablet 1  . carvedilol (COREG) 12.5 MG tablet take 1 tablet by mouth twice a day with meals 180 tablet 2  . digoxin (LANOXIN) 0.125 MG tablet take 1 tablet by mouth once daily 90 tablet 2  . escitalopram (LEXAPRO) 20 MG tablet Take 1 tablet (20 mg total) by mouth at bedtime. 90 tablet 2  . fluticasone (FLONASE) 50 MCG/ACT nasal spray Place 2 sprays into both nostrils daily. 16 g 1  . folic acid (FOLVITE) 1 MG tablet Take 1 tablet (1 mg total) by mouth daily. 30 tablet 6  . furosemide (LASIX) 40 MG tablet Take 1 tablet (40 mg total) by mouth daily. 30 tablet 6  .  lisinopril (PRINIVIL,ZESTRIL) 10 MG tablet take 1 tablet by mouth twice a day 180 tablet 2  . potassium chloride SA (K-DUR,KLOR-CON) 20 MEQ tablet take 1 tablet by mouth once daily 90 tablet 2  . Rivaroxaban (XARELTO) 15 MG TABS tablet Take 1 tablet (15 mg total) by mouth daily with supper. 90 tablet 2  . thiamine 100 MG tablet Take 1 tablet (100 mg total) by mouth daily. 90 tablet 2   No current facility-administered medications for this visit.    Past Medical History  Diagnosis Date  . Hypertension   . Anxiety     No past surgical history on file.  Social History   Social History  . Marital Status: Married    Spouse Name: N/A  . Number of Children: N/A  . Years of Education: N/A   Occupational History  . Not on file.   Social History Main Topics  . Smoking status: Former Smoker -- 1.00 packs/day    Types: Cigarettes    Start date: 05/17/1961    Quit date: 05/10/2014  . Smokeless tobacco: Never Used  . Alcohol Use: 12.6 oz/week    21 Shots of liquor per week  . Drug Use: No  . Sexual Activity: Not Currently   Other Topics Concern  . Not on file   Social History Narrative     Filed Vitals:   04/09/15 0809  BP: 130/86  Pulse: 50  Height:  (1.626 m)  Weight: 139 lb (63.05 kg)  SpO2: 95%   HR: 70 bpm range by auscultation  PHYSICAL EXAM General: NAD HEENT: Normal. Neck: No JVD, no thyromegaly. Lungs: Clear to auscultation bilaterally with normal respiratory effort. CV: Regular rate and irregular rhythm, normal S1/S2, no S3, no murmur. No pretibial or periankle edema.   Abdomen: Soft, nontender, no distention.  Neurologic: Alert and oriented x 3.  Psych: Normal affect. Skin: Normal. Musculoskeletal: No gross deformities. Extremities: No clubbing or cyanosis.   ECG: Most recent ECG reviewed.      ASSESSMENT AND PLAN: 1. Atrial fibrillation: HR controlled on Coreg 12.5 mg bid. Continue Xarelto given CHADSVASC score of 4, thus high  thromboembolic risk.  2. Cardiomyopathy with chronic systolic heart failure, EF 25-30%: Thought to be tachycardia-mediated, with other potential etiologies not being excluded. She did not want to obtain a Lexiscan Cardiolite to rule out an ischemic etiology. Currently compensated. Continue Coreg, Lasix, lisinopril, and digoxin. Will repeat echo to assess for interval improvement in systolic function.  3. Essential hypertension: Well controlled. No changes.  Dispo: f/u 6 months.   Prentice DockerSuresh Ailish Prospero, M.D., F.A.C.C.

## 2015-04-09 NOTE — Patient Instructions (Signed)
Your physician wants you to follow-up in: 6 months with Dr Koneswaran You will receive a reminder letter in the mail two months in advance. If you don't receive a letter, please call our office to schedule the follow-up appointment.    Your physician recommends that you continue on your current medications as directed. Please refer to the Current Medication list given to you today.     If you need a refill on your cardiac medications before your next appointment, please call your pharmacy.    Your physician has requested that you have an echocardiogram. Echocardiography is a painless test that uses sound waves to create images of your heart. It provides your doctor with information about the size and shape of your heart and how well your heart's chambers and valves are working. This procedure takes approximately one hour. There are no restrictions for this procedure.       Thank you for choosing [facility] !        

## 2015-04-16 ENCOUNTER — Other Ambulatory Visit (HOSPITAL_COMMUNITY): Payer: BLUE CROSS/BLUE SHIELD

## 2015-04-20 ENCOUNTER — Ambulatory Visit (HOSPITAL_COMMUNITY)
Admission: RE | Admit: 2015-04-20 | Discharge: 2015-04-20 | Disposition: A | Discharge status: Home / Self Care | Payer: Medicare HMO | Source: Ambulatory Visit | Attending: Cardiovascular Disease | Admitting: Cardiovascular Disease

## 2015-04-20 DIAGNOSIS — I429 Cardiomyopathy, unspecified: Secondary | ICD-10-CM | POA: Diagnosis present

## 2015-04-20 DIAGNOSIS — I517 Cardiomegaly: Secondary | ICD-10-CM | POA: Diagnosis not present

## 2015-04-20 DIAGNOSIS — I340 Nonrheumatic mitral (valve) insufficiency: Secondary | ICD-10-CM | POA: Insufficient documentation

## 2015-04-20 DIAGNOSIS — I5022 Chronic systolic (congestive) heart failure: Secondary | ICD-10-CM | POA: Diagnosis not present

## 2015-04-20 DIAGNOSIS — I10 Essential (primary) hypertension: Secondary | ICD-10-CM | POA: Insufficient documentation

## 2015-04-20 DIAGNOSIS — I358 Other nonrheumatic aortic valve disorders: Secondary | ICD-10-CM | POA: Insufficient documentation

## 2015-04-20 NOTE — Progress Notes (Signed)
*  PRELIMINARY RESULTS* Echocardiogram 2D Echocardiogram has been performed.  Jeryl Columbialliott, Ronya Gilcrest 04/20/2015, 12:07 PM

## 2015-06-09 ENCOUNTER — Other Ambulatory Visit: Payer: Self-pay | Admitting: Cardiovascular Disease

## 2015-07-09 ENCOUNTER — Other Ambulatory Visit: Payer: Self-pay | Admitting: Cardiovascular Disease

## 2015-07-12 ENCOUNTER — Telehealth: Payer: Self-pay | Admitting: Cardiovascular Disease

## 2015-07-12 NOTE — Telephone Encounter (Signed)
Patient wants to know if she can drink "muscle milk" with the meds she is taking. / tg

## 2015-07-12 NOTE — Telephone Encounter (Signed)
Pt mixes protein powder with water and takes daily

## 2015-08-08 ENCOUNTER — Other Ambulatory Visit: Payer: Self-pay | Admitting: Cardiovascular Disease

## 2015-08-17 DIAGNOSIS — F419 Anxiety disorder, unspecified: Secondary | ICD-10-CM | POA: Diagnosis not present

## 2015-08-17 DIAGNOSIS — I10 Essential (primary) hypertension: Secondary | ICD-10-CM | POA: Diagnosis not present

## 2015-09-07 ENCOUNTER — Other Ambulatory Visit: Payer: Self-pay | Admitting: Cardiovascular Disease

## 2015-10-07 ENCOUNTER — Encounter: Payer: Self-pay | Admitting: Cardiovascular Disease

## 2015-10-07 ENCOUNTER — Ambulatory Visit (INDEPENDENT_AMBULATORY_CARE_PROVIDER_SITE_OTHER): Payer: PPO | Admitting: Cardiovascular Disease

## 2015-10-07 VITALS — BP 142/94 | HR 66 | Ht 64.0 in | Wt 145.0 lb

## 2015-10-07 DIAGNOSIS — I4891 Unspecified atrial fibrillation: Secondary | ICD-10-CM

## 2015-10-07 DIAGNOSIS — I10 Essential (primary) hypertension: Secondary | ICD-10-CM | POA: Diagnosis not present

## 2015-10-07 DIAGNOSIS — I429 Cardiomyopathy, unspecified: Secondary | ICD-10-CM | POA: Diagnosis not present

## 2015-10-07 DIAGNOSIS — Z7901 Long term (current) use of anticoagulants: Secondary | ICD-10-CM

## 2015-10-07 NOTE — Progress Notes (Signed)
Patient ID: Rebecca Huerta, female   DOB: 05-07-1946, 70 y.o.   MRN: [id number]      SUBJECTIVE: The patient presents for routine cardiovascular follow-up. In summary, she has a past medical history significant for anxiety, alcohol abuse, tobacco abuse, chronic systolic heart failure, hypertension, and atrial fibrillation. She was hospitalized for acute on chronic systolic heart failure and rapid atrial fibrillation in 05/2014. Echocardiogram on 05/12/14 demonstrated severely reduced left ventricular systolic function, EF 25-30%, moderate to severe left atrial enlargement, severe mitral regurgitation, and moderate tricuspid regurgitation. Apparently prior to hospitalization, she was drinking vodka every day and smoking but discontinued both of these upon discharge.  She has no complaints, denying chest pain, palpitations, shortness of breath, dizziness, and leg swelling.  Follow-up echocardiogram on 04/20/15 demonstrated normalization of left ventricular systolic function, EF 55-60%, mild mitral regurgitation, severe left atrial enlargement, and mild right atrial enlargement.  ECG performed in the office today which I personally interpreted demonstrated atrial fibrillation with nonspecific T-wave abnormalities, heart rate 70 bpm.  I reviewed her blood pressures taken at home and they are all normal.  Brought a jar of homemade honey.   Review of Systems: As per "subjective", otherwise negative.  Allergies  Allergen Reactions  . Penicillins Rash    Current Outpatient Prescriptions  Medication Sig Dispense Refill  . albuterol (PROAIR HFA) 108 (90 BASE) MCG/ACT inhaler Inhale 2 puffs into the lungs every 4 (four) hours as needed. 6.7 g 0  . busPIRone (BUSPAR) 5 MG tablet take 1 tablet by mouth three times a day 270 tablet 1  . carvedilol (COREG) 12.5 MG tablet take 1 tablet by mouth twice a day with meals 180 tablet 2  . DIGITEK 125 MCG tablet take 1 tablet by mouth once daily 90 tablet 2  .  escitalopram (LEXAPRO) 20 MG tablet Take 1 tablet (20 mg total) by mouth at bedtime. 90 tablet 2  . fluticasone (FLONASE) 50 MCG/ACT nasal spray Place 2 sprays into both nostrils daily. 16 g 1  . folic acid (FOLVITE) 1 MG tablet take 1 tablet by mouth once daily 30 tablet 6  . furosemide (LASIX) 40 MG tablet take 1 tablet by mouth once daily 30 tablet 6  . lisinopril (PRINIVIL,ZESTRIL) 10 MG tablet take 1 tablet by mouth twice a day 180 tablet 2  . potassium chloride SA (K-DUR,KLOR-CON) 20 MEQ tablet take 1 tablet by mouth once daily 90 tablet 2  . Rivaroxaban (XARELTO) 15 MG TABS tablet Take 1 tablet (15 mg total) by mouth daily with supper. 90 tablet 2  . thiamine 100 MG tablet Take 1 tablet (100 mg total) by mouth daily. 90 tablet 2   No current facility-administered medications for this visit.    Past Medical History  Diagnosis Date  . Hypertension   . Anxiety     No past surgical history on file.  Social History   Social History  . Marital Status: Married    Spouse Name: N/A  . Number of Children: N/A  . Years of Education: N/A   Occupational History  . Not on file.   Social History Main Topics  . Smoking status: Former Smoker -- 1.00 packs/day    Types: Cigarettes    Start date: 05/17/1961    Quit date: 05/10/2014  . Smokeless tobacco: Never Used  . Alcohol Use: 12.6 oz/week    21 Shots of liquor per week  . Drug Use: No  . Sexual Activity: Not Currently   Other Topics Concern  .  Not on file   Social History Narrative     Filed Vitals:   10/07/15 1317  BP: 142/94  Pulse: 66  Height: 5\' 4"  (1.626 m)  Weight: 145 lb (65.772 kg)  SpO2: 97%    PHYSICAL EXAM General: NAD HEENT: Normal. Neck: No JVD, no thyromegaly. Lungs: Clear to auscultation bilaterally with normal respiratory effort. CV: Regular rate and irregular rhythm, normal S1/S2, no S3, no murmur. No pretibial or periankle edema.  Abdomen: Soft, nontender, no distention.  Neurologic: Alert  and oriented x 3.  Psych: Normal affect. Skin: Normal. Musculoskeletal: No gross deformities. Extremities: No clubbing or cyanosis.   ECG: Most recent ECG reviewed.      ASSESSMENT AND PLAN: 1. Atrial fibrillation: HR controlled on Coreg 12.5 mg bid. Continue Xarelto given CHADSVASC score of 4, thus high thromboembolic risk.  2. Cardiomyopathy with chronic systolic heart failure, EF now normalized 55-60%: Thought to be tachycardia-mediated, with other potential etiologies not being excluded. She did not want to obtain a Lexiscan Cardiolite to rule out an ischemic etiology. Continue Coreg, lisinopril, and digoxin. Will d/c Lasix.  3. Essential hypertension: Well controlled at home. No changes.  Dispo: f/u 1 year.     Prentice DockerSuresh Koneswaran, M.D., F.A.C.C.

## 2015-10-07 NOTE — Patient Instructions (Addendum)
Your physician wants you to follow-up in: 1 year You will receive a reminder letter in the mail two months in advance. If you don't receive a letter, please call our office to schedule the follow-up appointment.    STOP Lasix and STOP Potassium- do not throw medication away though     Thank you for choosing [facility] !

## 2015-10-26 IMAGING — CR DG CHEST 1V PORT
1 series · 1 of 1 positions shown · non-contrast
Comparison: 05/12/2014

CLINICAL DATA: Left pleural effusion

EXAM:
PORTABLE CHEST - 1 VIEW

[AP]
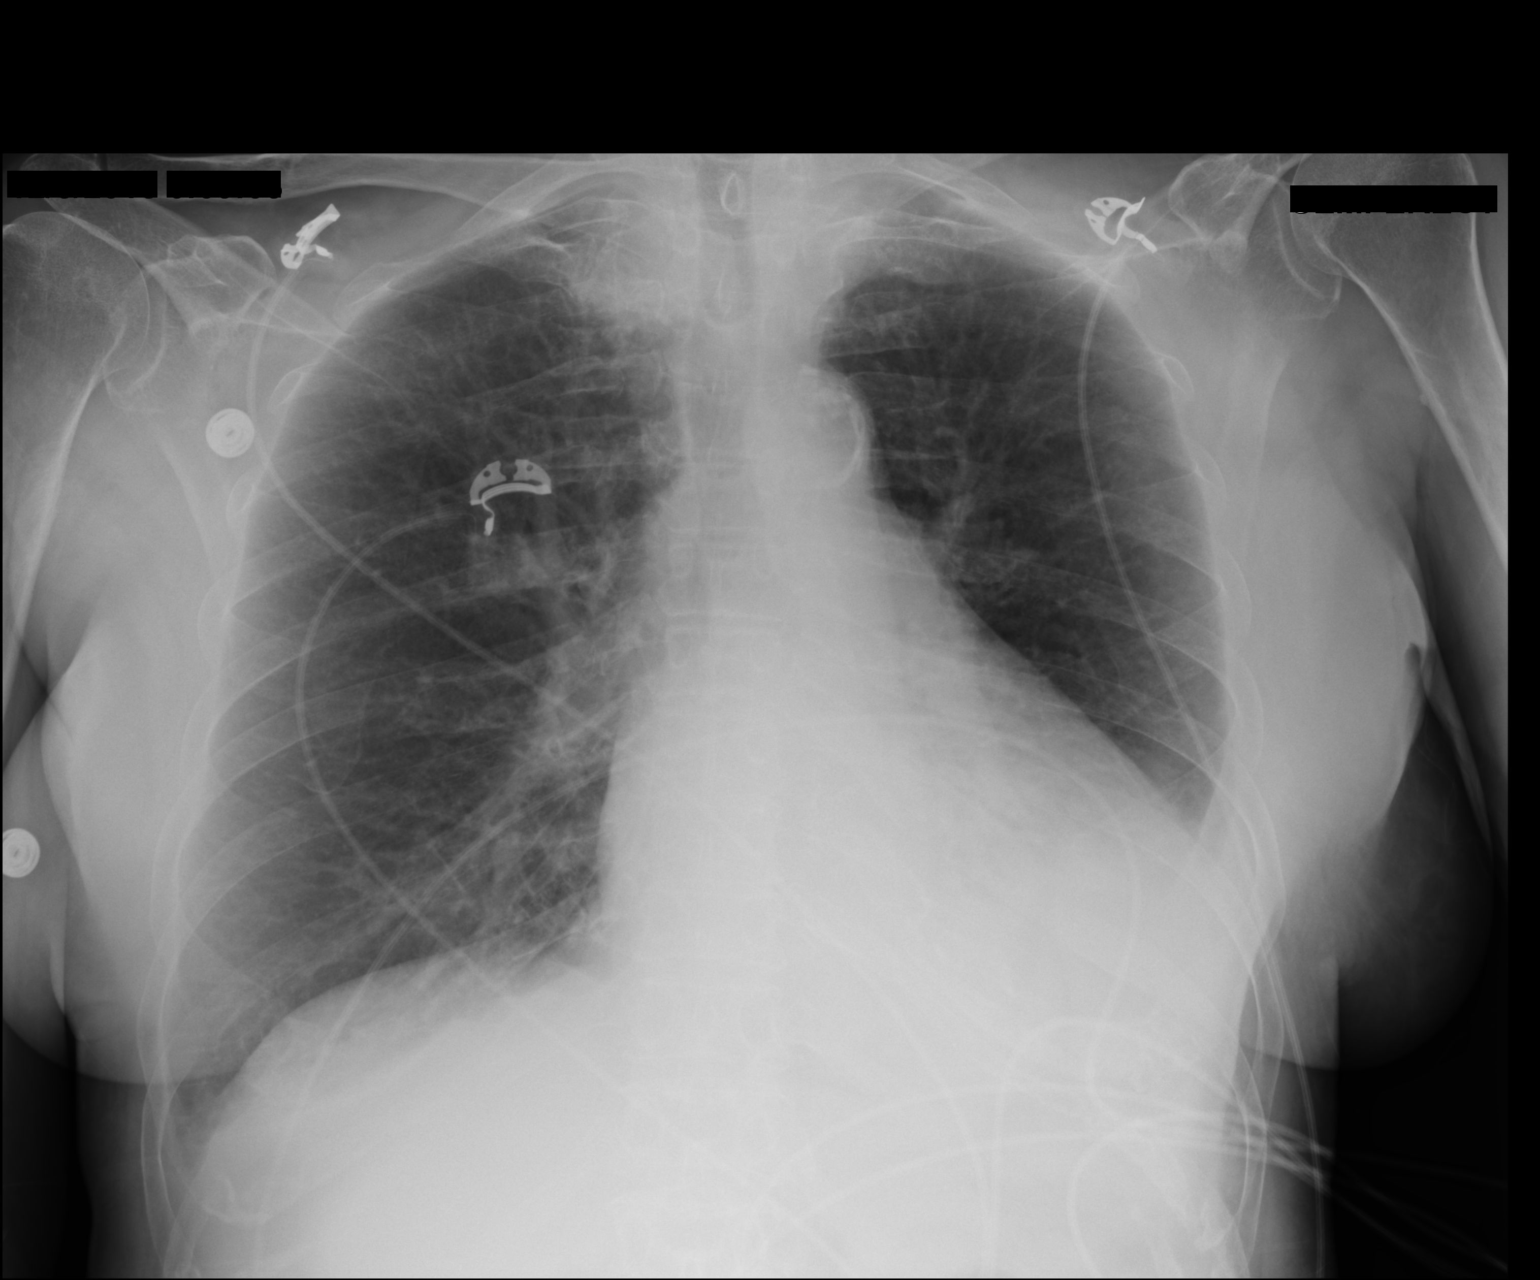

[1 of 1 positions shown; findings below may reference images not displayed]

FINDINGS: Slight decrease in the left effusion with persistent left lower lobe
collapse/consolidation. Pneumonia not excluded. Interval improvement
in the right effusion. Residual right base atelectasis noted.
Background COPD/ emphysema suspected. Atherosclerosis of the aorta.
No pneumothorax. Trachea is midline.
IMPRESSION: Smaller left effusion with residual left lower lobe
collapse/consolidation.

Nearly resolved right effusion

COPD/ emphysema

Cardiomegaly without CHF

## 2016-01-03 ENCOUNTER — Other Ambulatory Visit: Payer: Self-pay | Admitting: Cardiovascular Disease

## 2016-01-25 ENCOUNTER — Emergency Department (HOSPITAL_COMMUNITY): Payer: PPO

## 2016-01-25 ENCOUNTER — Encounter (HOSPITAL_COMMUNITY): Payer: Self-pay | Admitting: *Deleted

## 2016-01-25 ENCOUNTER — Emergency Department (HOSPITAL_COMMUNITY)
Admission: EM | Admit: 2016-01-25 | Discharge: 2016-01-25 | Disposition: A | Discharge status: Home / Self Care | Payer: PPO | Attending: Emergency Medicine | Admitting: Emergency Medicine

## 2016-01-25 DIAGNOSIS — Z87891 Personal history of nicotine dependence: Secondary | ICD-10-CM | POA: Insufficient documentation

## 2016-01-25 DIAGNOSIS — Y939 Activity, unspecified: Secondary | ICD-10-CM | POA: Diagnosis not present

## 2016-01-25 DIAGNOSIS — S4991XA Unspecified injury of right shoulder and upper arm, initial encounter: Secondary | ICD-10-CM | POA: Diagnosis not present

## 2016-01-25 DIAGNOSIS — I110 Hypertensive heart disease with heart failure: Secondary | ICD-10-CM | POA: Diagnosis not present

## 2016-01-25 DIAGNOSIS — W1789XA Other fall from one level to another, initial encounter: Secondary | ICD-10-CM | POA: Diagnosis not present

## 2016-01-25 DIAGNOSIS — Y929 Unspecified place or not applicable: Secondary | ICD-10-CM | POA: Diagnosis not present

## 2016-01-25 DIAGNOSIS — S300XXA Contusion of lower back and pelvis, initial encounter: Secondary | ICD-10-CM | POA: Insufficient documentation

## 2016-01-25 DIAGNOSIS — W19XXXA Unspecified fall, initial encounter: Secondary | ICD-10-CM

## 2016-01-25 DIAGNOSIS — Y999 Unspecified external cause status: Secondary | ICD-10-CM | POA: Insufficient documentation

## 2016-01-25 DIAGNOSIS — I5023 Acute on chronic systolic (congestive) heart failure: Secondary | ICD-10-CM | POA: Diagnosis not present

## 2016-01-25 DIAGNOSIS — S79911A Unspecified injury of right hip, initial encounter: Secondary | ICD-10-CM | POA: Diagnosis not present

## 2016-01-25 DIAGNOSIS — Z79899 Other long term (current) drug therapy: Secondary | ICD-10-CM | POA: Diagnosis not present

## 2016-01-25 DIAGNOSIS — S42211A Unspecified displaced fracture of surgical neck of right humerus, initial encounter for closed fracture: Secondary | ICD-10-CM | POA: Insufficient documentation

## 2016-01-25 DIAGNOSIS — S42201A Unspecified fracture of upper end of right humerus, initial encounter for closed fracture: Secondary | ICD-10-CM

## 2016-01-25 DIAGNOSIS — S42291A Other displaced fracture of upper end of right humerus, initial encounter for closed fracture: Secondary | ICD-10-CM | POA: Diagnosis not present

## 2016-01-25 HISTORY — DX: Heart failure, unspecified: I50.9

## 2016-01-25 HISTORY — DX: Unspecified atrial fibrillation: I48.91

## 2016-01-25 MED ORDER — OXYCODONE-ACETAMINOPHEN 5-325 MG PO TABS: 1.0000 | ORAL_TABLET | ORAL | 0 refills | Status: DC | PRN

## 2016-01-25 MED ORDER — OXYCODONE HCL 5 MG PO TABS
5.0000 mg | ORAL_TABLET | Freq: Once | ORAL | Status: AC
  Administered 2016-01-25: 5 mg via ORAL
  Filled 2016-01-25: qty 1

## 2016-01-25 MED ORDER — IBUPROFEN 400 MG PO TABS
600.0000 mg | ORAL_TABLET | Freq: Once | ORAL | Status: AC
  Administered 2016-01-25: 600 mg via ORAL
  Filled 2016-01-25: qty 2

## 2016-01-25 NOTE — ED Provider Notes (Signed)
[facility]-EMERGENCY DEPT Provider Note   CSN: [id number] Arrival date & time: 01/25/16  1651     History   Chief Complaint Chief Complaint  Patient presents with  . Fall    HPI Rebecca Huerta is a 70 y.o. female.  Pt fell off her stool and landed on her right side.  The pt is on blood thinners, but did not hit her head or have a loc.  The pt c/o mainly right shoulder pain, but a little right hip pain.        Past Medical History:  Diagnosis Date  . A-fib (HCC)   . Anxiety   . CHF (congestive heart failure) (HCC)   . Hypertension     Patient Active Problem List   Diagnosis Date Noted  . Cardiomyopathy (HCC) 06/09/2014  . Chronic systolic heart failure (HCC) 06/09/2014  . Malnutrition (HCC) 06/09/2014  . Rash 06/09/2014  . Anticoagulation adequate 06/09/2014  . Pulmonary hypertension (HCC)   . Acute on chronic systolic CHF (congestive heart failure) (HCC) 05/12/2014  . Benign essential HTN 05/12/2014  . Anxiety state 05/12/2014  . Atrial fibrillation with RVR (HCC) 05/12/2014  . Postoperative pulmonary edema (HCC) 05/12/2014  . Pleural effusion 05/12/2014  . Hypokalemia 05/12/2014  . Hyponatremia 05/12/2014  . A-fib (HCC) 05/11/2014  . Edema leg 05/11/2014  . Pulmonary edema 05/11/2014  . Tobacco abuse 05/11/2014  . Alcohol abuse 05/11/2014    Past Surgical History:  Procedure Laterality Date  . TUBAL LIGATION      OB History    No data available       Home Medications    Prior to Admission medications   Medication Sig Start Date End Date Taking? Authorizing Provider  albuterol (PROAIR HFA) 108 (90 BASE) MCG/ACT inhaler Inhale 2 puffs into the lungs every 4 (four) hours as needed. 06/09/14   Leone Brand, NP  busPIRone (BUSPAR) 5 MG tablet take 1 tablet by mouth three times a day 09/07/15   Laqueta Linden, MD  carvedilol (COREG) 12.5 MG tablet take 1 tablet by mouth twice a day with meals 07/09/15   Laqueta Linden, MD  DIGITEK 125 MCG tablet  take 1 tablet by mouth once daily 07/09/15   Laqueta Linden, MD  escitalopram (LEXAPRO) 20 MG tablet Take 1 tablet (20 mg total) by mouth at bedtime. 06/09/14   Leone Brand, NP  fluticasone (FLONASE) 50 MCG/ACT nasal spray Place 2 sprays into both nostrils daily. 06/09/14   Leone Brand, NP  folic acid (FOLVITE) 1 MG tablet take 1 tablet by mouth once daily 08/09/15   Laqueta Linden, MD  lisinopril (PRINIVIL,ZESTRIL) 10 MG tablet take 1 tablet twice a day 01/03/16   Laqueta Linden, MD  oxyCODONE-acetaminophen (PERCOCET/ROXICET) 5-325 MG tablet Take 1 tablet by mouth every 4 (four) hours as needed for severe pain. 01/25/16   Jacalyn Lefevre, MD  Rivaroxaban (XARELTO) 15 MG TABS tablet Take 1 tablet (15 mg total) by mouth daily with supper. 06/09/14   Leone Brand, NP  thiamine 100 MG tablet Take 1 tablet (100 mg total) by mouth daily. 06/09/14   Leone Brand, NP    Family History No family history on file.  Social History Social History  Substance Use Topics  . Smoking status: Former Smoker    Packs/day: 1.00    Types: Cigarettes    Start date: 05/17/1961    Quit date: 05/10/2014  . Smokeless tobacco: Never Used  . Alcohol  use No     Allergies   Penicillins   Review of Systems Review of Systems  Musculoskeletal:       Right shoulder, right hip pain  All other systems reviewed and are negative.    Physical Exam Updated Vital Signs BP (!) 167/107 (BP Location: Left Arm)   Pulse 66   Temp 97.9 F (36.6 C) (Oral)   Resp 18   Ht 5\' 4"  (1.626 m)   Wt 135 lb (61.2 kg)   SpO2 97%   BMI 23.17 kg/m   Physical Exam  Constitutional: She is oriented to person, place, and time. She appears well-developed and well-nourished.  HENT:  Head: Normocephalic and atraumatic.  Right Ear: External ear normal.  Left Ear: External ear normal.  Nose: Nose normal.  Mouth/Throat: Oropharynx is clear and moist.  Eyes: Conjunctivae and EOM are normal. Pupils are equal,  round, and reactive to light.  Neck: Normal range of motion. Neck supple.  Cardiovascular: Normal rate, regular rhythm, normal heart sounds and intact distal pulses.   Pulmonary/Chest: Effort normal and breath sounds normal.  Abdominal: Soft. Bowel sounds are normal.  Musculoskeletal:  Right shoulder swelling and tenderness.  Right hip with good rom.  Mild swelling right ankle (pt does not want xray of that).  Good rom of ankle.  Right buttock/thigh hematoma  Neurological: She is alert and oriented to person, place, and time.  Skin: Skin is warm and dry.  Psychiatric: She has a normal mood and affect. Her behavior is normal. Judgment and thought content normal.  Nursing note and vitals reviewed.    ED Treatments / Results  Labs (all labs ordered are listed, but only abnormal results are displayed) Labs Reviewed - No data to display  EKG  EKG Interpretation None       Radiology Dg Shoulder Right  Result Date: 01/25/2016 CLINICAL DATA:  Proximal right humeral pain after fall from bar stool today landing on right shoulder. Initial encounter. EXAM: RIGHT SHOULDER - 2+ VIEW COMPARISON:  None. FINDINGS: Two views study shows a comminuted proximal humerus fracture. Greater tuberosity is isolated as a free fragment. Acromioclavicular and coracoclavicular distances are preserved. Degenerative changes are noted in the glenohumeral joint. IMPRESSION: Comminuted surgical neck fracture of the proximal humerus. Electronically Signed   By: Kennith CenterEric  Mansell M.D.   On: 01/25/2016 18:19   Dg Hip Unilat  With Pelvis 2-3 Views Right  Addendum Date: 01/25/2016   ADDENDUM REPORT: 01/25/2016 19:37 ADDENDUM: The report for the shoulder x-ray was attached these films. The following is in regard to the pelvis and hip radiographs: Pelvis is intact. The right hip is located. No acute bone or soft tissue abnormality is present. Atherosclerotic calcifications are present. IMPRESSION: 1. No acute abnormality of the  hip or pelvis. 2. Atherosclerosis. Electronically Signed   By: Marin Robertshristopher  Mattern M.D.   On: 01/25/2016 19:37   Result Date: 01/25/2016 CLINICAL DATA:  Fall from bar stool.  Right shoulder pain. EXAM: DG HIP (WITH OR WITHOUT PELVIS) 2-3V RIGHT COMPARISON:  None. FINDINGS: A comminuted right humeral neck fracture is present. The shoulder remains located. Clavicle is intact. The visualized hemi thorax is clear. IMPRESSION: Comminuted right humeral neck fracture. Electronically Signed: By: Marin Robertshristopher  Mattern M.D. On: 01/25/2016 18:26    Procedures Procedures (including critical care time)  Medications Ordered in ED Medications  oxyCODONE (Oxy IR/ROXICODONE) immediate release tablet 5 mg (5 mg Oral Given 01/25/16 1951)  ibuprofen (ADVIL,MOTRIN) tablet 600 mg (600 mg Oral Given  01/25/16 1951)     Initial Impression / Assessment and Plan / ED Course  I have reviewed the triage vital signs and the nursing notes.  Pertinent labs & imaging results that were available during my care of the patient were reviewed by me and considered in my medical decision making (see chart for details).  Clinical Course  Value Comment By Time  DG Hip Unilat  With Pelvis 2-3 Views Right (Reviewed) Jacalyn LefevreJulie Jermiya Reichl, MD 08/15 [phone]    Pt's family is able to help pt at home.  Pt knows to return if worse.  She is given the number to ortho.  Final Clinical Impressions(s) / ED Diagnoses   Final diagnoses:  Proximal humerus fracture, right, closed, initial encounter  Traumatic hematoma of buttock, initial encounter  Fall, initial encounter    New Prescriptions New Prescriptions   OXYCODONE-ACETAMINOPHEN (PERCOCET/ROXICET) 5-325 MG TABLET    Take 1 tablet by mouth every 4 (four) hours as needed for severe pain.     Jacalyn LefevreJulie Jahzier Villalon, MD 01/25/16 [phone]

## 2016-01-25 NOTE — ED Triage Notes (Signed)
Pt comes in for a fall. She states she jumped off of her stool. Pt is having pain in her right hip and right shoulder. Pt has swelling noted to both of these areas. Pt denies hitting her head. Pt is on blood thinners.

## 2016-01-27 ENCOUNTER — Encounter: Payer: Self-pay | Admitting: Orthopedic Surgery

## 2016-01-27 ENCOUNTER — Ambulatory Visit (INDEPENDENT_AMBULATORY_CARE_PROVIDER_SITE_OTHER): Payer: PPO | Admitting: Orthopedic Surgery

## 2016-01-27 VITALS — BP 116/71 | HR 75 | Ht 63.5 in | Wt 145.0 lb

## 2016-01-27 DIAGNOSIS — S42201A Unspecified fracture of upper end of right humerus, initial encounter for closed fracture: Secondary | ICD-10-CM | POA: Diagnosis not present

## 2016-01-27 MED ORDER — HYDROCODONE-ACETAMINOPHEN 5-325 MG PO TABS: 1.0000 | ORAL_TABLET | Freq: Four times a day (QID) | ORAL | 0 refills | Status: DC | PRN

## 2016-01-27 NOTE — Progress Notes (Signed)
Chief Complaint  Patient presents with  . Arm Injury    RIGHT ARM FRACTURE, DOI 01/25/16   HPI-70 year old female presents after falling on August 15. Simple fall at home. Comes in complaining of right arm pain, right foot pain right thigh pain. X-ray of the proximal humerus of the right shoulder shows a comminuted nondisplaced fracture  Her pain is mild timing is constant location right shoulder quality dull throbbing ache  Review of Systems  Neurological: Negative for tingling.  Endo/Heme/Allergies: Bruises/bleeds easily.    Past Medical History:  Diagnosis Date  . A-fib (HCC)   . Anxiety   . CHF (congestive heart failure) (HCC)   . Hypertension     Past Surgical History:  Procedure Laterality Date  . TUBAL LIGATION     No family history on file. Social History  Substance Use Topics  . Smoking status: Former Smoker    Packs/day: 1.00    Types: Cigarettes    Start date: 05/17/1961    Quit date: 05/10/2014  . Smokeless tobacco: Never Used  . Alcohol use No    Current Outpatient Prescriptions:  .  busPIRone (BUSPAR) 5 MG tablet, take 1 tablet by mouth three times a day, Disp: 270 tablet, Rfl: 1 .  carvedilol (COREG) 12.5 MG tablet, take 1 tablet by mouth twice a day with meals, Disp: 180 tablet, Rfl: 2 .  DIGITEK 125 MCG tablet, take 1 tablet by mouth once daily, Disp: 90 tablet, Rfl: 2 .  escitalopram (LEXAPRO) 20 MG tablet, Take 1 tablet (20 mg total) by mouth at bedtime., Disp: 90 tablet, Rfl: 2 .  folic acid (FOLVITE) 1 MG tablet, take 1 tablet by mouth once daily, Disp: 30 tablet, Rfl: 6 .  lisinopril (PRINIVIL,ZESTRIL) 10 MG tablet, take 1 tablet twice a day, Disp: 180 tablet, Rfl: 2 .  Rivaroxaban (XARELTO) 15 MG TABS tablet, Take 1 tablet (15 mg total) by mouth daily with supper., Disp: 90 tablet, Rfl: 2 .  thiamine 100 MG tablet, Take 1 tablet (100 mg total) by mouth daily., Disp: 90 tablet, Rfl: 2 .  albuterol (PROAIR HFA) 108 (90 BASE) MCG/ACT inhaler, Inhale 2  puffs into the lungs every 4 (four) hours as needed., Disp: 6.7 g, Rfl: 0 .  fluticasone (FLONASE) 50 MCG/ACT nasal spray, Place 2 sprays into both nostrils daily., Disp: 16 g, Rfl: 1 .  HYDROcodone-acetaminophen (NORCO/VICODIN) 5-325 MG tablet, Take 1 tablet by mouth every 6 (six) hours as needed for moderate pain., Disp: 30 tablet, Rfl: 0  BP 116/71   Pulse 75   Ht 5' 3.5" (1.613 m)   Wt 145 lb (65.8 kg)   BMI 25.28 kg/m   Physical Exam  Constitutional: She is oriented to person, place, and time. She appears well-developed and well-nourished. No distress.  Cardiovascular: Normal rate and intact distal pulses.   Neurological: She is alert and oriented to person, place, and time. She has normal reflexes. She exhibits normal muscle tone. Coordination normal.  Skin: Skin is warm and dry. No rash noted. She is not diaphoretic. No erythema. No pallor.  Psychiatric: She has a normal mood and affect. Her behavior is normal. Judgment and thought content normal.    Ortho Exam Right shoulder no deformity is seen she has a lot of tenderness proximally. Elbow and hand range of motion is normal. Stability tests were deferred because of pain in the shoulder muscle tone was normal we did not test strength other than normal grip skin is ecchymotic and bruised  over the right shoulder. She has a good distal radial pulse and normal sensation over the right deltoid. Axillary lymph nodes were deferred supraclavicular lymph nodes were normal  She had a small hematoma over the right lateral ankle. The ankle joint stable the foot bones were palpated were nontender ankle range of motion was normal  Left shoulder normal improved have normal range of motion stability and strength  We changed her sling to fit  ASSESSMENT: My personal interpretation of the images:  Proximal humerus fracture nondisplaced greater tuberosity less tuberosity surgical neck. There is no displacement or angulation    PLAN Sling 2  weeks  X-ray in 2 weeks  Switch pain medicine Percocet Norco 5 mg  Fuller CanadaStanley Skilar Marcou, MD 01/27/2016 5:22 PM

## 2016-01-28 ENCOUNTER — Telehealth: Payer: Self-pay | Admitting: Cardiovascular Disease

## 2016-01-28 NOTE — Telephone Encounter (Signed)
Patient tripped and fell, received Comminuted surgical neck fracture of the proximal humerus 3 days ago. Saw Dr Kerry HoughHarrison,is on pain medication.takes Xarelto.Has bruising on arm but it has not changed since injury.she will call back if anything changes

## 2016-02-04 ENCOUNTER — Other Ambulatory Visit: Payer: Self-pay | Admitting: Cardiovascular Disease

## 2016-02-10 ENCOUNTER — Ambulatory Visit (INDEPENDENT_AMBULATORY_CARE_PROVIDER_SITE_OTHER): Payer: PPO | Admitting: Orthopedic Surgery

## 2016-02-10 ENCOUNTER — Ambulatory Visit (INDEPENDENT_AMBULATORY_CARE_PROVIDER_SITE_OTHER): Payer: PPO

## 2016-02-10 ENCOUNTER — Encounter: Payer: Self-pay | Admitting: Orthopedic Surgery

## 2016-02-10 VITALS — BP 156/89 | HR 71 | Ht 63.5 in | Wt 145.0 lb

## 2016-02-10 DIAGNOSIS — S42201D Unspecified fracture of upper end of right humerus, subsequent encounter for fracture with routine healing: Secondary | ICD-10-CM

## 2016-02-10 DIAGNOSIS — S4291XD Fracture of right shoulder girdle, part unspecified, subsequent encounter for fracture with routine healing: Secondary | ICD-10-CM | POA: Diagnosis not present

## 2016-02-10 NOTE — Progress Notes (Signed)
Fracture care follow-up right proximal humerus fracture  Patient's pain has improved  Recommend follow-up in 8 weeks  She can start occupational therapy  She can remove the sling  X-ray today showed fracture healing with slight varus and slight posterior angulation

## 2016-02-10 NOTE — Patient Instructions (Signed)
Start occupational therapy 

## 2016-02-21 ENCOUNTER — Ambulatory Visit (HOSPITAL_COMMUNITY): Payer: PPO | Attending: Orthopedic Surgery | Admitting: Specialist

## 2016-02-21 DIAGNOSIS — M25611 Stiffness of right shoulder, not elsewhere classified: Secondary | ICD-10-CM | POA: Diagnosis not present

## 2016-02-21 DIAGNOSIS — R29898 Other symptoms and signs involving the musculoskeletal system: Secondary | ICD-10-CM | POA: Diagnosis not present

## 2016-02-21 DIAGNOSIS — M25511 Pain in right shoulder: Secondary | ICD-10-CM

## 2016-02-21 NOTE — Therapy (Signed)
[facility] [address] [place], [place], [address] Phone: [phone]   Fax:  [phone]  Occupational Therapy Evaluation  Patient Details  Name: Rebecca FlirtLinda Huerta MRN: [id number] Date of Birth: 05/31/1946 Referring Provider: Dr. Fuller CanadaStanley Harrison  Encounter Date: 02/21/2016      OT End of Session - 02/21/16 1519    Visit Number 1   Number of Visits 24   Date for OT Re-Evaluation 04/21/16  mini reassess on 03/22/16   Authorization Type [employer]   Authorization Time Period before 10th visit   Authorization - Visit Number 1   Authorization - Number of Visits 10   OT Start Time 1300   OT Stop Time 1345   OT Time Calculation (min) 45 min   Activity Tolerance Patient tolerated treatment well   Behavior [facility] for tasks assessed/performed      Past Medical History:  Diagnosis Date  . A-fib (HCC)   . Anxiety   . CHF (congestive heart failure) (HCC)   . Hypertension     Past Surgical History:  Procedure Laterality Date  . TUBAL LIGATION      There were no vitals filed for this visit.      Subjective Assessment - 02/21/16 1509    Subjective  S:  I want to be able to do everything I want to do.   Pertinent History On 01/25/16, Rebecca Huerta was walking in her kitchen when she fell, landing on her right side.  She was taken to the ED and through xray was diagnosed with a right proximal humerus fracture with 4 fractures noted.  Her right arm was placed in a sling, which was discontinued last week.  She has been referred to occupational therapy for evaluation and treatment by Dr. Romeo AppleHarrison.     Special Tests FOTO 44/100   Patient Stated Goals I want to be able to reach behind my back.   Currently in Pain? Yes   Pain Score 4    Pain Location Shoulder   Pain Orientation Right   Pain Descriptors / Indicators Aching;Constant   Pain Type Acute pain   Pain Onset 1 to 4 weeks ago   Aggravating Factors   movement   Pain Relieving Factors rest, ice   Effect of Pain on Daily Activities unable to use right arm as dominant with daily tasks           [facility] - 02/21/16 0001      Assessment   Diagnosis Right Proximal Humerus Fracture   Referring Provider Dr. Fuller CanadaStanley Harrison   Onset Date 01/25/16   Prior Therapy n/a     Precautions   Precautions Shoulder   Precaution Comments PROM through 9/25, progress to AA/ROM and A/ROM as tolerated     Restrictions   Weight Bearing Restrictions No     Balance Screen   Has the patient fallen in the past 6 months Yes   How many times? 1   Has the patient had a decrease in activity level because of a fear of falling?  No   Is the patient reluctant to leave their home because of a fear of falling?  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   Leisure TV      ADL   ADL comments unable to use her dominant right arm with functional activities above waist height     Written Expression   Dominant Hand Right     Vision - History   Baseline Vision No visual deficits     Cognition   Overall Cognitive Status Within Functional Limits for tasks assessed     Observation/Other Assessments   Skin Integrity significant bruising along entire right arm   Focus on Therapeutic Outcomes (FOTO)  44/100     Sensation   Light Touch Appears Intact     Coordination   Gross Motor Movements are Fluid and Coordinated Yes   Fine Motor Movements are Fluid and Coordinated Yes     ROM / Strength   AROM / PROM / Strength AROM;PROM;Strength     Palpation   Palpation comment moderate fascial restrictions noted in right upper arm, scapular, and shoulder region      AROM    Overall AROM Comments assessed in supine, external rotation and internal rotation with shoulder adducted    AROM Assessment Site Shoulder   Right/Left Shoulder Right   Right Shoulder Flexion 0 Degrees   Right Shoulder ABduction 30 Degrees   Right Shoulder Internal Rotation 75 Degrees   Right Shoulder External Rotation 25 Degrees     PROM   Overall PROM Comments assessed in supine, external rotation and internal rotation with shoulder adducted   PROM Assessment Site Shoulder   Right/Left Shoulder Right   Right Shoulder Flexion 100 Degrees   Right Shoulder ABduction 70 Degrees   Right Shoulder Internal Rotation 75 Degrees   Right Shoulder External Rotation 40 Degrees     Strength   Overall Strength Comments strength not assessed due to recent fracture   Strength Assessment Site Shoulder   Right/Left Shoulder Right                  OT Treatments/Exercises (OP) - 02/21/16 0001      Manual Therapy   Manual Therapy Myofascial release   Manual therapy comments manual therapy completed seperately from all other interventions and assessments this date   Myofascial Release myofascial release and manual stretching to right upper arm, scapular, shoulder region and associated areas to decrease fascial restrictions and improve pain free mobility in right shoulder and arm                OT Education - 02/21/16 1519    Education provided Yes   Education Details educated patient on towel slides for shoulder flexion, abduction, external rotation    Person(s) Educated Patient   Methods Explanation;Demonstration;Handout   Comprehension Verbalized understanding;Returned demonstration          OT Short Term Goals - 02/21/16 1525      OT SHORT TERM GOAL #1   Title Patient will be educated on a HEP.   Time 4   Period Weeks   Status New     OT SHORT TERM GOAL #2   Title Patient will improve right shoulder P/ROM to [facility] in order to improve ability to don and doff clothing  with increased ease and less pain.    Time 4   Period Weeks   Status New     OT SHORT TERM GOAL #3   Title Patient will improve right shoulder strength to 3+/5 for improved ability to lift pots  and pans when cooking.    Time 4   Period Weeks   Status New     OT SHORT TERM GOAL #4   Title Patient will decrease pain in her right shoulder to 3/10 or better while she is completing ADL tasks.   Time 4   Period Weeks   Status New     OT SHORT TERM GOAL #5   Title Patient will decrease fascial restrictions to min-mod in her right shoulder region for greater mobility needed for ADL completion.    Time 4   Period Weeks   Status New           OT Long Term Goals - 02/21/16 1529      OT LONG TERM GOAL #1   Title Patient will use her right arm as dominant with all functional, work, and leisure activities.    Time 8   Period Weeks   Status New     OT LONG TERM GOAL #2   Title Patient will improve right shoulder A/ROM to [facility] in order to reach behind her back and over her head during ADL completion.   Time 8   Period Weeks   Status New     OT LONG TERM GOAL #3   Title Patient will improve right shoulder strength to 4+/5 or better in order to be able to use her right arm to lift bags of groceries.   Time 8   Period Weeks   Status New     OT LONG TERM GOAL #4   Title Patient will decrease pain in her right shoulder to 2/10 or better when completing ADLs.   Time 8   Period Weeks   Status New     OT LONG TERM GOAL #5   Title Patient will decrease fascial restrctions to trace in her right shoulder region for greater mobility needed for ADL and work activity completion.    Time 8   Period Weeks   Status New               Plan - 02/21/16 1520    Clinical Impression Statement A:  Patient is a 70 year old female with past medical history that includes a-fib, heart failure, malnutrition that fell and fractured her right proximal humerus on 01/25/16.  Ms. Lamar lives alone  and is not able to use her dominant right arm with any B/IADLs, work, or leisure activities.  She is compensating by using her left arm as dominant with B/ADLs and is not able to work.     Rehab Potential Good   OT Frequency 3x / week   OT Duration 8 weeks   OT Treatment/Interventions Self-care/ADL training;Cryotherapy;Electrical Stimulation;Moist Heat;Ultrasound;Energy conservation;Therapeutic exercise;Neuromuscular education;Manual Therapy;Passive range of motion;Therapeutic exercises;Therapeutic activities;Patient/family education   Plan P:  Patient will benefit from skilled OT intervention to decrease pain, fascial restrictions and improve pain free mobility and strength in patient's dominant right arm in order to return to prior level of independence with all B/IADLs, work, and leisure activities.  Next session:  review HEP and treatment plan, begin P/ROM, scapular A/ROM, ball stretches.   OT Home Exercise Plan table slides   Consulted and Agree with Plan of Care Patient      Patient will benefit from skilled therapeutic intervention in order to improve the following deficits and impairments:  Decreased range of motion, Decreased strength, Decreased skin integrity, Increased edema, Increased fascial restricitons, Increased muscle spasms, Pain, Impaired flexibility  Visit Diagnosis: Pain in  right shoulder - Plan: Ot plan of care cert/re-cert  Stiffness of right shoulder, not elsewhere classified - Plan: Ot plan of care cert/re-cert  Other symptoms and signs involving the musculoskeletal system - Plan: Ot plan of care cert/re-cert      G-Codes - Mar 20, 2016 1533    Functional Assessment Tool Used FOTO 44/100, 56% limited   Functional Limitation Self care   Self Care Current Status (N8295) At least 40 percent but less than 60 percent impaired, limited or restricted   Self Care Goal Status (A2130) At least 20 percent but less than 40 percent impaired, limited or restricted      Problem  List Patient Active Problem List   Diagnosis Date Noted  . Cardiomyopathy (HCC) 06/09/2014  . Chronic systolic heart failure (HCC) 06/09/2014  . Malnutrition (HCC) 06/09/2014  . Rash 06/09/2014  . Anticoagulation adequate 06/09/2014  . Pulmonary hypertension (HCC)   . Acute on chronic systolic CHF (congestive heart failure) (HCC) 05/12/2014  . Benign essential HTN 05/12/2014  . Anxiety state 05/12/2014  . Atrial fibrillation with RVR (HCC) 05/12/2014  . Postoperative pulmonary edema (HCC) 05/12/2014  . Pleural effusion 05/12/2014  . Hypokalemia 05/12/2014  . Hyponatremia 05/12/2014  . A-fib (HCC) 05/11/2014  . Edema leg 05/11/2014  . Pulmonary edema 05/11/2014  . Tobacco abuse 05/11/2014  . Alcohol abuse 05/11/2014    Shirlean Mylar, [place], OTR/L [phone]  Mar 20, 2016, 3:37 PM  [facility] [address] [place], [place], [address] Phone: [phone]   Fax:  [phone]  Name: Amahia Madonia MRN: [id number] Date of Birth: 26-Apr-1946

## 2016-02-21 NOTE — Patient Instructions (Signed)
COMPLETE each exercise for 1-3 minutes, 2-3 times per day  SHOULDER: Flexion On Table   Place hands on table, elbows straight. Move hips away from body. Press hands down into table. Hold ___ seconds. ___ reps per set, ___ sets per day, ___ days per week  Abduction (Passive)   With arm out to side, resting on table, lower head toward arm, keeping trunk away from table. Hold ____ seconds. Repeat ____ times. Do ____ sessions per day.  Copyright  VHI. All rights reserved.     Internal Rotation (Assistive)   Seated with elbow bent at right angle and held against side, slide arm on table surface in an inward arc. Repeat ____ times. Do ____ sessions per day. Activity: Use this motion to brush crumbs off the table.  Copyright  VHI. All rights reserved.

## 2016-02-23 ENCOUNTER — Encounter (HOSPITAL_COMMUNITY): Payer: Self-pay

## 2016-02-23 ENCOUNTER — Ambulatory Visit (HOSPITAL_COMMUNITY): Payer: PPO

## 2016-02-23 DIAGNOSIS — M25511 Pain in right shoulder: Secondary | ICD-10-CM | POA: Diagnosis not present

## 2016-02-23 DIAGNOSIS — R29898 Other symptoms and signs involving the musculoskeletal system: Secondary | ICD-10-CM

## 2016-02-23 DIAGNOSIS — M25611 Stiffness of right shoulder, not elsewhere classified: Secondary | ICD-10-CM

## 2016-02-23 NOTE — Therapy (Addendum)
[place], [place], [address] Phone: [phone]   Fax:  [phone]  Occupational Therapy Treatment  Patient Details  Name: Rebecca Huerta MRN: [id number] Date of Birth: 1946-04-15 Referring Provider: Dr. Arther Abbott  Encounter Date: 02/23/2016      OT End of Session - 02/23/16 1404    Visit Number 2   Number of Visits 24   Date for OT Re-Evaluation 04/21/16  mini reassess on 03/22/16   Authorization Type [employer]   Authorization Time Period before 10th visit   Authorization - Visit Number 2   Authorization - Number of Visits 10   OT Start Time 1300   OT Stop Time 1345   OT Time Calculation (min) 45 min   Activity Tolerance Patient tolerated treatment well   Behavior During Therapy [facility] for tasks assessed/performed      Past Medical History:  Diagnosis Date  . A-fib ([place])   . Anxiety   . CHF (congestive heart failure) ([place])   . Hypertension     Past Surgical History:  Procedure Laterality Date  . TUBAL LIGATION      There were no vitals filed for this visit.      Subjective Assessment - 02/23/16 1403    Subjective  S: It only hurts if I mess with is.   Currently in Pain? No/denies            [facility] OT Assessment - 02/23/16 1329      Assessment   Diagnosis Right Proximal Humerus Fracture     Precautions   Precautions Shoulder   Precaution Comments PROM through 9/25, progress to AA/ROM and A/ROM as tolerated                  OT Treatments/Exercises (OP) - 02/23/16 1331      Exercises   Exercises Shoulder     Shoulder Exercises: Supine   Protraction PROM;10 reps   Horizontal ABduction PROM;10 reps   External Rotation PROM;10 reps   Internal Rotation PROM;10 reps   Flexion PROM;10 reps   ABduction PROM;10 reps     Shoulder Exercises: Seated   Elevation AROM;10 reps   Extension AROM;10 reps   Row AROM;10 reps     Shoulder Exercises:  Therapy Ball   Flexion 10 reps   ABduction 10 reps     Shoulder Exercises: ROM/Strengthening   Anterior Glide 3x10"     Shoulder Exercises: Isometric Strengthening   Flexion Supine;3X3"   Extension Supine;3X3"   External Rotation Supine;3X3"   Internal Rotation Supine;3X3"   ABduction Supine;3X3"   ADduction Supine;3X3"     Manual Therapy   Manual Therapy Myofascial release   Manual therapy comments manual therapy completed seperately from all other interventions and assessments this date   Myofascial Release myofascial release and manual stretching to right upper arm, scapular, shoulder region and associated areas to decrease fascial restrictions and improve pain free mobility in right shoulder and arm                 OT Education - 02/23/16 1403    Education provided Yes   Education Details Patient was given OT eval print out and goals were reviewed. educated patient regarding P/ROM precautions and what activities she is allowed to complete per Dr. Aline Brochure. patient was unaware that she was not supposed to be using her RUE as normal.    Person(s) Educated Patient   Methods Explanation;Handout  Comprehension Verbalized understanding          OT Short Term Goals - 02/23/16 1412      OT SHORT TERM GOAL #1   Title Patient will be educated on a HEP.   Time 4   Period Weeks   Status On-going     OT SHORT TERM GOAL #2   Title Patient will improve right shoulder P/ROM to [facility] in order to improve ability to don and doff clothing with increased ease and less pain.    Time 4   Period Weeks   Status On-going     OT SHORT TERM GOAL #3   Title Patient will improve right shoulder strength to 3+/5 for improved ability to lift pots and pans when cooking.    Time 4   Period Weeks   Status On-going     OT SHORT TERM GOAL #4   Title Patient will decrease pain in her right shoulder to 3/10 or better while she is completing ADL tasks.   Time 4   Period Weeks   Status  On-going     OT SHORT TERM GOAL #5   Title Patient will decrease fascial restrictions to min-mod in her right shoulder region for greater mobility needed for ADL completion.    Time 4   Period Weeks   Status On-going           OT Long Term Goals - 02/23/16 1413      OT LONG TERM GOAL #1   Title Patient will use her right arm as dominant with all functional, work, and leisure activities.    Time 8   Period Weeks   Status On-going     OT LONG TERM GOAL #2   Title Patient will improve right shoulder A/ROM to [facility] in order to reach behind her back and over her head during ADL completion.   Time 8   Period Weeks   Status On-going     OT LONG TERM GOAL #3   Title Patient will improve right shoulder strength to 4+/5 or better in order to be able to use her right arm to lift bags of groceries.   Time 8   Period Weeks   Status On-going     OT LONG TERM GOAL #4   Title Patient will decrease pain in her right shoulder to 2/10 or better when completing ADLs.   Time 8   Period Weeks   Status On-going     OT LONG TERM GOAL #5   Title Patient will decrease fascial restrctions to trace in her right shoulder region for greater mobility needed for ADL and work activity completion.    Time 8   Period Weeks   Status On-going               Plan - 02/23/16 1408    Clinical Impression Statement A: Initiated myofascial release, manual stretching, P/ROM, isometric exercises and therapy ball stretches. Patient required education during session regarding precautions and protocol established by MD. It was also noted to patient she needs to complete her HEP at home as just coming to therapy will not be adequate enough to get back to where she wants to be. Pt hasa large therapy ball at home and therapist encouraged her to complete her stretches at home with the ball versus the table stretches if she prefers.   Clinical Impairments Affecting Rehab Potential Patient is hypersensitive to touch.  Patient's acceptance of the benefit of therapy exercises is  low.    Plan P: Add thumb tacks and pro/ret/elev/dep.   OT Home Exercise Plan table slides      Patient will benefit from skilled therapeutic intervention in order to improve the following deficits and impairments:  Decreased range of motion, Decreased strength, Decreased skin integrity, Increased edema, Increased fascial restricitons, Increased muscle spasms, Pain, Impaired flexibility  Visit Diagnosis: Stiffness of right shoulder, not elsewhere classified  Other symptoms and signs involving the musculoskeletal system    Problem List Patient Active Problem List   Diagnosis Date Noted  . Cardiomyopathy ([place]) 06/09/2014  . Chronic systolic heart failure ([place]) 06/09/2014  . Malnutrition ([place]) 06/09/2014  . Rash 06/09/2014  . Anticoagulation adequate 06/09/2014  . Pulmonary hypertension ([place])   . Acute on chronic systolic CHF (congestive heart failure) ([place]) 05/12/2014  . Benign essential HTN 05/12/2014  . Anxiety state 05/12/2014  . Atrial fibrillation with RVR ([place]) 05/12/2014  . Postoperative pulmonary edema ([place]) 05/12/2014  . Pleural effusion 05/12/2014  . Hypokalemia 05/12/2014  . Hyponatremia 05/12/2014  . A-fib ([place]) 05/11/2014  . Edema leg 05/11/2014  . Pulmonary edema 05/11/2014  . Tobacco abuse 05/11/2014  . Alcohol abuse 05/11/2014   Ailene Ravel, OTR/L,CBIS  [phone]  02/23/2016, 3:42 PM  [facility] [address] [place], [place], [address] Phone: [phone]   Fax:  [phone]  Name: Vila Dory MRN: [id number] Date of Birth: 1946/01/01

## 2016-02-25 ENCOUNTER — Ambulatory Visit (HOSPITAL_COMMUNITY): Payer: PPO | Admitting: Specialist

## 2016-02-25 DIAGNOSIS — R29898 Other symptoms and signs involving the musculoskeletal system: Secondary | ICD-10-CM

## 2016-02-25 DIAGNOSIS — M25611 Stiffness of right shoulder, not elsewhere classified: Secondary | ICD-10-CM

## 2016-02-25 DIAGNOSIS — M25511 Pain in right shoulder: Secondary | ICD-10-CM

## 2016-02-25 NOTE — Therapy (Signed)
[place], [place], [address] Phone: [phone]   Fax:  [phone]  Occupational Therapy Treatment  Patient Details  Name: Rebecca Huerta MRN: [id number] Date of Birth: [birth year] Referring Provider: Dr. Arther Abbott  Encounter Date: 02/25/2016      OT End of Session - 02/25/16 1555    Visit Number 3   Number of Visits 24   Date for OT Re-Evaluation 04/21/16  mini reassess on 03/22/16   Authorization Type [employer]   Authorization Time Period before 10th visit   Authorization - Visit Number 3   Authorization - Number of Visits 10   OT Start Time 1436   OT Stop Time 1517   OT Time Calculation (min) 41 min   Behavior During Therapy [facility] for tasks assessed/performed      Past Medical History:  Diagnosis Date  . A-fib ([place])   . Anxiety   . CHF (congestive heart failure) ([place])   . Hypertension     Past Surgical History:  Procedure Laterality Date  . TUBAL LIGATION      There were no vitals filed for this visit.      Subjective Assessment - 02/25/16 1438    Subjective  S:  Im doing the exercises.   Currently in Pain? No/denies   Pain Score 0-No pain            [facility] OT Assessment - 02/25/16 0001      Assessment   Diagnosis Right Proximal Humerus Fracture     Precautions   Precautions Shoulder   Precaution Comments PROM through 9/25, progress to AA/ROM and A/ROM as tolerated                  OT Treatments/Exercises (OP) - 02/25/16 0001      Exercises   Exercises Shoulder     Shoulder Exercises: Supine   Protraction PROM;10 reps   Horizontal ABduction PROM;10 reps   External Rotation PROM;10 reps   Internal Rotation PROM;10 reps   Flexion PROM;10 reps   ABduction PROM;10 reps     Shoulder Exercises: Seated   Elevation AROM;10 reps   Extension AROM;10 reps   Row AROM;10 reps   Other Seated Exercises A/ROM elbow flexion/extension,  supination/pronation, wrist flexion/extension 10 times      Shoulder Exercises: Therapy Ball   Flexion 15 reps   ABduction 15 reps     Shoulder Exercises: ROM/Strengthening   Anterior Glide 3x10"     Shoulder Exercises: Isometric Strengthening   Flexion Supine;3X5"   Extension Supine;3X5"   External Rotation Supine;3X5"   Internal Rotation Supine;3X5"   ABduction Supine;3X5"   ADduction Supine;3X5"     Manual Therapy   Manual Therapy Myofascial release   Manual therapy comments manual therapy completed seperately from all other interventions and assessments this date   Myofascial Release myofascial release and manual stretching to right upper arm, scapular, shoulder region and associated areas to decrease fascial restrictions and improve pain free mobility in right shoulder and arm                   OT Short Term Goals - 02/23/16 1412      OT SHORT TERM GOAL #1   Title Patient will be educated on a HEP.   Time 4   Period Weeks   Status On-going     OT SHORT TERM GOAL #2   Title Patient will improve right shoulder P/ROM to  WFL in order to improve ability to don and doff clothing with increased ease and less pain.    Time 4   Period Weeks   Status On-going     OT SHORT TERM GOAL #3   Title Patient will improve right shoulder strength to 3+/5 for improved ability to lift pots and pans when cooking.    Time 4   Period Weeks   Status On-going     OT SHORT TERM GOAL #4   Title Patient will decrease pain in her right shoulder to 3/10 or better while she is completing ADL tasks.   Time 4   Period Weeks   Status On-going     OT SHORT TERM GOAL #5   Title Patient will decrease fascial restrictions to min-mod in her right shoulder region for greater mobility needed for ADL completion.    Time 4   Period Weeks   Status On-going           OT Long Term Goals - 02/23/16 1413      OT LONG TERM GOAL #1   Title Patient will use her right arm as dominant with  all functional, work, and leisure activities.    Time 8   Period Weeks   Status On-going     OT LONG TERM GOAL #2   Title Patient will improve right shoulder A/ROM to [facility] in order to reach behind her back and over her head during ADL completion.   Time 8   Period Weeks   Status On-going     OT LONG TERM GOAL #3   Title Patient will improve right shoulder strength to 4+/5 or better in order to be able to use her right arm to lift bags of groceries.   Time 8   Period Weeks   Status On-going     OT LONG TERM GOAL #4   Title Patient will decrease pain in her right shoulder to 2/10 or better when completing ADLs.   Time 8   Period Weeks   Status On-going     OT LONG TERM GOAL #5   Title Patient will decrease fascial restrctions to trace in her right shoulder region for greater mobility needed for ADL and work activity completion.    Time 8   Period Weeks   Status On-going               Plan - 02/25/16 1556    Clinical Impression Statement A:  Patient tolerable of P/ROM in supine through 60%-75% range.  Patient has improved P/ROM from initial evaluation.  Patient agreeable to manual therapy techniques this date.  Encouraged patient to complete therapy ball stretches at home.    Plan P:  Add thumb tacks and prot/ret//elev/dep.  Folloow up on HEP completion.       Patient will benefit from skilled therapeutic intervention in order to improve the following deficits and impairments:  Decreased range of motion, Decreased strength, Decreased skin integrity, Increased edema, Increased fascial restricitons, Increased muscle spasms, Pain, Impaired flexibility  Visit Diagnosis: Stiffness of right shoulder, not elsewhere classified  Other symptoms and signs involving the musculoskeletal system  Pain in right shoulder    Problem List Patient Active Problem List   Diagnosis Date Noted  . Cardiomyopathy ([place]) 06/09/2014  . Chronic systolic heart failure ([place]) 06/09/2014  .  Malnutrition ([place]) 06/09/2014  . Rash 06/09/2014  . Anticoagulation adequate 06/09/2014  . Pulmonary hypertension ([place])   . Acute on chronic systolic CHF (  congestive heart failure) ([place]) 05/12/2014  . Benign essential HTN 05/12/2014  . Anxiety state 05/12/2014  . Atrial fibrillation with RVR ([place]) 05/12/2014  . Postoperative pulmonary edema ([place]) 05/12/2014  . Pleural effusion 05/12/2014  . Hypokalemia 05/12/2014  . Hyponatremia 05/12/2014  . A-fib ([place]) 05/11/2014  . Edema leg 05/11/2014  . Pulmonary edema 05/11/2014  . Tobacco abuse 05/11/2014  . Alcohol abuse 05/11/2014    Vangie Bicker, [place], OTR/L [phone]  02/25/2016, 3:59 PM  [facility] [address] [place], [place], [address] Phone: [phone]   Fax:  [phone]  Name: Rebecca Huerta MRN: [id number] Date of Birth: 23-Feb-1946

## 2016-02-29 ENCOUNTER — Ambulatory Visit (HOSPITAL_COMMUNITY): Payer: PPO | Admitting: Occupational Therapy

## 2016-02-29 ENCOUNTER — Encounter (HOSPITAL_COMMUNITY): Payer: Self-pay | Admitting: Occupational Therapy

## 2016-02-29 DIAGNOSIS — M25511 Pain in right shoulder: Secondary | ICD-10-CM | POA: Diagnosis not present

## 2016-02-29 DIAGNOSIS — R29898 Other symptoms and signs involving the musculoskeletal system: Secondary | ICD-10-CM

## 2016-02-29 DIAGNOSIS — M25611 Stiffness of right shoulder, not elsewhere classified: Secondary | ICD-10-CM
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
## 2016-03-02 ENCOUNTER — Ambulatory Visit (HOSPITAL_COMMUNITY): Payer: PPO | Admitting: Specialist

## 2016-03-02 DIAGNOSIS — R29898 Other symptoms and signs involving the musculoskeletal system: Secondary | ICD-10-CM

## 2016-03-02 DIAGNOSIS — M25611 Stiffness of right shoulder, not elsewhere classified: Secondary | ICD-10-CM

## 2016-03-02 DIAGNOSIS — M25511 Pain in right shoulder: Secondary | ICD-10-CM | POA: Diagnosis not present

## 2016-03-02 NOTE — Therapy (Signed)
[place], [place], [address] Phone: [phone]   Fax:  [phone]  Occupational Therapy Treatment  Patient Details  Name: Rebecca Huerta MRN: [id number] Date of Birth: 1946/02/14 Referring Provider: Dr. Arther Abbott  Encounter Date: 03/02/2016      OT End of Session - 03/02/16 1437    Visit Number 5   Number of Visits 24   Date for OT Re-Evaluation 04/21/16  mini reassess on 10/11   Authorization Type [employer]   Authorization Time Period before 10th visit   Authorization - Visit Number 5   Authorization - Number of Visits 10   OT Start Time 1300   OT Stop Time 1345   OT Time Calculation (min) 45 min   Activity Tolerance Patient tolerated treatment well   Behavior During Therapy [facility] for tasks assessed/performed      Past Medical History:  Diagnosis Date  . A-fib ([place])   . Anxiety   . CHF (congestive heart failure) ([place])   . Hypertension     Past Surgical History:  Procedure Laterality Date  . TUBAL LIGATION      There were no vitals filed for this visit.      Subjective Assessment - 03/02/16 1302    Subjective  S:  Its ok   Currently in Pain? Yes   Pain Score 7    Pain Location Shoulder   Pain Orientation Right   Pain Descriptors / Indicators Aching   Pain Type Acute pain   Pain Onset 1 to 4 weeks ago   Pain Frequency Intermittent   Aggravating Factors  position when sleeping            [facility] OT Assessment - 03/02/16 0001      Assessment   Diagnosis Right Proximal Humerus Fracture     Precautions   Precautions Shoulder   Precaution Comments PROM through 9/25, progress to AA/ROM and A/ROM as tolerated                  OT Treatments/Exercises (OP) - 03/02/16 0001      Exercises   Exercises Shoulder     Shoulder Exercises: Supine   Protraction PROM;10 reps   Horizontal ABduction PROM;10 reps   External Rotation PROM;10 reps   Internal  Rotation PROM;10 reps   Flexion PROM;10 reps   ABduction PROM;10 reps     Shoulder Exercises: Seated   Elevation AROM;15 reps   Extension AROM;15 reps   Row AROM;15 reps   Other Seated Exercises A/ROM elbow flexion/extension, supination/pronation, wrist flexion/extension 10 times      Shoulder Exercises: Therapy Ball   Flexion 15 reps   ABduction 15 reps     Shoulder Exercises: ROM/Strengthening   Thumb Tacks low thumb tacks 1'   Prot/Ret//Elev/Dep low prt/ret/elev/dep 1 minute with good form with minimal verbal cueing     Shoulder Exercises: Isometric Strengthening   Flexion Supine  3 X 10"   Extension Supine  3 X 10"   External Rotation Supine  3 X 10"   Internal Rotation Supine  3 X 10"   ABduction Supine  3 X 10"   ADduction Supine  3 X 10"     Manual Therapy   Manual Therapy Myofascial release   Manual therapy comments manual therapy completed seperately from all other interventions and assessments this date   Myofascial Release myofascial release and manual stretching to right upper arm, scapular, shoulder region and  associated areas to decrease fascial restrictions and improve pain free mobility in right shoulder and arm                   OT Short Term Goals - 02/23/16 1412      OT SHORT TERM GOAL #1   Title Patient will be educated on a HEP.   Time 4   Period Weeks   Status On-going     OT SHORT TERM GOAL #2   Title Patient will improve right shoulder P/ROM to [facility] in order to improve ability to don and doff clothing with increased ease and less pain.    Time 4   Period Weeks   Status On-going     OT SHORT TERM GOAL #3   Title Patient will improve right shoulder strength to 3+/5 for improved ability to lift pots and pans when cooking.    Time 4   Period Weeks   Status On-going     OT SHORT TERM GOAL #4   Title Patient will decrease pain in her right shoulder to 3/10 or better while she is completing ADL tasks.   Time 4   Period Weeks    Status On-going     OT SHORT TERM GOAL #5   Title Patient will decrease fascial restrictions to min-mod in her right shoulder region for greater mobility needed for ADL completion.    Time 4   Period Weeks   Status On-going           OT Long Term Goals - 02/23/16 1413      OT LONG TERM GOAL #1   Title Patient will use her right arm as dominant with all functional, work, and leisure activities.    Time 8   Period Weeks   Status On-going     OT LONG TERM GOAL #2   Title Patient will improve right shoulder A/ROM to [facility] in order to reach behind her back and over her head during ADL completion.   Time 8   Period Weeks   Status On-going     OT LONG TERM GOAL #3   Title Patient will improve right shoulder strength to 4+/5 or better in order to be able to use her right arm to lift bags of groceries.   Time 8   Period Weeks   Status On-going     OT LONG TERM GOAL #4   Title Patient will decrease pain in her right shoulder to 2/10 or better when completing ADLs.   Time 8   Period Weeks   Status On-going     OT LONG TERM GOAL #5   Title Patient will decrease fascial restrctions to trace in her right shoulder region for greater mobility needed for ADL and work activity completion.    Time 8   Period Weeks   Status On-going               Plan - 03/02/16 1437    Clinical Impression Statement A:  patient more relaxed and open to P/ROM this date.  requires verbal guidance to slow rate of completion of ball stretches in order to recieve the full benefit of the stretch.  able to complete prot/ret/elev/dep in a low position this date.    Plan P:  increase repetions of isometric strengthening. Improve P/ROM to 80% range or better in prep for AA/ROM beginning on 03/06/16.   Consulted and Agree with Plan of Care Patient      Patient  will benefit from skilled therapeutic intervention in order to improve the following deficits and impairments:  Decreased range of motion, Decreased  strength, Decreased skin integrity, Increased edema, Increased fascial restricitons, Increased muscle spasms, Pain, Impaired flexibility  Visit Diagnosis: Stiffness of right shoulder, not elsewhere classified  Other symptoms and signs involving the musculoskeletal system  Pain in right shoulder    Problem List Patient Active Problem List   Diagnosis Date Noted  . Cardiomyopathy ([place]) 06/09/2014  . Chronic systolic heart failure ([place]) 06/09/2014  . Malnutrition ([place]) 06/09/2014  . Rash 06/09/2014  . Anticoagulation adequate 06/09/2014  . Pulmonary hypertension ([place])   . Acute on chronic systolic CHF (congestive heart failure) ([place]) 05/12/2014  . Benign essential HTN 05/12/2014  . Anxiety state 05/12/2014  . Atrial fibrillation with RVR ([place]) 05/12/2014  . Postoperative pulmonary edema ([place]) 05/12/2014  . Pleural effusion 05/12/2014  . Hypokalemia 05/12/2014  . Hyponatremia 05/12/2014  . A-fib ([place]) 05/11/2014  . Edema leg 05/11/2014  . Pulmonary edema 05/11/2014  . Tobacco abuse 05/11/2014  . Alcohol abuse 05/11/2014    Vangie Bicker, [place], OTR/L [phone]  03/02/2016, 2:41 PM  [facility] [address] [place], [place], [address] Phone: [phone]   Fax:  [phone]  Name: Anjelika Ausburn MRN: [id number] Date of Birth: 10/28/1945

## 2016-03-03 ENCOUNTER — Ambulatory Visit (HOSPITAL_COMMUNITY): Payer: PPO | Admitting: Occupational Therapy

## 2016-03-03 ENCOUNTER — Encounter (HOSPITAL_COMMUNITY): Payer: Self-pay | Admitting: Occupational Therapy

## 2016-03-03 DIAGNOSIS — M25511 Pain in right shoulder: Secondary | ICD-10-CM

## 2016-03-03 DIAGNOSIS — R29898 Other symptoms and signs involving the musculoskeletal system: Secondary | ICD-10-CM

## 2016-03-03 DIAGNOSIS — M25611 Stiffness of right shoulder, not elsewhere classified: Secondary | ICD-10-CM

## 2016-03-03 NOTE — Therapy (Signed)
[place], [place], [address] Phone: [phone]   Fax:  [phone]  Occupational Therapy Treatment  Patient Details  Name: Rebecca Huerta MRN: [id number] Date of Birth: 1945/07/24 Referring Provider: Dr. Arther Abbott  Encounter Date: 03/03/2016      OT End of Session - 03/03/16 1535    Visit Number 6   Number of Visits 24   Date for OT Re-Evaluation 04/21/16  mini reassess on 10/11   Authorization Type [employer]   Authorization Time Period before 10th visit   Authorization - Visit Number 6   Authorization - Number of Visits 10   OT Start Time 1301   OT Stop Time 1344   OT Time Calculation (min) 43 min   Activity Tolerance Patient tolerated treatment well   Behavior During Therapy [facility] for tasks assessed/performed      Past Medical History:  Diagnosis Date  . A-fib ([place])   . Anxiety   . CHF (congestive heart failure) ([place])   . Hypertension     Past Surgical History:  Procedure Laterality Date  . TUBAL LIGATION      There were no vitals filed for this visit.      Subjective Assessment - 03/03/16 1301    Subjective  S: It's fine until we start moving it today.    Currently in Pain? No/denies            [facility] OT Assessment - 03/03/16 1301      Assessment   Diagnosis Right Proximal Humerus Fracture     Precautions   Precautions Shoulder   Precaution Comments PROM through 9/25, progress to AA/ROM and A/ROM as tolerated                  OT Treatments/Exercises (OP) - 03/03/16 1320      Exercises   Exercises Shoulder     Shoulder Exercises: Supine   Protraction PROM;10 reps   Horizontal ABduction PROM;10 reps   External Rotation PROM;10 reps   Internal Rotation PROM;10 reps   Flexion PROM;10 reps   ABduction PROM;10 reps     Shoulder Exercises: Therapy Ball   Flexion 15 reps   ABduction 15 reps     Shoulder Exercises: ROM/Strengthening   Thumb Tacks low thumb tacks 1'   Prot/Ret//Elev/Dep low prt/ret/elev/dep 1 minute with good form with minimal verbal cueing     Shoulder Exercises: Isometric Strengthening   Flexion Supine;5X10"   Extension Supine;5X10"   External Rotation Supine;5X10"   Internal Rotation Supine;5X10"   ABduction Supine;5X10"   ADduction Supine;5X10"                  OT Short Term Goals - 02/23/16 1412      OT SHORT TERM GOAL #1   Title Patient will be educated on a HEP.   Time 4   Period Weeks   Status On-going     OT SHORT TERM GOAL #2   Title Patient will improve right shoulder P/ROM to [facility] in order to improve ability to don and doff clothing with increased ease and less pain.    Time 4   Period Weeks   Status On-going     OT SHORT TERM GOAL #3   Title Patient will improve right shoulder strength to 3+/5 for improved ability to lift pots and pans when cooking.    Time 4   Period Weeks   Status On-going  OT SHORT TERM GOAL #4   Title Patient will decrease pain in her right shoulder to 3/10 or better while she is completing ADL tasks.   Time 4   Period Weeks   Status On-going     OT SHORT TERM GOAL #5   Title Patient will decrease fascial restrictions to min-mod in her right shoulder region for greater mobility needed for ADL completion.    Time 4   Period Weeks   Status On-going           OT Long Term Goals - 02/23/16 1413      OT LONG TERM GOAL #1   Title Patient will use her right arm as dominant with all functional, work, and leisure activities.    Time 8   Period Weeks   Status On-going     OT LONG TERM GOAL #2   Title Patient will improve right shoulder A/ROM to [facility] in order to reach behind her back and over her head during ADL completion.   Time 8   Period Weeks   Status On-going     OT LONG TERM GOAL #3   Title Patient will improve right shoulder strength to 4+/5 or better in order to be able to use her right arm to lift bags of groceries.   Time  8   Period Weeks   Status On-going     OT LONG TERM GOAL #4   Title Patient will decrease pain in her right shoulder to 2/10 or better when completing ADLs.   Time 8   Period Weeks   Status On-going     OT LONG TERM GOAL #5   Title Patient will decrease fascial restrctions to trace in her right shoulder region for greater mobility needed for ADL and work activity completion.    Time 8   Period Weeks   Status On-going               Plan - 03/03/16 1535    Clinical Impression Statement A: Increased isometrics to 5X10" this session, pt able to tolerate approximately 75% P/ROM during manual stretching. Verbal cuing for speed required during ball exercises, continued thumb tacks and prot/ret/elev/dep with intermittent verbal cuing for form.    Plan P: Begin AA/ROM, continue working to increase P/ROM to greater than 75% to improve ability to use arm during functional tasks.       Patient will benefit from skilled therapeutic intervention in order to improve the following deficits and impairments:  Decreased range of motion, Decreased strength, Decreased skin integrity, Increased edema, Increased fascial restricitons, Increased muscle spasms, Pain, Impaired flexibility  Visit Diagnosis: Stiffness of right shoulder, not elsewhere classified  Other symptoms and signs involving the musculoskeletal system  Pain in right shoulder    Problem List Patient Active Problem List   Diagnosis Date Noted  . Cardiomyopathy ([place]) 06/09/2014  . Chronic systolic heart failure ([place]) 06/09/2014  . Malnutrition ([place]) 06/09/2014  . Rash 06/09/2014  . Anticoagulation adequate 06/09/2014  . Pulmonary hypertension ([place])   . Acute on chronic systolic CHF (congestive heart failure) ([place]) 05/12/2014  . Benign essential HTN 05/12/2014  . Anxiety state 05/12/2014  . Atrial fibrillation with RVR ([place]) 05/12/2014  . Postoperative pulmonary edema ([place]) 05/12/2014  . Pleural effusion 05/12/2014  .  Hypokalemia 05/12/2014  . Hyponatremia 05/12/2014  . A-fib ([place]) 05/11/2014  . Edema leg 05/11/2014  . Pulmonary edema 05/11/2014  . Tobacco abuse 05/11/2014  . Alcohol abuse 05/11/2014  Guadelupe Sabin, OTR/L  [phone] 03/03/2016, 3:38 PM  [place] [address] [place], [place], [address] Phone: [phone]   Fax:  [phone]  Name: Rebecca Huerta MRN: [id number] Date of Birth: 06-28-1945

## 2016-03-05 ENCOUNTER — Other Ambulatory Visit: Payer: Self-pay | Admitting: Cardiovascular Disease

## 2016-03-06 ENCOUNTER — Ambulatory Visit (HOSPITAL_COMMUNITY): Payer: PPO | Admitting: Specialist

## 2016-03-06 ENCOUNTER — Telehealth: Payer: Self-pay | Admitting: Cardiovascular Disease

## 2016-03-06 DIAGNOSIS — M25611 Stiffness of right shoulder, not elsewhere classified: Secondary | ICD-10-CM

## 2016-03-06 DIAGNOSIS — R29898 Other symptoms and signs involving the musculoskeletal system: Secondary | ICD-10-CM

## 2016-03-06 DIAGNOSIS — M25511 Pain in right shoulder: Secondary | ICD-10-CM

## 2016-03-06 MED ORDER — BUSPIRONE HCL 5 MG PO TABS: 5.0000 mg | ORAL_TABLET | Freq: Three times a day (TID) | ORAL | 1 refills | Status: DC

## 2016-03-06 NOTE — Telephone Encounter (Signed)
[facility]g

## 2016-03-06 NOTE — Therapy (Signed)
[facility] [address] [place], [place], [address] Phone: [phone]   Fax:  [phone]  Occupational Therapy Treatment  Patient Details  Name: Rebecca Huerta MRN: [id number] Date of Birth: 1945-11-22 Referring Provider: Dr. Fuller Canada  Encounter Date: 03/06/2016      OT End of Session - 03/06/16 1419    Visit Number 7   Number of Visits 24   Date for OT Re-Evaluation 04/21/16  mini reassess on 03/22/16   Authorization Type [employer]   Authorization Time Period before 10th visit   Authorization - Visit Number 7   Authorization - Number of Visits 10   OT Start Time 1309   OT Stop Time 1347   OT Time Calculation (min) 38 min   Activity Tolerance Patient limited by fatigue   Behavior During Therapy [facility] for tasks assessed/performed      Past Medical History:  Diagnosis Date  . A-fib ([facility])   . Anxiety   . CHF (congestive heart failure) ([facility])   . Hypertension     Past Surgical History:  Procedure Laterality Date  . TUBAL LIGATION      There were no vitals filed for this visit.      Subjective Assessment - 03/06/16 1309    Subjective  S:  It feels ok.   Currently in Pain? No/denies   Pain Score 0-No pain            [facility] OT Assessment - 03/06/16 0001      Assessment   Diagnosis Right Proximal Humerus Fracture     Precautions   Precautions Shoulder   Precaution Comments PROM through 9/25, progress to AA/ROM and A/ROM as tolerated                  OT Treatments/Exercises (OP) - 03/06/16 0001      Exercises   Exercises Shoulder     Shoulder Exercises: Supine   Protraction PROM;AAROM;10 reps   Horizontal ABduction PROM;AAROM;10 reps   External Rotation PROM;AAROM;10 reps   Internal Rotation PROM;AAROM;10 reps   Flexion PROM;AAROM;10 reps   ABduction PROM;AAROM;10 reps     Shoulder Exercises: Seated   Elevation AROM;15 reps   Extension AROM;15 reps   Row AROM;15  reps     Shoulder Exercises: Pulleys   Flexion 1 minute   ABduction 1 minute   ABduction Limitations with min facilitation from therapist for entire minute for technique      Manual Therapy   Manual Therapy Myofascial release   Manual therapy comments manual therapy completed seperately from all other interventions and assessments this date   Myofascial Release myofascial release and manual stretching to right upper arm, scapular, shoulder region and associated areas to decrease fascial restrictions and improve pain free mobility in right shoulder and arm                 OT Education - 03/06/16 1418    Education provided Yes   Education Details educated on AA/ROM in supine with dowel rod.  Patient able to demonstrate exercises back to therapist with good form.    Person(s) Educated Patient   Methods Explanation;Handout;Verbal cues   Comprehension Verbalized understanding;Returned demonstration          OT Short Term Goals - 02/23/16 1412      OT SHORT TERM GOAL #1   Title Patient will be educated on a HEP.   Time 4   Period Weeks   Status  On-going     OT SHORT TERM GOAL #2   Title Patient will improve right shoulder P/[facility] to [facility] in order to improve ability to don and doff clothing with increased ease and less pain.    Time 4   Period Weeks   Status On-going     OT SHORT TERM GOAL #3   Title Patient will improve right shoulder strength to 3+/5 for improved ability to lift pots and pans when cooking.    Time 4   Period Weeks   Status On-going     OT SHORT TERM GOAL #4   Title Patient will decrease pain in her right shoulder to 3/10 or better while she is completing ADL tasks.   Time 4   Period Weeks   Status On-going     OT SHORT TERM GOAL #5   Title Patient will decrease fascial restrictions to min-mod in her right shoulder region for greater mobility needed for ADL completion.    Time 4   Period Weeks   Status On-going           OT Long Term Goals  - 02/23/16 1413      OT LONG TERM GOAL #1   Title Patient will use her right arm as dominant with all functional, work, and leisure activities.    Time 8   Period Weeks   Status On-going     OT LONG TERM GOAL #2   Title Patient will improve right shoulder [facility] to [facility] in order to reach behind her back and over her head during ADL completion.   Time 8   Period Weeks   Status On-going     OT LONG TERM GOAL #3   Title Patient will improve right shoulder strength to 4+/5 or better in order to be able to use her right arm to lift bags of groceries.   Time 8   Period Weeks   Status On-going     OT LONG TERM GOAL #4   Title Patient will decrease pain in her right shoulder to 2/10 or better when completing ADLs.   Time 8   Period Weeks   Status On-going     OT LONG TERM GOAL #5   Title Patient will decrease fascial restrctions to trace in her right shoulder region for greater mobility needed for ADL and work activity completion.    Time 8   Period Weeks   Status On-going               Plan - 03/06/16 1430    Clinical Impression Statement A:  patient continues to improve P/ROM.  transitioned to AA/ROM in supine this date with good form and range to approximately 70%   Plan P:  Follow up on HEP for AA/ROM in supine.  continue to progress P[facility]ROM to [facility] for functional activities.       Patient will benefit from skilled therapeutic intervention in order to improve the following deficits and impairments:  Decreased range of motion, Decreased strength, Decreased skin integrity, Increased edema, Increased fascial restricitons, Increased muscle spasms, Pain, Impaired flexibility  Visit Diagnosis: Stiffness of right shoulder, not elsewhere classified  Other symptoms and signs involving the musculoskeletal system  Pain in right shoulder    Problem List Patient Active Problem List   Diagnosis Date Noted  . Cardiomyopathy (HCC) 06/09/2014  . Chronic systolic heart failure  (HCC) 16/10/960412/29/2015  . Malnutrition (HCC) 06/09/2014  . Rash 06/09/2014  . Anticoagulation adequate 06/09/2014  .  Pulmonary hypertension (HCC)   . Acute on chronic systolic CHF (congestive heart failure) (HCC) 05/12/2014  . Benign essential HTN 05/12/2014  . Anxiety state 05/12/2014  . Atrial fibrillation with RVR (HCC) 05/12/2014  . Postoperative pulmonary edema (HCC) 05/12/2014  . Pleural effusion 05/12/2014  . Hypokalemia 05/12/2014  . Hyponatremia 05/12/2014  . A-fib (HCC) 05/11/2014  . Edema leg 05/11/2014  . Pulmonary edema 05/11/2014  . Tobacco abuse 05/11/2014  . Alcohol abuse 05/11/2014    Shirlean Mylar, [place], OTR/L [phone]  03/06/2016, 2:33 PM  [facility] [address] [place], [place], [address] Phone: [phone]   Fax:  [phone]  Name: Rebecca Huerta MRN: [id number] Date of Birth: 09-30-1945

## 2016-03-06 NOTE — Patient Instructions (Signed)
Perform each exercise ____10____ reps. 2-3x days.   Protraction - STANDING  Start by holding a wand or cane at chest height.  Next, slowly push the wand outwards in front of your body so that your elbows become fully straightened. Then, return to the original position.     Shoulder FLEXION - STANDING - PALMS UP  In the standing position, hold a wand/cane with both arms, palms up on both sides. Raise up the wand/cane allowing your unaffected arm to perform most of the effort. Your affected arm should be partially relaxed.      Internal/External ROTATION - STANDING  In the standing position, hold a wand/cane with both hands keeping your elbows bent. Move your arms and wand/cane to one side.  Your affected arm should be partially relaxed while your unaffected arm performs most of the effort.       Shoulder ABDUCTION - STANDING  While holding a wand/cane palm face up on the injured side and palm face down on the uninjured side, slowly raise up your injured arm to the side.                     Horizontal Abduction/Adduction      Straight arms holding cane at shoulder height, bring cane to right, center, left. Repeat starting to left.   Copyright  VHI. All rights reserved.       

## 2016-03-08 ENCOUNTER — Encounter (HOSPITAL_COMMUNITY): Payer: Self-pay | Admitting: Occupational Therapy

## 2016-03-08 ENCOUNTER — Ambulatory Visit (HOSPITAL_COMMUNITY): Payer: PPO | Admitting: Occupational Therapy

## 2016-03-08 DIAGNOSIS — M25611 Stiffness of right shoulder, not elsewhere classified: Secondary | ICD-10-CM

## 2016-03-08 DIAGNOSIS — M25511 Pain in right shoulder: Secondary | ICD-10-CM | POA: Diagnosis not present

## 2016-03-08 DIAGNOSIS — R29898 Other symptoms and signs involving the musculoskeletal system: Secondary | ICD-10-CM

## 2016-03-08 NOTE — Therapy (Signed)
[facility] [address] [place], [place], [address] Phone: [phone]   Fax:  [phone]  Occupational Therapy Treatment  Patient Details  Name: Rebecca FlirtLinda Huerta MRN: [id number] Date of Birth: 02/21/1946 Referring Provider: Dr. Fuller CanadaStanley Harrison  Encounter Date: 03/08/2016      OT End of Session - 03/08/16 1352    Visit Number 8   Number of Visits 24   Date for OT Re-Evaluation 04/21/16  mini reassess on 03/22/16   Authorization Type [employer]   Authorization Time Period before 10th visit   Authorization - Visit Number 8   Authorization - Number of Visits 10   OT Start Time 1300   OT Stop Time 1347   OT Time Calculation (min) 47 min   Activity Tolerance Patient limited by fatigue   Behavior During Therapy [facility] for tasks assessed/performed      Past Medical History:  Diagnosis Date  . A-fib ([facility])   . Anxiety   . CHF (congestive heart failure) (H[facility])   . Hypertension     Past Surgical History:  Procedure Laterality Date  . TUBAL LIGATION      There were no vitals filed for this visit.      Subjective Assessment - 03/08/16 1258    Subjective  S: Oh it was really sore after last time.    Currently in Pain? Yes   Pain Score 7    Pain Location Shoulder   Pain Orientation Right   Pain Descriptors / Indicators Aching   Pain Type Acute pain   Pain Radiating Towards n/a   Pain Onset 1 to 4 weeks ago   Pain Frequency Intermittent   Aggravating Factors  movement   Pain Relieving Factors rest, heat, icy hot   Effect of Pain on Daily Activities unable to use RUE as dominant with daily tasks   Multiple Pain Sites No            [facility] OT Assessment - 03/08/16 1258      Assessment   Diagnosis Right Proximal Humerus Fracture     Precautions   Precautions Shoulder   Precaution Comments PROM through 9/25, progress to AA/ROM and A/ROM as tolerated                  OT Treatments/Exercises  (OP) - 03/08/16 1302      Exercises   Exercises Shoulder     Shoulder Exercises: Supine   Protraction PROM;AAROM;10 reps   Horizontal ABduction PROM;AAROM;10 reps   External Rotation PROM;AAROM;10 reps   Internal Rotation PROM;AAROM;10 reps   Flexion PROM;AAROM;10 reps   ABduction PROM;AAROM;10 reps     Shoulder Exercises: Seated   Protraction AAROM;10 reps   Horizontal ABduction AAROM;10 reps   External Rotation AAROM;10 reps   Internal Rotation AAROM;10 reps   Flexion AAROM;10 reps   Abduction AAROM;10 reps     Shoulder Exercises: Pulleys   Flexion 1 minute   ABduction Limitations unable to complete today due to catching sensation causing pain     Shoulder Exercises: ROM/Strengthening   Wall Wash low wall wash 1'   Thumb Tacks 1'     Manual Therapy   Manual Therapy Myofascial release   Manual therapy comments manual therapy completed seperately from all other interventions and assessments this date   Myofascial Release myofascial release and manual stretching to right upper arm, scapular, shoulder region and associated areas to decrease fascial restrictions and improve pain free mobility in right  shoulder and arm                   OT Short Term Goals - 02/23/16 1412      OT SHORT TERM GOAL #1   Title Patient will be educated on a HEP.   Time 4   Period Weeks   Status On-going     OT SHORT TERM GOAL #2   Title Patient will improve right shoulder P/ROM to [facility] in order to improve ability to don and doff clothing with increased ease and less pain.    Time 4   Period Weeks   Status On-going     OT SHORT TERM GOAL #3   Title Patient will improve right shoulder strength to 3+/5 for improved ability to lift pots and pans when cooking.    Time 4   Period Weeks   Status On-going     OT SHORT TERM GOAL #4   Title Patient will decrease pain in her right shoulder to 3/10 or better while she is completing ADL tasks.   Time 4   Period Weeks   Status On-going      OT SHORT TERM GOAL #5   Title Patient will decrease fascial restrictions to min-mod in her right shoulder region for greater mobility needed for ADL completion.    Time 4   Period Weeks   Status On-going           OT Long Term Goals - 02/23/16 1413      OT LONG TERM GOAL #1   Title Patient will use her right arm as dominant with all functional, work, and leisure activities.    Time 8   Period Weeks   Status On-going     OT LONG TERM GOAL #2   Title Patient will improve right shoulder A/ROM to [facility] in order to reach behind her back and over her head during ADL completion.   Time 8   Period Weeks   Status On-going     OT LONG TERM GOAL #3   Title Patient will improve right shoulder strength to 4+/5 or better in order to be able to use her right arm to lift bags of groceries.   Time 8   Period Weeks   Status On-going     OT LONG TERM GOAL #4   Title Patient will decrease pain in her right shoulder to 2/10 or better when completing ADLs.   Time 8   Period Weeks   Status On-going     OT LONG TERM GOAL #5   Title Patient will decrease fascial restrctions to trace in her right shoulder region for greater mobility needed for ADL and work activity completion.    Time 8   Period Weeks   Status On-going               Plan - 03/08/16 1352    Clinical Impression Statement A: Added AA/ROM in sitting, continued supine AA/ROM. Pt requires verbal cuing for correct form, pt able to achieve approximately 70-75% range today. Pt unable to complete pulleys in abduction due to catching sensation in posterior deltoid region. Pt reports HEP is going well.    Plan P: Update G-Code. Continue working to improve P/ROM to [facility]. Continue improving independence during AA/ROM, attempt to resume abduction pulleys   OT Home Exercise Plan 9/25: AA/ROM      Patient will benefit from skilled therapeutic intervention in order to improve the following deficits and impairments:  Decreased range  of motion, Decreased strength, Decreased skin integrity, Increased edema, Increased fascial restricitons, Increased muscle spasms, Pain, Impaired flexibility  Visit Diagnosis: Stiffness of right shoulder, not elsewhere classified  Other symptoms and signs involving the musculoskeletal system  Pain in right shoulder    Problem List Patient Active Problem List   Diagnosis Date Noted  . Cardiomyopathy (HCC) 06/09/2014  . Chronic systolic heart failure (HCC) 06/09/2014  . Malnutrition (HCC) 06/09/2014  . Rash 06/09/2014  . Anticoagulation adequate 06/09/2014  . Pulmonary hypertension (HCC)   . Acute on chronic systolic CHF (congestive heart failure) (HCC) 05/12/2014  . Benign essential HTN 05/12/2014  . Anxiety state 05/12/2014  . Atrial fibrillation with RVR (HCC) 05/12/2014  . Postoperative pulmonary edema (HCC) 05/12/2014  . Pleural effusion 05/12/2014  . Hypokalemia 05/12/2014  . Hyponatremia 05/12/2014  . A-fib (HCC) 05/11/2014  . Edema leg 05/11/2014  . Pulmonary edema 05/11/2014  . Tobacco abuse 05/11/2014  . Alcohol abuse 05/11/2014   Ezra Sites, OTR/L  [phone] 03/08/2016, 1:55 PM  [facility] [address] [place], [place], [address] Phone: [phone]   Fax:  [phone]  Name: Rebecca Huerta MRN: [id number] Date of Birth: May 15, 1946

## 2016-03-10 ENCOUNTER — Ambulatory Visit (HOSPITAL_COMMUNITY): Payer: PPO | Admitting: Occupational Therapy

## 2016-03-10 ENCOUNTER — Encounter (HOSPITAL_COMMUNITY): Payer: Self-pay | Admitting: Occupational Therapy

## 2016-03-10 DIAGNOSIS — M25511 Pain in right shoulder: Secondary | ICD-10-CM

## 2016-03-10 DIAGNOSIS — R29898 Other symptoms and signs involving the musculoskeletal system: Secondary | ICD-10-CM

## 2016-03-10 DIAGNOSIS — M25611 Stiffness of right shoulder, not elsewhere classified: Secondary | ICD-10-CM
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
## 2016-03-13 ENCOUNTER — Other Ambulatory Visit: Payer: Self-pay | Admitting: Cardiovascular Disease

## 2016-03-13 NOTE — Telephone Encounter (Signed)
°*  STAT* If patient is at the pharmacy, call can be transferred to refill team.   1. Which medications need to be refilled? (please list name of each medication and dose if known)   Buspirone  2. Which pharmacy/location (including street and city if local pharmacy) is medication to be sent to?   [facility]  3. Do they need a 30 day or 90 day supply?

## 2016-03-13 NOTE — Telephone Encounter (Signed)
Pt referred to call pcp for anxiety med per Dr Alroy DustKoneswaran,pharmacy made aware to remove dr Junius ArgyleKoneswaran's name

## 2016-03-14 ENCOUNTER — Ambulatory Visit (HOSPITAL_COMMUNITY): Payer: PPO | Attending: Orthopedic Surgery

## 2016-03-14 DIAGNOSIS — R29898 Other symptoms and signs involving the musculoskeletal system: Secondary | ICD-10-CM | POA: Diagnosis not present

## 2016-03-14 DIAGNOSIS — M25611 Stiffness of right shoulder, not elsewhere classified: Secondary | ICD-10-CM

## 2016-03-14 NOTE — Therapy (Signed)
[facility] [address] [place], [place], [address] Phone: [phone]   Fax:  [phone]  Occupational Therapy Treatment  Patient Details  Name: Rebecca Huerta MRN: [id number] Date of Birth: Oct 14, 1945 Referring Provider: Dr. Fuller Canada  Encounter Date: 03/14/2016      OT End of Session - 03/14/16 1414    Visit Number 10   Number of Visits 24   Date for OT Re-Evaluation 04/21/16  mini reassess on 03/22/16   Authorization Type [employer]   Authorization Time Period before 19th visit   Authorization - Visit Number 10   Authorization - Number of Visits 19   OT Start Time 1300   OT Stop Time 1345   OT Time Calculation (min) 45 min   Activity Tolerance Patient limited by fatigue;Patient tolerated treatment well   Behavior During Therapy [facility] for tasks assessed/performed      Past Medical History:  Diagnosis Date  . A-fib ([facility])   . Anxiety   . CHF (congestive heart failure) ([facility])   . Hypertension     Past Surgical History:  Procedure Laterality Date  . TUBAL LIGATION      There were no vitals filed for this visit.      Subjective Assessment - 03/14/16 1316    Subjective  S: I probably am working it too hard at home.    Currently in Pain? No/denies            [facility] OT Assessment - 03/14/16 1316      Assessment   Diagnosis Right Proximal Humerus Fracture     Precautions   Precautions Shoulder   Precaution Comments PROM through 9/25, progress to AA/ROM and A/ROM as tolerated                  OT Treatments/Exercises (OP) - 03/14/16 1316      Exercises   Exercises Shoulder     Shoulder Exercises: Supine   Protraction PROM;5 reps;AAROM;12 reps   Horizontal ABduction PROM;5 reps;AAROM;12 reps   External Rotation PROM;5 reps;AAROM;12 reps   Internal Rotation PROM;5 reps;AAROM;12 reps   Flexion PROM;5 reps;AAROM;12 reps   ABduction PROM;5 reps;AAROM;12 reps     Shoulder  Exercises: Seated   Protraction AAROM;12 reps   Horizontal ABduction AAROM;12 reps   External Rotation AAROM;12 reps   Internal Rotation AAROM;12 reps   Flexion AAROM;12 reps   Abduction AAROM;12 reps     Shoulder Exercises: Pulleys   Flexion 1 minute   ABduction 1 minute     Shoulder Exercises: ROM/Strengthening   Wall Wash 1'   Proximal Shoulder Strengthening, Supine 10X each no rest breaks     Manual Therapy   Manual Therapy Myofascial release   Manual therapy comments manual therapy completed seperately from all other interventions and assessments this date   Myofascial Release myofascial release and manual stretching to right upper arm, scapular, shoulder region and associated areas to decrease fascial restrictions and improve pain free mobility in right shoulder and arm                   OT Short Term Goals - 02/23/16 1412      OT SHORT TERM GOAL #1   Title Patient will be educated on a HEP.   Time 4   Period Weeks   Status On-going     OT SHORT TERM GOAL #2   Title Patient will improve right shoulder P/ROM to [facility] in order to  improve ability to don and doff clothing with increased ease and less pain.    Time 4   Period Weeks   Status On-going     OT SHORT TERM GOAL #3   Title Patient will improve right shoulder strength to 3+/5 for improved ability to lift pots and pans when cooking.    Time 4   Period Weeks   Status On-going     OT SHORT TERM GOAL #4   Title Patient will decrease pain in her right shoulder to 3/10 or better while she is completing ADL tasks.   Time 4   Period Weeks   Status On-going     OT SHORT TERM GOAL #5   Title Patient will decrease fascial restrictions to min-mod in her right shoulder region for greater mobility needed for ADL completion.    Time 4   Period Weeks   Status On-going           OT Long Term Goals - 02/23/16 1413      OT LONG TERM GOAL #1   Title Patient will use her right arm as dominant with all  functional, work, and leisure activities.    Time 8   Period Weeks   Status On-going     OT LONG TERM GOAL #2   Title Patient will improve right shoulder A/ROM to [facility] in order to reach behind her back and over her head during ADL completion.   Time 8   Period Weeks   Status On-going     OT LONG TERM GOAL #3   Title Patient will improve right shoulder strength to 4+/5 or better in order to be able to use her right arm to lift bags of groceries.   Time 8   Period Weeks   Status On-going     OT LONG TERM GOAL #4   Title Patient will decrease pain in her right shoulder to 2/10 or better when completing ADLs.   Time 8   Period Weeks   Status On-going     OT LONG TERM GOAL #5   Title Patient will decrease fascial restrctions to trace in her right shoulder region for greater mobility needed for ADL and work activity completion.    Time 8   Period Weeks   Status On-going               Plan - 03/14/16 1414    Clinical Impression Statement A: Completed all AA/ROM exercises, pulleys, and wall wash. patient completing HEP at home although she states that she pushing herself to the point of pain. Re-education given regarding what restrictions/limitation patient should follow.    Plan P: Continue to work on       Patient will benefit from skilled therapeutic intervention in order to improve the following deficits and impairments:  Decreased range of motion, Decreased strength, Decreased skin integrity, Increased edema, Increased fascial restricitons, Increased muscle spasms, Pain, Impaired flexibility  Visit Diagnosis: Stiffness of right shoulder, not elsewhere classified  Other symptoms and signs involving the musculoskeletal system    Problem List Patient Active Problem List   Diagnosis Date Noted  . Cardiomyopathy ([facility]) 06/09/2014  . Chronic systolic heart failure ([facility]) 06/09/2014  . Malnutrition ([facility]) 06/09/2014  . Rash 06/09/2014  . Anticoagulation adequate  06/09/2014  . Pulmonary hypertension   . Acute on chronic systolic CHF (congestive heart failure) (H[facility]) 05/12/2014  . Benign essential HTN 05/12/2014  . Anxiety state 05/12/2014  . Atrial fibrillation with RVR (  HCC) 05/12/2014  . Postoperative pulmonary edema ([facility]) 05/12/2014  . Pleural effusion 05/12/2014  . Hypokalemia 05/12/2014  . Hyponatremia 05/12/2014  . A-fib ([facility]) 05/11/2014  . Edema leg 05/11/2014  . Pulmonary edema 05/11/2014  . Tobacco abuse 05/11/2014  . Alcohol abuse 05/11/2014   Limmie PatriciaLaura Essenmacher, OTR/L,CBIS  [phone]  03/14/2016, 3:19 PM  [facility] [address] [place], [place], [address] Phone: [phone]   Fax:  [phone]  Name: Haskell FlirtLinda Dao MRN: [id number] Date of Birth: 08/19/1945

## 2016-03-15 ENCOUNTER — Ambulatory Visit (HOSPITAL_COMMUNITY): Payer: PPO | Admitting: Occupational Therapy

## 2016-03-15 ENCOUNTER — Encounter (HOSPITAL_COMMUNITY): Payer: Self-pay | Admitting: Occupational Therapy

## 2016-03-15 DIAGNOSIS — R29898 Other symptoms and signs involving the musculoskeletal system: Secondary | ICD-10-CM

## 2016-03-15 DIAGNOSIS — M25611 Stiffness of right shoulder, not elsewhere classified: Secondary | ICD-10-CM | POA: Diagnosis not present

## 2016-03-15 NOTE — Therapy (Signed)
[place], [place], [address] Phone: [phone]   Fax:  [phone]  Occupational Therapy Treatment  Patient Details  Name: Rebecca Huerta MRN: [id number] Date of Birth: Nov 04, 1945 Referring Provider: Dr. Arther Abbott  Encounter Date: 03/15/2016      OT End of Session - 03/15/16 1442    Visit Number 11   Number of Visits 24   Date for OT Re-Evaluation 04/21/16  mini reassess on 03/22/16   Authorization Type [employer]   Authorization Time Period before 19th visit   Authorization - Visit Number 11   Authorization - Number of Visits 19   OT Start Time 1300   OT Stop Time 1345   OT Time Calculation (min) 45 min   Activity Tolerance Patient limited by fatigue;Patient tolerated treatment well   Behavior During Therapy [facility] for tasks assessed/performed      Past Medical History:  Diagnosis Date  . A-fib ([place])   . Anxiety   . CHF (congestive heart failure) ([place])   . Hypertension     Past Surgical History:  Procedure Laterality Date  . TUBAL LIGATION      There were no vitals filed for this visit.      Subjective Assessment - 03/15/16 1300    Subjective  S:    Currently in Pain? No/denies            [facility] OT Assessment - 03/15/16 1259      Assessment   Diagnosis Right Proximal Humerus Fracture     Precautions   Precautions Shoulder   Precaution Comments PROM through 9/25, progress to AA/ROM and A/ROM as tolerated                  OT Treatments/Exercises (OP) - 03/15/16 1302      Exercises   Exercises Shoulder     Shoulder Exercises: Supine   Protraction PROM;5 reps;AAROM;12 reps   Horizontal ABduction PROM;5 reps;AAROM;12 reps   External Rotation PROM;5 reps;AAROM;12 reps   Internal Rotation PROM;5 reps;AAROM;12 reps   Flexion PROM;5 reps;AAROM;12 reps   ABduction PROM;5 reps;AAROM;12 reps     Shoulder Exercises: Standing   Protraction AAROM;12  reps   Horizontal ABduction AAROM;12 reps   External Rotation AAROM;12 reps   Internal Rotation AAROM;12 reps   Flexion AAROM;12 reps   ABduction AAROM;12 reps   Extension Theraband;10 reps   Theraband Level (Shoulder Extension) Level 2 (Red)   Row Theraband;10 reps   Theraband Level (Shoulder Row) Level 2 (Red)     Shoulder Exercises: ROM/Strengthening   Proximal Shoulder Strengthening, Supine 10X each no rest breaks   Prot/Ret//Elev/Dep 1'     Manual Therapy   Manual Therapy Myofascial release   Manual therapy comments manual therapy completed seperately from all other interventions and assessments this date   Myofascial Release myofascial release and manual stretching to right upper arm, scapular, shoulder region and associated areas to decrease fascial restrictions and improve pain free mobility in right shoulder and arm                   OT Short Term Goals - 02/23/16 1412      OT SHORT TERM GOAL #1   Title Patient will be educated on a HEP.   Time 4   Period Weeks   Status On-going     OT SHORT TERM GOAL #2   Title Patient will improve right shoulder P/ROM to [facility] in order  to improve ability to don and doff clothing with increased ease and less pain.    Time 4   Period Weeks   Status On-going     OT SHORT TERM GOAL #3   Title Patient will improve right shoulder strength to 3+/5 for improved ability to lift pots and pans when cooking.    Time 4   Period Weeks   Status On-going     OT SHORT TERM GOAL #4   Title Patient will decrease pain in her right shoulder to 3/10 or better while she is completing ADL tasks.   Time 4   Period Weeks   Status On-going     OT SHORT TERM GOAL #5   Title Patient will decrease fascial restrictions to min-mod in her right shoulder region for greater mobility needed for ADL completion.    Time 4   Period Weeks   Status On-going           OT Long Term Goals - 02/23/16 1413      OT LONG TERM GOAL #1   Title Patient  will use her right arm as dominant with all functional, work, and leisure activities.    Time 8   Period Weeks   Status On-going     OT LONG TERM GOAL #2   Title Patient will improve right shoulder A/ROM to [facility] in order to reach behind her back and over her head during ADL completion.   Time 8   Period Weeks   Status On-going     OT LONG TERM GOAL #3   Title Patient will improve right shoulder strength to 4+/5 or better in order to be able to use her right arm to lift bags of groceries.   Time 8   Period Weeks   Status On-going     OT LONG TERM GOAL #4   Title Patient will decrease pain in her right shoulder to 2/10 or better when completing ADLs.   Time 8   Period Weeks   Status On-going     OT LONG TERM GOAL #5   Title Patient will decrease fascial restrctions to trace in her right shoulder region for greater mobility needed for ADL and work activity completion.    Time 8   Period Weeks   Status On-going               Plan - 03/15/16 1442    Clinical Impression Statement A: Added scapular theraband extension and row this session, pt required verbal cuing for form and technique. Pt completed AA/ROM in standing, achieving ROM WFL, cuing for form and end range stretch required.    Plan P: Continue working on improving P/ROM and AA/ROM to increase RUE use during functional task completion.    OT Home Exercise Plan 9/25: AA/ROM      Patient will benefit from skilled therapeutic intervention in order to improve the following deficits and impairments:  Decreased range of motion, Decreased strength, Decreased skin integrity, Increased edema, Increased fascial restricitons, Increased muscle spasms, Pain, Impaired flexibility  Visit Diagnosis: Stiffness of right shoulder, not elsewhere classified  Other symptoms and signs involving the musculoskeletal system    Problem List Patient Active Problem List   Diagnosis Date Noted  . Cardiomyopathy ([place]) 06/09/2014  .  Chronic systolic heart failure ([place]) 06/09/2014  . Malnutrition ([place]) 06/09/2014  . Rash 06/09/2014  . Anticoagulation adequate 06/09/2014  . Pulmonary hypertension   . Acute on chronic systolic CHF (congestive heart failure) (  [place]) 05/12/2014  . Benign essential HTN 05/12/2014  . Anxiety state 05/12/2014  . Atrial fibrillation with RVR ([place]) 05/12/2014  . Postoperative pulmonary edema ([place]) 05/12/2014  . Pleural effusion 05/12/2014  . Hypokalemia 05/12/2014  . Hyponatremia 05/12/2014  . A-fib ([place]) 05/11/2014  . Edema leg 05/11/2014  . Pulmonary edema 05/11/2014  . Tobacco abuse 05/11/2014  . Alcohol abuse 05/11/2014   Guadelupe Sabin, OTR/L  [phone] 03/15/2016, 2:57 PM  [place] [address] [place], [place], [address] Phone: [phone]   Fax:  [phone]  Name: Rebecca Huerta MRN: [id number] Date of Birth: 08-05-1945

## 2016-03-17 ENCOUNTER — Encounter (HOSPITAL_COMMUNITY): Payer: Self-pay | Admitting: Occupational Therapy

## 2016-03-17 ENCOUNTER — Ambulatory Visit (HOSPITAL_COMMUNITY): Payer: PPO | Admitting: Occupational Therapy

## 2016-03-17 DIAGNOSIS — M25611 Stiffness of right shoulder, not elsewhere classified: Secondary | ICD-10-CM

## 2016-03-17 DIAGNOSIS — R29898 Other symptoms and signs involving the musculoskeletal system: Secondary | ICD-10-CM

## 2016-03-17 NOTE — Therapy (Signed)
[facility] [address] [place], [place], [address] Phone: [phone]   Fax:  [phone]  Occupational Therapy Treatment  Patient Details  Name: Rebecca Huerta MRN: [id number] Date of Birth: 04/26/1946 Referring Provider: Dr. Fuller Canada  Encounter Date: 03/17/2016      OT End of Session - 03/17/16 1350    Visit Number 12   Number of Visits 24   Date for OT Re-Evaluation 04/21/16  mini reassess on 03/22/16   Authorization Type [employer]   Authorization Time Period before 19th visit   Authorization - Visit Number 12   Authorization - Number of Visits 19   OT Start Time 1300   OT Stop Time 1348   OT Time Calculation (min) 48 min   Activity Tolerance Patient limited by fatigue;Patient tolerated treatment well   Behavior During Therapy [facility] for tasks assessed/performed      Past Medical History:  Diagnosis Date  . A-fib (HCC)   . Anxiety   . CHF (congestive heart failure) (HCC)   . Hypertension     Past Surgical History:  Procedure Laterality Date  . TUBAL LIGATION      There were no vitals filed for this visit.      Subjective Assessment - 03/17/16 1301    Subjective  S: I've been doing these exercises at home.    Currently in Pain? No/denies            [facility] OT Assessment - 03/17/16 1300      Assessment   Diagnosis Right Proximal Humerus Fracture     Precautions   Precautions Shoulder   Precaution Comments PROM through 9/25, progress to AA/ROM and A/ROM as tolerated                  OT Treatments/Exercises (OP) - 03/17/16 1303      Exercises   Exercises Shoulder     Shoulder Exercises: Supine   Protraction PROM;5 reps;AAROM;15 reps   Horizontal ABduction PROM;5 reps;AAROM;15 reps   External Rotation PROM;5 reps;AAROM;15 reps   Internal Rotation PROM;5 reps;AAROM;15 reps   Flexion PROM;5 reps;AAROM;15 reps   ABduction PROM;5 reps;AAROM;15 reps     Shoulder  Exercises: Standing   Protraction AAROM;12 reps   Horizontal ABduction AAROM;12 reps   External Rotation AAROM;12 reps   Internal Rotation AAROM;12 reps   Flexion AAROM;12 reps   ABduction AAROM;12 reps   Extension Theraband;10 reps   Theraband Level (Shoulder Extension) Level 2 (Red)   Row Theraband;10 reps   Theraband Level (Shoulder Row) Level 2 (Red)   Other Standing Exercises PVC pipe slide 10X     Shoulder Exercises: ROM/Strengthening   Proximal Shoulder Strengthening, Supine 10X each no rest breaks   Proximal Shoulder Strengthening, Seated 10X each no rest breaks     Manual Therapy   Manual Therapy Myofascial release   Manual therapy comments manual therapy completed seperately from all other interventions and assessments this date   Myofascial Release myofascial release and manual stretching to right upper arm, scapular, shoulder region and associated areas to decrease fascial restrictions and improve pain free mobility in right shoulder and arm                   OT Short Term Goals - 02/23/16 1412      OT SHORT TERM GOAL #1   Title Patient will be educated on a HEP.   Time 4   Period Weeks   Status  On-going     OT SHORT TERM GOAL #2   Title Patient will improve right shoulder P/ROM to [facility] in order to improve ability to don and doff clothing with increased ease and less pain.    Time 4   Period Weeks   Status On-going     OT SHORT TERM GOAL #3   Title Patient will improve right shoulder strength to 3+/5 for improved ability to lift pots and pans when cooking.    Time 4   Period Weeks   Status On-going     OT SHORT TERM GOAL #4   Title Patient will decrease pain in her right shoulder to 3/10 or better while she is completing ADL tasks.   Time 4   Period Weeks   Status On-going     OT SHORT TERM GOAL #5   Title Patient will decrease fascial restrictions to min-mod in her right shoulder region for greater mobility needed for ADL completion.    Time 4    Period Weeks   Status On-going           OT Long Term Goals - 02/23/16 1413      OT LONG TERM GOAL #1   Title Patient will use her right arm as dominant with all functional, work, and leisure activities.    Time 8   Period Weeks   Status On-going     OT LONG TERM GOAL #2   Title Patient will improve right shoulder A/ROM to [facility] in order to reach behind her back and over her head during ADL completion.   Time 8   Period Weeks   Status On-going     OT LONG TERM GOAL #3   Title Patient will improve right shoulder strength to 4+/5 or better in order to be able to use her right arm to lift bags of groceries.   Time 8   Period Weeks   Status On-going     OT LONG TERM GOAL #4   Title Patient will decrease pain in her right shoulder to 2/10 or better when completing ADLs.   Time 8   Period Weeks   Status On-going     OT LONG TERM GOAL #5   Title Patient will decrease fascial restrctions to trace in her right shoulder region for greater mobility needed for ADL and work activity completion.    Time 8   Period Weeks   Status On-going               Plan - 03/17/16 1351    Clinical Impression Statement A: Added PVC pipe slide, proximal shoulder strengthening in standing, and increased supine AA/ROM repetitions to 15. Pt required verbal cuing for form and technique, improved form from previous session.    Plan P: Mini-reassessment & make additional appointments. Add scapular theraband retraction and provide theraband HEP, increase AA/ROM to 15 in standing.    OT Home Exercise Plan 9/25: AA/ROM      Patient will benefit from skilled therapeutic intervention in order to improve the following deficits and impairments:  Decreased range of motion, Decreased strength, Decreased skin integrity, Increased edema, Increased fascial restricitons, Increased muscle spasms, Pain, Impaired flexibility  Visit Diagnosis: Stiffness of right shoulder, not elsewhere classified  Other  symptoms and signs involving the musculoskeletal system    Problem List Patient Active Problem List   Diagnosis Date Noted  . Cardiomyopathy (HCC) 06/09/2014  . Chronic systolic heart failure (HCC) 06/09/2014  . Malnutrition (HCC) 06/09/2014  .  Rash 06/09/2014  . Anticoagulation adequate 06/09/2014  . Pulmonary hypertension   . Acute on chronic systolic CHF (congestive heart failure) (HCC) 05/12/2014  . Benign essential HTN 05/12/2014  . Anxiety state 05/12/2014  . Atrial fibrillation with RVR (HCC) 05/12/2014  . Postoperative pulmonary edema (HCC) 05/12/2014  . Pleural effusion 05/12/2014  . Hypokalemia 05/12/2014  . Hyponatremia 05/12/2014  . A-fib (HCC) 05/11/2014  . Edema leg 05/11/2014  . Pulmonary edema 05/11/2014  . Tobacco abuse 05/11/2014  . Alcohol abuse 05/11/2014   Ezra SitesLeslie Walsie Smeltz, OTR/L  [phone] 03/17/2016, 1:53 PM  [facility] [address] [place], [place], [address] Phone: [phone]   Fax:  [phone]  Name: Haskell FlirtLinda Huerta MRN: [id number] Date of Birth: 01/11/1946

## 2016-03-21 ENCOUNTER — Encounter (HOSPITAL_COMMUNITY): Payer: Self-pay

## 2016-03-21 ENCOUNTER — Ambulatory Visit (HOSPITAL_COMMUNITY): Payer: PPO

## 2016-03-21 DIAGNOSIS — M25611 Stiffness of right shoulder, not elsewhere classified: Secondary | ICD-10-CM | POA: Diagnosis not present

## 2016-03-21 DIAGNOSIS — R29898 Other symptoms and signs involving the musculoskeletal system: Secondary | ICD-10-CM

## 2016-03-21 NOTE — Therapy (Signed)
[facility] [address] [place], [place], [address] Phone: [phone]   Fax:  [phone]  Occupational Therapy Treatment And mini reassessment Patient Details  Name: Rebecca Huerta MRN: [id number] Date of Birth: 1945/07/01 Referring Provider: Dr. Fuller Canada  Encounter Date: 03/21/2016      OT End of Session - 03/21/16 1548    Visit Number 13   Number of Visits 24   Date for OT Re-Evaluation 04/21/16  mini reassess on 03/22/16   Authorization Type [employer]   Authorization Time Period before 19th visit   Authorization - Visit Number 13   Authorization - Number of Visits 19   OT Start Time 1350   OT Stop Time 1430   OT Time Calculation (min) 40 min   Activity Tolerance Patient limited by fatigue;Patient tolerated treatment well   Behavior During Therapy [facility] for tasks assessed/performed      Past Medical History:  Diagnosis Date  . A-fib (HCC)   . Anxiety   . CHF (congestive heart failure) (HCC)   . Hypertension     Past Surgical History:  Procedure Laterality Date  . TUBAL LIGATION      There were no vitals filed for this visit.      Subjective Assessment - 03/21/16 1351    Subjective  S: I want to wait and see what the Doctor says before I schedule more appointments.   Currently in Pain? No/denies            [facility] OT Assessment - 03/21/16 1351      Assessment   Diagnosis Right Proximal Humerus Fracture     Precautions   Precautions Shoulder   Precaution Comments PROM through 9/25, progress to AA/ROM and A/ROM as tolerated     AROM   Overall AROM Comments assessed in supine, external rotation and internal rotation with shoulder adducted    AROM Assessment Site Shoulder   Right/Left Shoulder Right   Right Shoulder Flexion 130 Degrees  previous: 0   Right Shoulder ABduction 180 Degrees  previous: 30   Right Shoulder Internal Rotation 90 Degrees  previous: 75   Right Shoulder  External Rotation 70 Degrees  previous: 25     PROM   Overall PROM Comments assessed in supine, external rotation and internal rotation with shoulder adducted   PROM Assessment Site Shoulder   Right/Left Shoulder Right   Right Shoulder Flexion 145 Degrees  previous: 100   Right Shoulder ABduction 180 Degrees  previous: 70   Right Shoulder Internal Rotation 90 Degrees  previous: 75   Right Shoulder External Rotation 65 Degrees  previous: 40     Strength   Overall Strength Comments Assessed seated. IR/er adducted. Strength not assessed prior to this session.   Strength Assessment Site Shoulder   Right/Left Shoulder Right   Right Shoulder Flexion 3+/5   Right Shoulder ABduction 3+/5   Right Shoulder Internal Rotation 4+/5   Right Shoulder External Rotation 3/5                  OT Treatments/Exercises (OP) - 03/21/16 1352      Exercises   Exercises Shoulder     Shoulder Exercises: Supine   Protraction PROM;5 reps;AROM;10 reps   Horizontal ABduction PROM;5 reps;AROM;10 reps   External Rotation PROM;5 reps;AROM;10 reps   Internal Rotation PROM;5 reps;AROM;10 reps   Flexion PROM;5 reps;AROM;10 reps   ABduction PROM;5 reps;AROM;10 reps     Shoulder Exercises: Standing  Protraction AAROM;15 reps   Horizontal ABduction AAROM;15 reps   External Rotation AAROM;15 reps   Internal Rotation AAROM;15 reps   Flexion AAROM;15 reps   ABduction AAROM;15 reps   Extension Theraband;12 reps   Theraband Level (Shoulder Extension) Level 2 (Red)   Row Theraband;15 reps   Theraband Level (Shoulder Row) Level 2 (Red)     Shoulder Exercises: Pulleys   Flexion 1 minute   ABduction 1 minute     Shoulder Exercises: ROM/Strengthening   Wall Wash 2'   Proximal Shoulder Strengthening, Supine 12X each no rest breaks   Proximal Shoulder Strengthening, Seated 12X each no rest breaks     Manual Therapy   Manual Therapy Myofascial release   Manual therapy comments manual therapy  completed seperately from all other interventions and assessments this date   Myofascial Release myofascial release and manual stretching to right upper arm, scapular, shoulder region and associated areas to decrease fascial restrictions and improve pain free mobility in right shoulder and arm                   OT Short Term Goals - 03/21/16 1552      OT SHORT TERM GOAL #1   Title Patient will be educated on a HEP.   Time 4   Period Weeks   Status Achieved     OT SHORT TERM GOAL #2   Title Patient will improve right shoulder P/ROM to [facility] in order to improve ability to don and doff clothing with increased ease and less pain.    Time 4   Period Weeks   Status Achieved     OT SHORT TERM GOAL #3   Title Patient will improve right shoulder strength to 3+/5 for improved ability to lift pots and pans when cooking.    Time 4   Period Weeks   Status Achieved     OT SHORT TERM GOAL #4   Title Patient will decrease pain in her right shoulder to 3/10 or better while she is completing ADL tasks.   Time 4   Period Weeks   Status Achieved     OT SHORT TERM GOAL #5   Title Patient will decrease fascial restrictions to min-mod in her right shoulder region for greater mobility needed for ADL completion.    Time 4   Period Weeks   Status Achieved           OT Long Term Goals - 03/21/16 1552      OT LONG TERM GOAL #1   Title Patient will use her right arm as dominant with all functional, work, and leisure activities.    Time 8   Period Weeks   Status On-going     OT LONG TERM GOAL #2   Title Patient will improve right shoulder A/ROM to [facility] in order to reach behind her back and over her head during ADL completion.   Time 8   Period Weeks   Status On-going     OT LONG TERM GOAL #3   Title Patient will improve right shoulder strength to 4+/5 or better in order to be able to use her right arm to lift bags of groceries.   Time 8   Period Weeks   Status On-going     OT  LONG TERM GOAL #4   Title Patient will decrease pain in her right shoulder to 2/10 or better when completing ADLs.   Time 8   Period Weeks   Status  On-going     OT LONG TERM GOAL #5   Title Patient will decrease fascial restrctions to trace in her right shoulder region for greater mobility needed for ADL and work activity completion.    Time 8   Period Weeks   Status On-going               Plan - 03/21/16 1548    Clinical Impression Statement A: Mini reassessment completed this date. patient reports that she is 100 times better than she was when first started therapy. Pt continues to have deficits with strength, A/ROM (shoulder flexion mainly), pain with movement and activity and fascial restrictions.    Plan P: Recommend patient continue OT services dropping down to 2 times a week for 4 weeks to focus on ROM, strength, fascial restrictions, and pain during activity. patient requests to wait until she returns to the MD before scheduling more appointments. Next Treatment session; Update HEP for A/ROM supine. Attempt standing A/ROM when appropriate.    Consulted and Agree with Plan of Care Patient      Patient will benefit from skilled therapeutic intervention in order to improve the following deficits and impairments:  Decreased range of motion, Decreased strength, Decreased skin integrity, Increased edema, Increased fascial restricitons, Increased muscle spasms, Pain, Impaired flexibility  Visit Diagnosis: Stiffness of right shoulder, not elsewhere classified  Other symptoms and signs involving the musculoskeletal system    Problem List Patient Active Problem List   Diagnosis Date Noted  . Cardiomyopathy (HCC) 06/09/2014  . Chronic systolic heart failure (HCC) 06/09/2014  . Malnutrition (HCC) 06/09/2014  . Rash 06/09/2014  . Anticoagulation adequate 06/09/2014  . Pulmonary hypertension   . Acute on chronic systolic CHF (congestive heart failure) (HCC) 05/12/2014  .  Benign essential HTN 05/12/2014  . Anxiety state 05/12/2014  . Atrial fibrillation with RVR (HCC) 05/12/2014  . Postoperative pulmonary edema (HCC) 05/12/2014  . Pleural effusion 05/12/2014  . Hypokalemia 05/12/2014  . Hyponatremia 05/12/2014  . A-fib (HCC) 05/11/2014  . Edema leg 05/11/2014  . Pulmonary edema 05/11/2014  . Tobacco abuse 05/11/2014  . Alcohol abuse 05/11/2014   Limmie PatriciaLaura Essenmacher, OTR/L,CBIS  [phone]  03/21/2016, 4:34 PM  [facility] [address] [place], [place], [address] Phone: [phone]   Fax:  [phone]  Name: Haskell FlirtLinda Clair MRN: [id number] Date of Birth: 06/28/1945

## 2016-03-23 ENCOUNTER — Ambulatory Visit (HOSPITAL_COMMUNITY): Payer: PPO

## 2016-03-23 DIAGNOSIS — M25611 Stiffness of right shoulder, not elsewhere classified: Secondary | ICD-10-CM | POA: Diagnosis not present

## 2016-03-23 DIAGNOSIS — R29898 Other symptoms and signs involving the musculoskeletal system: Secondary | ICD-10-CM

## 2016-03-23 NOTE — Therapy (Signed)
[facility] [address] [place], [place], [address] Phone: [phone]   Fax:  [phone]  Occupational Therapy Treatment  Patient Details  Name: Rebecca Huerta MRN: [id number] Date of Birth: July 25, 1945 Referring Provider: Dr. Fuller Canada  Encounter Date: 03/23/2016      OT End of Session - 03/23/16 1341    Visit Number 14   Number of Visits 24   Date for OT Re-Evaluation 04/21/16  mini reassess on 03/22/16   Authorization Type [employer]   Authorization Time Period before 19th visit   Authorization - Visit Number 14   Authorization - Number of Visits 19   OT Start Time 1300  Pt requested to leave early   OT Stop Time 1330   OT Time Calculation (min) 30 min   Activity Tolerance Patient limited by fatigue;Patient tolerated treatment well   Behavior During Therapy [facility] for tasks assessed/performed      Past Medical History:  Diagnosis Date  . A-fib (HCC)   . Anxiety   . CHF (congestive heart failure) (HCC)   . Hypertension     Past Surgical History:  Procedure Laterality Date  . TUBAL LIGATION      There were no vitals filed for this visit.      Subjective Assessment - 03/23/16 1338    Subjective  S: It feels good until you start working on it.   Currently in Pain? No/denies            [facility] OT Assessment - 03/23/16 1315      Assessment   Diagnosis Right Proximal Humerus Fracture     Precautions   Precautions Shoulder   Precaution Comments PROM through 9/25, progress to AA/ROM and A/ROM as tolerated                  OT Treatments/Exercises (OP) - 03/23/16 1315      Exercises   Exercises Shoulder     Shoulder Exercises: Supine   Protraction PROM;5 reps;AROM;10 reps   Horizontal ABduction PROM;5 reps;AROM;10 reps   External Rotation PROM;5 reps;AROM;10 reps   Internal Rotation PROM;5 reps;AROM;10 reps   Flexion PROM;5 reps;AROM;10 reps   ABduction PROM;5  reps;AROM;10 reps     Shoulder Exercises: Standing   Protraction AAROM;15 reps   Horizontal ABduction AAROM;15 reps   External Rotation AAROM;15 reps   Internal Rotation AAROM;15 reps   Flexion AAROM;15 reps   ABduction AAROM;15 reps     Shoulder Exercises: ROM/Strengthening   Proximal Shoulder Strengthening, Supine 12X each no rest breaks     Manual Therapy   Manual Therapy Myofascial release   Manual therapy comments manual therapy completed seperately from all other interventions and assessments this date   Myofascial Release myofascial release to right lateral scapular region and associated areas to decrease fascial restrictions and improve pain free mobility in right shoulder and arm                   OT Short Term Goals - 03/23/16 1343      OT SHORT TERM GOAL #1   Title Patient will be educated on a HEP.   Time 4   Period Weeks     OT SHORT TERM GOAL #2   Title Patient will improve right shoulder P/ROM to [facility] in order to improve ability to don and doff clothing with increased ease and less pain.    Time 4   Period Weeks  OT SHORT TERM GOAL #3   Title Patient will improve right shoulder strength to 3+/5 for improved ability to lift pots and pans when cooking.    Time 4   Period Weeks     OT SHORT TERM GOAL #4   Title Patient will decrease pain in her right shoulder to 3/10 or better while she is completing ADL tasks.   Time 4   Period Weeks     OT SHORT TERM GOAL #5   Title Patient will decrease fascial restrictions to min-mod in her right shoulder region for greater mobility needed for ADL completion.    Time 4   Period Weeks           OT Long Term Goals - 03/21/16 1552      OT LONG TERM GOAL #1   Title Patient will use her right arm as dominant with all functional, work, and leisure activities.    Time 8   Period Weeks   Status On-going     OT LONG TERM GOAL #2   Title Patient will improve right shoulder [facility] to [facility] in order to reach  behind her back and over her head during ADL completion.   Time 8   Period Weeks   Status On-going     OT LONG TERM GOAL #3   Title Patient will improve right shoulder strength to 4+/5 or better in order to be able to use her right arm to lift bags of groceries.   Time 8   Period Weeks   Status On-going     OT LONG TERM GOAL #4   Title Patient will decrease pain in her right shoulder to 2/10 or better when completing ADLs.   Time 8   Period Weeks   Status On-going     OT LONG TERM GOAL #5   Title Patient will decrease fascial restrctions to trace in her right shoulder region for greater mobility needed for ADL and work activity completion.    Time 8   Period Weeks   Status On-going               Plan - 03/23/16 1341    Clinical Impression Statement A: Session shortened today per patient's request. Pt noted muscle knot located in right lateral scapular region. Continued to complete A/ROM supine and AA/ROM standing. VC for form and technique.   Plan P: Attempt A/ROM standing. Patient requesting to wait until her follow up appointment with Doctor on 10/30 before deciding to continue therapy. Update HEP.      Patient will benefit from skilled therapeutic intervention in order to improve the following deficits and impairments:  Decreased range of motion, Decreased strength, Decreased skin integrity, Increased edema, Increased fascial restricitons, Increased muscle spasms, Pain, Impaired flexibility  Visit Diagnosis: Stiffness of right shoulder, not elsewhere classified  Other symptoms and signs involving the musculoskeletal system    Problem List Patient Active Problem List   Diagnosis Date Noted  . Cardiomyopathy (HCC) 06/09/2014  . Chronic systolic heart failure (HCC) 06/09/2014  . Malnutrition (HCC) 06/09/2014  . Rash 06/09/2014  . Anticoagulation adequate 06/09/2014  . Pulmonary hypertension   . Acute on chronic systolic CHF (congestive heart failure) (HCC)  05/12/2014  . Benign essential HTN 05/12/2014  . Anxiety state 05/12/2014  . Atrial fibrillation with RVR (HCC) 05/12/2014  . Postoperative pulmonary edema (HCC) 05/12/2014  . Pleural effusion 05/12/2014  . Hypokalemia 05/12/2014  . Hyponatremia 05/12/2014  . A-fib (HCC) 05/11/2014  .  Edema leg 05/11/2014  . Pulmonary edema 05/11/2014  . Tobacco abuse 05/11/2014  . Alcohol abuse 05/11/2014   Limmie Patricia, OTR/L,CBIS  [phone]  03/23/2016, 1:44 PM  [facility] [address] [place], [place], [address] Phone: [phone]   Fax:  [phone]  Name: Amariyah Bazar MRN: [id number] Date of Birth: 1945/09/27

## 2016-03-24 ENCOUNTER — Telehealth (HOSPITAL_COMMUNITY): Payer: Self-pay

## 2016-03-24 ENCOUNTER — Ambulatory Visit (HOSPITAL_COMMUNITY): Payer: PPO | Admitting: Occupational Therapy

## 2016-03-24 NOTE — Telephone Encounter (Signed)
03/24/16  She said that a friend had stopped by for today and she won't be able to come.  She said she would call back to reschedule

## 2016-04-04 ENCOUNTER — Other Ambulatory Visit: Payer: Self-pay | Admitting: Cardiovascular Disease

## 2016-04-10 ENCOUNTER — Ambulatory Visit (INDEPENDENT_AMBULATORY_CARE_PROVIDER_SITE_OTHER): Payer: PPO | Admitting: Orthopedic Surgery

## 2016-04-10 ENCOUNTER — Encounter: Payer: Self-pay | Admitting: Orthopedic Surgery

## 2016-04-10 DIAGNOSIS — S4291XD Fracture of right shoulder girdle, part unspecified, subsequent encounter for fracture with routine healing: Secondary | ICD-10-CM

## 2016-04-10 NOTE — Progress Notes (Signed)
Patient ID: Rebecca Huerta, female   DOB: 08/31/1945, 70 y.o.   MRN: [id number]  Chief Complaint  Patient presents with  . Follow-up    right proximal fx, doi 01/25/16    HPI Rebecca Huerta is a 70 y.o. female.   HPI  2 months 2 weeks status post right proximal humerus fracture. The patient had therapy and decided to wait until she saw me again to determine if she needed any more  She's doing very well she can toilet she can feed himself she can comb her hair and her pain is controlled  Review of Systems Review of Systems   Physical Exam There were no vitals taken for this visit.   Physical Exam  Her rotator cuff strength is good her flexion is 110 without pain  I released her today

## 2016-04-11 ENCOUNTER — Ambulatory Visit: Payer: PPO | Admitting: Orthopedic Surgery

## 2016-05-04 ENCOUNTER — Other Ambulatory Visit: Payer: Self-pay | Admitting: Cardiovascular Disease

## 2016-08-07 ENCOUNTER — Other Ambulatory Visit: Payer: Self-pay | Admitting: Cardiovascular Disease

## 2016-08-22 ENCOUNTER — Other Ambulatory Visit: Payer: Self-pay | Admitting: Cardiovascular Disease

## 2016-09-07 ENCOUNTER — Other Ambulatory Visit: Payer: Self-pay | Admitting: Cardiovascular Disease

## 2016-10-06 ENCOUNTER — Ambulatory Visit (INDEPENDENT_AMBULATORY_CARE_PROVIDER_SITE_OTHER): Payer: PPO | Admitting: Cardiovascular Disease

## 2016-10-06 ENCOUNTER — Encounter: Payer: Self-pay | Admitting: Cardiovascular Disease

## 2016-10-06 VITALS — BP 156/96 | HR 68 | Ht 63.5 in | Wt 137.0 lb

## 2016-10-06 DIAGNOSIS — I10 Essential (primary) hypertension: Secondary | ICD-10-CM | POA: Diagnosis not present

## 2016-10-06 DIAGNOSIS — I429 Cardiomyopathy, unspecified: Secondary | ICD-10-CM | POA: Diagnosis not present

## 2016-10-06 DIAGNOSIS — I482 Chronic atrial fibrillation, unspecified: Secondary | ICD-10-CM

## 2016-10-06 NOTE — Patient Instructions (Signed)
Your physician wants you to follow-up in: 1 year You will receive a reminder letter in the mail two months in advance. If you don't receive a letter, please call our office to schedule the follow-up appointment.   Your physician recommends that you continue on your current medications as directed. Please refer to the Current Medication list given to you today.    If you need a refill on your cardiac medications before your next appointment, please call your pharmacy.      Thank you for choosing [facility] !        

## 2016-10-06 NOTE — Progress Notes (Signed)
SUBJECTIVE: The patient presents for follow-up of atrial fibrillation, cardiomyopathy with chronic systolic heart failure, and hypertension. Left ventricular ejection fraction normalized with most recent EF 55-60% in 04/2015. It also showed severe left atrial dilatation and mild mitral regurgitation.  The patient denies any symptoms of chest pain, palpitations, shortness of breath, lightheadedness, dizziness, leg swelling, orthopnea, PND, and syncope.  She checks her blood pressure at home regularly and it runs 100/60 normally. She says she has white coat hypertension.  She brought in dandelion jam.     Review of Systems: As per "subjective", otherwise negative.  Allergies  Allergen Reactions  . Penicillins Rash    Current Outpatient Prescriptions  Medication Sig Dispense Refill  . carvedilol (COREG) 12.5 MG tablet take 1 tablet twice a day with food 180 tablet 2  . DIGOX 125 MCG tablet take 1 tablet once daily 90 tablet 2  . folic acid (FOLVITE) 1 MG tablet take 1 tablet once daily 30 tablet 6  . lisinopril (PRINIVIL,ZESTRIL) 10 MG tablet take 1 tablet by mouth twice a day 180 tablet 2  . Rivaroxaban (XARELTO) 15 MG TABS tablet Take 1 tablet (15 mg total) by mouth daily with supper. 90 tablet 2  . thiamine 100 MG tablet Take 1 tablet (100 mg total) by mouth daily. 90 tablet 2   No current facility-administered medications for this visit.     Past Medical History:  Diagnosis Date  . A-fib (HCC)   . Anxiety   . CHF (congestive heart failure) (HCC)   . Hypertension     Past Surgical History:  Procedure Laterality Date  . TUBAL LIGATION      Social History   Social History  . Marital status: Married    Spouse name: N/A  . Number of children: N/A  . Years of education: N/A   Occupational History  . Not on file.   Social History Main Topics  . Smoking status: Former Smoker    Packs/day: 1.00    Types: Cigarettes    Start date: 05/17/1961    Quit date:  05/10/2014  . Smokeless tobacco: Never Used  . Alcohol use No  . Drug use: No  . Sexual activity: Not Currently   Other Topics Concern  . Not on file   Social History Narrative  . No narrative on file     Vitals:   10/06/16 1118  BP: (!) 156/96  Pulse: 68  SpO2: 99%  Weight: 137 lb (62.1 kg)  Height: 5' 3.5" (1.613 m)    Wt Readings from Last 3 Encounters:  10/06/16 137 lb (62.1 kg)  02/10/16 145 lb (65.8 kg)  01/27/16 145 lb (65.8 kg)     PHYSICAL EXAM General: NAD HEENT: Normal. Neck: No JVD, no thyromegaly. Lungs: Clear to auscultation bilaterally with normal respiratory effort. CV: Nondisplaced PMI.  Regular rate and irregular rhythm, normal S1/S2, no S3, no murmur. No pretibial or periankle edema.  No carotid bruit.   Abdomen: Soft, nontender, no distention.  Neurologic: Alert and oriented.  Psych: Normal affect. Skin: Normal. Musculoskeletal: No gross deformities.    ECG: Most recent ECG reviewed.   Labs: Lab Results  Component Value Date/Time   K 4.9 06/10/2014 08:11 AM   BUN 16 06/10/2014 08:11 AM   CREATININE 0.90 06/10/2014 08:11 AM   ALT 33 05/13/2014 10:52 AM   TSH 3.260 05/11/2014 06:11 PM   HGB 13.5 05/16/2014 03:11 AM     Lipids: No results found  for: LDLCALC, LDLDIRECT, CHOL, TRIG, HDL     ASSESSMENT AND PLAN: 1. Permanent atrial fibrillation: Symptomatically stable on carvedilol and digoxin. Anticoagulated with Xarelto. No changes to therapy.  2. Cardiomyopathy with chronic systolic heart failure, EF now normalized 55-60%: It was thought to be tachycardia mediated. Continue carvedilol, lisinopril, and digoxin.  3. Essential hypertension: Elevated in the office but well controlled at home. She has white coat hypertension. No changes to therapy.    Disposition: Follow up 1 yr  Prentice Docker, M.D., F.A.C.C.

## 2016-12-14 ENCOUNTER — Encounter (HOSPITAL_COMMUNITY): Payer: Self-pay | Admitting: Occupational Therapy

## 2016-12-14 NOTE — Therapy (Signed)
[facility] [place], [place], [address] Phone: [phone]   Fax:  [phone]  Patient Details  Name: Rebecca Huerta MRN: [id number] Date of Birth: [birth year] Referring Provider:  No ref. provider found  Encounter Date: 12/14/2016   OCCUPATIONAL THERAPY DISCHARGE SUMMARY  Visits from Start of Care: 13  Current functional level related to goals / functional outcomes: Unknown. Pt requested to hold therapy until MD appt at last treatment session on 03/23/16.  Pt has not followed up with rehab and MD released pt on 04/10/16. As of reassessment on 10/10 pt met all STGs and no LTGs.    Remaining deficits: Unknown   Education / Equipment: Continuing AA/ROM and A/ROM at last treatment session.  Plan: Patient agrees to discharge.  Patient goals were partially met. Patient is being discharged due to being pleased with the current functional level.  ?????      OT Short Term Goals - 03/21/16 1552            OT SHORT TERM GOAL #1   Title Patient will be educated on a HEP.   Time 4   Period Weeks   Status Achieved       OT SHORT TERM GOAL #2   Title Patient will improve right shoulder P/ROM to [facility] in order to improve ability to don and doff clothing with increased ease and less pain.    Time 4   Period Weeks   Status Achieved       OT SHORT TERM GOAL #3   Title Patient will improve right shoulder strength to 3+/5 for improved ability to lift pots and pans when cooking.    Time 4   Period Weeks   Status Achieved       OT SHORT TERM GOAL #4   Title Patient will decrease pain in her right shoulder to 3/10 or better while she is completing ADL tasks.   Time 4   Period Weeks   Status Achieved       OT SHORT TERM GOAL #5   Title Patient will decrease fascial restrictions to min-mod in her right shoulder region for greater mobility needed for ADL completion.    Time 4   Period Weeks   Status  Achieved              OT Long Term Goals - 03/21/16 1552            OT LONG TERM GOAL #1   Title Patient will use her right arm as dominant with all functional, work, and leisure activities.    Time 8   Period Weeks   Status On-going       OT LONG TERM GOAL #2   Title Patient will improve right shoulder A/ROM to [facility] in order to reach behind her back and over her head during ADL completion.   Time 8   Period Weeks   Status On-going       OT LONG TERM GOAL #3   Title Patient will improve right shoulder strength to 4+/5 or better in order to be able to use her right arm to lift bags of groceries.   Time 8   Period Weeks   Status On-going       OT LONG TERM GOAL #4   Title Patient will decrease pain in her right shoulder to 2/10 or better when completing ADLs.   Time 8   Period Weeks   Status On-going  OT LONG TERM GOAL #5   Title Patient will decrease fascial restrctions to trace in her right shoulder region for greater mobility needed for ADL and work activity completion.    Time 8   Period Weeks   Status On-going       Guadelupe Sabin, OTR/L  [phone] 12/14/2016, 1:53 PM  [facility] [address] [place], [place], [address] Phone: [phone]   Fax:  [phone]

## 2016-12-21 ENCOUNTER — Telehealth: Payer: Self-pay | Admitting: *Deleted

## 2016-12-21 NOTE — Telephone Encounter (Signed)
Pt given I month of Xarelto samples

## 2017-01-25 ENCOUNTER — Other Ambulatory Visit: Payer: Self-pay | Admitting: *Deleted

## 2017-01-25 MED ORDER — RIVAROXABAN 15 MG PO TABS: 15.0000 mg | ORAL_TABLET | Freq: Every day | ORAL | 3 refills | Status: DC

## 2017-03-06 ENCOUNTER — Other Ambulatory Visit: Payer: Self-pay | Admitting: Cardiovascular Disease

## 2017-04-06 ENCOUNTER — Other Ambulatory Visit: Payer: Self-pay

## 2017-04-06 MED ORDER — DIGOXIN 125 MCG PO TABS: 125.0000 ug | ORAL_TABLET | Freq: Every day | ORAL | 2 refills | Status: DC

## 2017-04-06 NOTE — Telephone Encounter (Signed)
Refilled digoxin per fax request

## 2017-10-16 ENCOUNTER — Ambulatory Visit: Payer: PPO | Admitting: Cardiovascular Disease

## 2017-10-16 ENCOUNTER — Encounter: Payer: Self-pay | Admitting: Cardiovascular Disease

## 2017-10-16 VITALS — BP 162/88 | HR 64 | Ht 64.0 in | Wt 127.0 lb

## 2017-10-16 DIAGNOSIS — I429 Cardiomyopathy, unspecified: Secondary | ICD-10-CM

## 2017-10-16 DIAGNOSIS — I482 Chronic atrial fibrillation, unspecified: Secondary | ICD-10-CM

## 2017-10-16 DIAGNOSIS — I10 Essential (primary) hypertension: Secondary | ICD-10-CM

## 2017-10-16 NOTE — Progress Notes (Signed)
ekg 

## 2017-10-16 NOTE — Patient Instructions (Addendum)
Your physician wants you to follow-up in:  1 year with Dr.Koneswaran You will receive a reminder letter in the mail two months in advance. If you don't receive a letter, please call our office to schedule the follow-up appointment.    Your physician recommends that you continue on your current medications as directed. Please refer to the Current Medication list given to you today.    If you need a refill on your cardiac medications before your next appointment, please call your pharmacy.      No lab work or tests ordered today.      Thank you for choosing [facility] !        

## 2017-10-16 NOTE — Progress Notes (Signed)
SUBJECTIVE: The patient presents for follow-up of atrial fibrillation, cardiomyopathy with chronic systolic heart failure, and hypertension. Left ventricular ejection fraction normalized with most recent EF 55-60% in 04/2015. It also showed severe left atrial dilatation and mild mitral regurgitation.  ECG performed today which I personally interpreted demonstrated atrial fibrillation with late R wave transition and nonspecific ST segment and T wave inversions in inferolateral leads suggestive of ischemia.  The patient denies any symptoms of chest pain, palpitations, shortness of breath, lightheadedness, dizziness, leg swelling, orthopnea, PND, and syncope.  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  She jogs and walks and stays as active as she can.      Review of Systems: As per "subjective", otherwise negative.  Allergies  Allergen Reactions  . Penicillins Rash    Current Outpatient Medications  Medication Sig Dispense Refill  . carvedilol (COREG) 12.5 MG tablet take 1 tablet twice a day with food 180 tablet 2  . digoxin (DIGOX) 0.125 MG tablet Take 1 tablet (125 mcg total) by mouth daily. 90 tablet 2  . folic acid (FOLVITE) 1 MG tablet take 1 tablet by mouth once daily 30 tablet 6  . lisinopril (PRINIVIL,ZESTRIL) 10 MG tablet take 1 tablet by mouth twice a day 180 tablet 2  . Rivaroxaban (XARELTO) 15 MG TABS tablet Take 1 tablet (15 mg total) by mouth daily with supper. 90 tablet 3  . thiamine 100 MG tablet Take 1 tablet (100 mg total) by mouth daily. 90 tablet 2   No current facility-administered medications for this visit.     Past Medical History:  Diagnosis Date  . A-fib (HCC)   . Anxiety   . CHF (congestive heart failure) (HCC)   . Hypertension     Past Surgical History:  Procedure Laterality Date  . TUBAL LIGATION      Social History    Socioeconomic History  . Marital status: Married    Spouse name: Not on file  . Number of children: Not on file  . Years of education: Not on file  . Highest education level: Not on file  Occupational History  . Not on file  Social Needs  . Financial resource strain: Not on file  . Food insecurity:    Worry: Not on file    Inability: Not on file  . Transportation needs:    Medical: Not on file    Non-medical: Not on file  Tobacco Use  . Smoking status: Current Every Day Smoker    Packs/day: 1.00    Types: Cigarettes    Start date: 05/17/1961  . Smokeless tobacco: Never Used  Substance and Sexual Activity  . Alcohol use: No  . Drug use: No  . Sexual activity: Not Currently  Lifestyle  . Physical activity:    Days per week: Not on file    Minutes per session: Not on file  . Stress: Not on file  Relationships  . Social connections:    Talks on phone: Not on file    Gets together: Not on file    Attends religious service: Not on file    Active member of club or organization: Not on file    Attends meetings of clubs or organizations: Not on file    Relationship status: Not on file  . Intimate partner violence:    Fear of current or ex  partner: Not on file    Emotionally abused: Not on file    Physically abused: Not on file    Forced sexual activity: Not on file  Other Topics Concern  . Not on file  Social History Narrative  . Not on file     Vitals:   10/16/17 1109  BP: (!) 162/88  Pulse: 64  SpO2: 98%  Weight: 127 lb (57.6 kg)  Height:  (1.626 m)    Wt Readings from Last 3 Encounters:  10/16/17 127 lb (57.6 kg)  10/06/16 137 lb (62.1 kg)  02/10/16 145 lb (65.8 kg)     PHYSICAL EXAM General: NAD HEENT: Normal. Neck: No JVD, no thyromegaly. Lungs: Clear to auscultation bilaterally with normal respiratory effort. CV: Regular rate and irregular rhythm, normal S1/S2, no S3, no murmur. No pretibial or periankle edema.  No carotid bruit.    Abdomen: Soft, nontender, no distention.  Neurologic: Alert and oriented.  Psych: Normal affect. Skin: Normal. Musculoskeletal: No gross deformities.    ECG: Most recent ECG reviewed.   Labs: Lab Results  Component Value Date/Time   K 4.9 06/10/2014 08:11 AM   BUN 16 06/10/2014 08:11 AM   CREATININE 0.90 06/10/2014 08:11 AM   ALT 33 05/13/2014 10:52 AM   TSH 3.260 05/11/2014 06:11 PM   HGB 13.5 05/16/2014 03:11 AM     Lipids: No results found for: LDLCALC, LDLDIRECT, CHOL, TRIG, HDL     ASSESSMENT AND PLAN: 1. Permanent atrial fibrillation: Symptomatically stable on carvedilol and digoxin. Anticoagulated with Xarelto.  No changes to therapy.  2. Cardiomyopathy with chronic systolic heart failure, EF now normalized 55-60%: It was thought to be tachycardia mediated. Continue carvedilol, digoxin, and lisinopril.  Change to therapy.  3. Essential hypertension: Elevated in the office but well controlled at home. She has white coat hypertension. No changes to therapy.       Disposition: Follow up 1 year   Prentice Docker, M.D., F.A.C.C.

## 2017-12-12 ENCOUNTER — Other Ambulatory Visit: Payer: Self-pay | Admitting: *Deleted

## 2017-12-12 MED ORDER — FOLIC ACID 1 MG PO TABS: 1.0000 mg | ORAL_TABLET | Freq: Every day | ORAL | 11 refills | Status: DC

## 2017-12-28 ENCOUNTER — Telehealth: Payer: Self-pay | Admitting: Cardiovascular Disease

## 2017-12-28 MED ORDER — LISINOPRIL 10 MG PO TABS: 10.0000 mg | ORAL_TABLET | Freq: Two times a day (BID) | ORAL | 3 refills | Status: DC

## 2017-12-28 NOTE — Telephone Encounter (Signed)
Refill complete 

## 2017-12-28 NOTE — Telephone Encounter (Signed)
Pt came into office stating [facility] denied filling her lisinopril (PRINIVIL,ZESTRIL) 10 MG tablet [[id number]][[id number]]  And she's completely out.

## 2018-01-25 ENCOUNTER — Other Ambulatory Visit: Payer: Self-pay | Admitting: Cardiovascular Disease

## 2018-01-25 NOTE — Telephone Encounter (Signed)
°*  STAT* If patient is at the pharmacy, call can be transferred to refill team.   1. Which medications need to be refilled? (please list name of each medication and dose if known)  Rivaroxaban (XARELTO) 15 MG TABS tablet [[id number]][[id number]]   2. Which pharmacy/location (including street and city if local pharmacy) is medication to be sent to? [facility] [address]   3. Do they need a 30 day or 90 day supply?  90 DAY

## 2018-01-28 ENCOUNTER — Telehealth: Payer: Self-pay | Admitting: Cardiovascular Disease

## 2018-01-28 MED ORDER — RIVAROXABAN 15 MG PO TABS: 15.0000 mg | ORAL_TABLET | Freq: Every day | ORAL | 3 refills | Status: DC

## 2018-01-28 NOTE — Telephone Encounter (Signed)
Needing a refill on her Rivaroxaban (XARELTO) 15 MG TABS tablet [[id number]][[id number]]

## 2018-01-28 NOTE — Telephone Encounter (Signed)
Refill complete 

## 2018-03-04 ENCOUNTER — Other Ambulatory Visit: Payer: Self-pay | Admitting: Cardiovascular Disease

## 2018-06-01 ENCOUNTER — Other Ambulatory Visit: Payer: Self-pay | Admitting: Cardiovascular Disease

## 2018-10-03 ENCOUNTER — Telehealth: Payer: Self-pay | Admitting: Cardiovascular Disease

## 2018-10-03 NOTE — Telephone Encounter (Signed)
Virtual Visit Pre-Appointment Phone Call  "(Name), I am calling you today to discuss your upcoming appointment. We are currently trying to limit exposure to the virus that causes COVID-19 by seeing patients at home rather than in the office."  1. "What is the BEST phone number to call the day of the visit?" - include this in appointment notes  2. Do you have or have access to (through a family member/friend) a smartphone with video capability that we can use for your visit?" a. If yes - list this number in appt notes as cell (if different from BEST phone #) and list the appointment type as a VIDEO visit in appointment notes b. If no - list the appointment type as a PHONE visit in appointment notes  3. Confirm consent - "In the setting of the current Covid19 crisis, you are scheduled for a (phone or video) visit with your provider on (date) at (time).  Just as we do with many in-office visits, in order for you to participate in this visit, we must obtain consent.  If you'd like, I can send this to your mychart (if signed up) or email for you to review.  Otherwise, I can obtain your verbal consent now.  All virtual visits are billed to your insurance company just like a normal visit would be.  By agreeing to a virtual visit, we'd like you to understand that the technology does not allow for your provider to perform an examination, and thus may limit your provider's ability to fully assess your condition. If your provider identifies any concerns that need to be evaluated in person, we will make arrangements to do so.  Finally, though the technology is pretty good, we cannot assure that it will always work on either your or our end, and in the setting of a video visit, we may have to convert it to a phone-only visit.  In either situation, we cannot ensure that we have a secure connection.  Are you willing to proceed?" STAFF: Did the patient verbally acknowledge consent to telehealth visit? Document  YES/NO here: Yes  4. Advise patient to be prepared - "Two hours prior to your appointment, go ahead and check your blood pressure, pulse, oxygen saturation, and your weight (if you have the equipment to check those) and write them all down. When your visit starts, your provider will ask you for this information. If you have an [place] Watch or Kardia device, please plan to have heart rate information ready on the day of your appointment. Please have a pen and paper handy nearby the day of the visit as well."  5. Give patient instructions for MyChart download to smartphone OR [email or web address] as below if video visit (depending on what platform provider is using)  6. Inform patient they will receive a phone call 15 minutes prior to their appointment time (may be from unknown caller ID) so they should be prepared to answer    TELEPHONE CALL NOTE  Rebecca Huerta has been deemed a candidate for a follow-up tele-health visit to limit community exposure during the Covid-19 pandemic. I spoke with the patient via phone to ensure availability of phone/video source, confirm preferred email & phone number, and discuss instructions and expectations.  I reminded Rebecca Huerta to be prepared with any vital sign and/or heart rhythm information that could potentially be obtained via home monitoring, at the time of her visit. I reminded Rebecca Huerta to expect a phone call prior to her visit.  Virgel Gesserry L Goins 10/03/2018 4:01 PM

## 2018-10-09 ENCOUNTER — Telehealth (INDEPENDENT_AMBULATORY_CARE_PROVIDER_SITE_OTHER): Payer: PPO | Admitting: Cardiovascular Disease

## 2018-10-09 ENCOUNTER — Telehealth: Payer: Self-pay | Admitting: Cardiovascular Disease

## 2018-10-09 ENCOUNTER — Encounter: Payer: Self-pay | Admitting: Cardiovascular Disease

## 2018-10-09 VITALS — BP 146/65 | HR 60 | Wt 120.0 lb

## 2018-10-09 DIAGNOSIS — I429 Cardiomyopathy, unspecified: Secondary | ICD-10-CM

## 2018-10-09 DIAGNOSIS — I10 Essential (primary) hypertension: Secondary | ICD-10-CM

## 2018-10-09 DIAGNOSIS — I4821 Permanent atrial fibrillation: Secondary | ICD-10-CM | POA: Diagnosis not present

## 2018-10-09 NOTE — Progress Notes (Signed)
Virtual Visit via Telephone Note   This visit type was conducted due to national recommendations for restrictions regarding the COVID-19 Pandemic (e.g. social distancing) in an effort to limit this patient's exposure and mitigate transmission in our community.  Due to her co-morbid illnesses, this patient is at least at moderate risk for complications without adequate follow up.  This format is felt to be most appropriate for this patient at this time.  The patient did not have access to video technology/had technical difficulties with video requiring transitioning to audio format only (telephone).  All issues noted in this document were discussed and addressed.  No physical exam could be performed with this format.  Please refer to the patient's chart for her  consent to telehealth for [facility].   Evaluation Performed:  Follow-up visit  Date:  10/09/2018   ID:  Rebecca Huerta, DOB Jul 12, 1945, MRN [id number]  Patient Location: Home Provider Location: Home  PCP:  Delorse Lek, MD  Cardiologist:  Prentice Docker, MD  Electrophysiologist:  None   Chief Complaint:  CHF, atrial fibrillation  History of Present Illness:    Rebecca Huerta is a 73 y.o. female with a h/o atrial fibrillation, cardiomyopathy with chronic systolic heart failure, and hypertension. Left ventricular ejection fraction normalized with most recent EF 55-60% in 04/2015.It also showed severe left atrial dilatation and mild mitral regurgitation.  The patient denies any symptoms of chest pain, palpitations, shortness of breath, lightheadedness, dizziness, leg swelling, orthopnea, PND, and syncope.  She still works for the [employer].  The patient does not have symptoms concerning for COVID-19 infection (fever, chills, cough, or new shortness of breath).    Past Medical History:  Diagnosis Date  . A-fib (HCC)   . Anxiety   . CHF (congestive heart failure) (HCC)   . Hypertension    Past Surgical History:   Procedure Laterality Date  . TUBAL LIGATION       Current Meds  Medication Sig  . carvedilol (COREG) 12.5 MG tablet TAKE 1 TABLET BY MOUTH TWICE A DAY WITH FOOD  . DIGOX 125 MCG tablet TAKE 1 TABLET BY MOUTH ONCE DAILY  . folic acid (FOLVITE) 1 MG tablet Take 1 tablet (1 mg total) by mouth daily.  Marland Kitchen lisinopril (PRINIVIL,ZESTRIL) 10 MG tablet Take 1 tablet (10 mg total) by mouth 2 (two) times daily.  . Rivaroxaban (XARELTO) 15 MG TABS tablet Take 1 tablet (15 mg total) by mouth daily with supper.  . thiamine 100 MG tablet Take 1 tablet (100 mg total) by mouth daily.     Allergies:   Penicillins   Social History   Tobacco Use  . Smoking status: Current Every Day Smoker    Packs/day: 0.50    Types: Cigarettes    Start date: 05/17/1961  . Smokeless tobacco: Never Used  Substance Use Topics  . Alcohol use: No  . Drug use: No     Family Hx: The patient's Family history is unknown by patient.  ROS:   Please see the history of present illness.     All other systems reviewed and are negative.   Prior CV studies:   The following studies were reviewed today:  Echo reviewed above  Labs/Other Tests and Data Reviewed:    EKG:  No ECG reviewed.  Recent Labs: No results found for requested labs within last 8760 hours.   Recent Lipid Panel No results found for: CHOL, TRIG, HDL, CHOLHDL, LDLCALC, LDLDIRECT  Wt Readings from Last 3 Encounters:  10/09/18 120 lb (54.4 kg)  10/16/17 127 lb (57.6 kg)  10/06/16 137 lb (62.1 kg)     Objective:    Vital Signs:  BP (!) 146/65   Pulse 60   Wt 120 lb (54.4 kg)   BMI 20.60 kg/m    VITAL SIGNS:  reviewed  ASSESSMENT & PLAN:    1.Permanent atrial fibrillation:Symptomatically stable on carvedilol and digoxin. I will stop digoxin.Anticoagulated with Xarelto.    2. Cardiomyopathy: EF now normalized 55-60%. It was thought to be tachycardia mediated. Continue carvedilol and lisinopril. I will stop digoxin.  3. Essential  hypertension:Mildly elevated today. She has white coat hypertension. No changes to therapy.   COVID-19 Education: The signs and symptoms of COVID-19 were discussed with the patient and how to seek care for testing (follow up with PCP or arrange E-visit).  The importance of social distancing was discussed today.  Time:   Today, I have spent 15 minutes with the patient with telehealth technology discussing the above problems.     Medication Adjustments/Labs and Tests Ordered: Current medicines are reviewed at length with the patient today.  Concerns regarding medicines are outlined above.   Tests Ordered: No orders of the defined types were placed in this encounter.   Medication Changes: No orders of the defined types were placed in this encounter.   Disposition:  Follow up in 1 year(s)  Signed, Prentice DockerSuresh Tawn Fitzner, MD  10/09/2018 10:29 AM    [facility]

## 2018-10-09 NOTE — Telephone Encounter (Signed)
Patient called stating that she needs to know about stopping a medication . States she had phone visit with Dr. Purvis Sheffield today.   [phone])

## 2018-10-09 NOTE — Addendum Note (Signed)
Addended by: Lesle Chris on: 10/09/2018 10:47 AM   Modules accepted: Orders

## 2018-10-09 NOTE — Patient Instructions (Signed)
Medication Instructions:   Stop Digoxin.    Continue all other medications.    Labwork: none  Testing/Procedures: none  Follow-Up: Your physician wants you to follow up in:  1 year.  You will receive a reminder letter in the mail one-two months in advance.  If you don't receive a letter, please call our office to schedule the follow up appointment   Any Other Special Instructions Will Be Listed Below (If Applicable).  If you need a refill on your cardiac medications before your next appointment, please call your pharmacy.  

## 2018-10-09 NOTE — Telephone Encounter (Signed)
Patient notified - can stop Digoxin now as she no loner needs this medication.    Per Dr. Purvis Sheffield OV note - Cardiomyopathy: EF now normalized 55-60%. It was thought to be tachycardia mediated. Continue carvedilolandlisinopril.I will stop digoxin.

## 2018-10-10 ENCOUNTER — Ambulatory Visit: Payer: PPO | Admitting: Cardiovascular Disease

## 2018-11-08 ENCOUNTER — Other Ambulatory Visit: Payer: Self-pay | Admitting: Cardiovascular Disease

## 2018-11-15 ENCOUNTER — Other Ambulatory Visit: Payer: Self-pay | Admitting: Cardiovascular Disease

## 2019-01-08 ENCOUNTER — Other Ambulatory Visit: Payer: Self-pay | Admitting: Cardiovascular Disease

## 2019-01-08 MED ORDER — RIVAROXABAN 15 MG PO TABS: 15.0000 mg | ORAL_TABLET | Freq: Every day | ORAL | 3 refills | Status: DC

## 2019-01-08 NOTE — Telephone Encounter (Signed)
Refill done.  

## 2019-01-08 NOTE — Telephone Encounter (Signed)
New message     *STAT* If patient is at the pharmacy, call can be transferred to refill team.   1. Which medications need to be refilled? (please list name of each medication and dose if known)  xarelto  2. Which pharmacy/location (including street and city if local pharmacy) is medication to be sent to? [facility] [address]   3. Do they need a 30 day or 90 day supply? [place]

## 2019-04-29 ENCOUNTER — Other Ambulatory Visit: Payer: Self-pay

## 2019-04-29 MED ORDER — LISINOPRIL 10 MG PO TABS: ORAL_TABLET | ORAL | 3 refills | Status: DC

## 2019-04-29 NOTE — Telephone Encounter (Signed)
Refilled lisinopril

## 2019-05-20 ENCOUNTER — Other Ambulatory Visit: Payer: Self-pay

## 2019-05-20 MED ORDER — CARVEDILOL 12.5 MG PO TABS: 12.5000 mg | ORAL_TABLET | Freq: Two times a day (BID) | ORAL | 1 refills | Status: DC

## 2019-05-20 NOTE — Telephone Encounter (Signed)
Refilled coreg.

## 2019-10-13 ENCOUNTER — Encounter: Payer: Self-pay | Admitting: Cardiovascular Disease

## 2019-10-13 ENCOUNTER — Ambulatory Visit: Payer: PPO | Admitting: Cardiovascular Disease

## 2019-10-13 ENCOUNTER — Other Ambulatory Visit: Payer: Self-pay

## 2019-10-13 VITALS — BP 167/116 | HR 104 | Temp 97.3°F | Ht 64.0 in | Wt 134.0 lb

## 2019-10-13 DIAGNOSIS — Z7901 Long term (current) use of anticoagulants: Secondary | ICD-10-CM

## 2019-10-13 DIAGNOSIS — I4821 Permanent atrial fibrillation: Secondary | ICD-10-CM

## 2019-10-13 DIAGNOSIS — I10 Essential (primary) hypertension: Secondary | ICD-10-CM

## 2019-10-13 DIAGNOSIS — I429 Cardiomyopathy, unspecified: Secondary | ICD-10-CM | POA: Diagnosis not present

## 2019-10-13 MED ORDER — LISINOPRIL 20 MG PO TABS: 20.0000 mg | ORAL_TABLET | Freq: Two times a day (BID) | ORAL | 3 refills | Status: DC

## 2019-10-13 NOTE — Patient Instructions (Addendum)
Medication Instructions:  INCREASE Lisinopril to 20 mg twice a day   Labwork: None today   Procedures/Testing: None today  Follow-Up:6 months office visit with Randall An, PA-C   Any Additional Special Instructions Will Be Listed Below (If Applicable).  Check blood pressure at home 3 times a week for 1 month.Drop of recordings to office   If you need a refill on your cardiac medications before your next appointment, please call your pharmacy.

## 2019-10-13 NOTE — Progress Notes (Signed)
SUBJECTIVE: Rebecca Huerta is a 74 year old woman with a history of atrial fibrillation, cardiomyopathy with chronic systolic heart failure, and hypertension.  Left ventricular ejection fraction normalized with most recent EF 55-60% in 04/2015.It also showed severe left atrial dilatation and mild mitral regurgitation.  The patient denies any symptoms of chest pain, palpitations, shortness of breath, lightheadedness, dizziness, leg swelling, orthopnea, PND, and syncope.  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  She attributes her elevated blood pressure and heart rate today to drinking cold water during the time of my evaluation.  Review of Systems: As per "subjective", otherwise negative.  Allergies  Allergen Reactions  . Penicillins Rash    Current Outpatient Medications  Medication Sig Dispense Refill  . carvedilol (COREG) 12.5 MG tablet Take 1 tablet (12.5 mg total) by mouth 2 (two) times daily with a meal. 180 tablet 1  . folic acid (FOLVITE) 1 MG tablet TAKE 1 TABLET(1 MG) BY MOUTH DAILY 30 tablet 11  . lisinopril (ZESTRIL) 10 MG tablet TAKE 1 TABLET(10 MG) BY MOUTH TWICE DAILY 180 tablet 3  . Rivaroxaban (XARELTO) 15 MG TABS tablet Take 1 tablet (15 mg total) by mouth daily with supper. 90 tablet 3  . thiamine 100 MG tablet Take 1 tablet (100 mg total) by mouth daily. 90 tablet 2   No current facility-administered medications for this visit.    Past Medical History:  Diagnosis Date  . A-fib (HCC)   . Anxiety   . CHF (congestive heart failure) (HCC)   . Hypertension     Past Surgical History:  Procedure Laterality Date  . TUBAL LIGATION      Social History   Socioeconomic History  . Marital status: Married    Spouse name: Not on file  . Number of children: Not on file  . Years of education: Not on file  . Highest education level: Not on file  Occupational History  . Not on file  Tobacco Use  . Smoking status: Current Every Day Smoker    Packs/day:  0.50    Types: Cigarettes    Start date: 05/17/1961  . Smokeless tobacco: Never Used  Substance and Sexual Activity  . Alcohol use: No  . Drug use: No  . Sexual activity: Not Currently  Other Topics Concern  . Not on file  Social History Narrative  . Not on file   Social Determinants of Health   Financial Resource Strain:   . Difficulty of Paying Living Expenses:   Food Insecurity:   . Worried About Programme researcher, broadcasting/film/video in the Last Year:   . Barista in the Last Year:   Transportation Needs:   . Freight forwarder (Medical):   Marland Kitchen Lack of Transportation (Non-Medical):   Physical Activity:   . Days of Exercise per Week:   . Minutes of Exercise per Session:   Stress:   . Feeling of Stress :   Social Connections:   . Frequency of Communication with Friends and Family:   . Frequency of Social Gatherings with Friends and Family:   . Attends Religious Services:   . Active Member of Clubs or Organizations:   . Attends Banker Meetings:   Marland Kitchen Marital Status:   Intimate Partner Violence:   . Fear of Current or Ex-Partner:   . Emotionally Abused:   Marland Kitchen Physically Abused:   . Sexually Abused:     Marlyn Corporal, RN was present throughout the entirety of  the encounter.  Vitals:   10/13/19 0917  BP: (!) 185/113  Pulse: (!) 104  SpO2: 97%  Weight: 134 lb (60.8 kg)  Height: 5\' 4"  (1.626 m)    Wt Readings from Last 3 Encounters:  10/13/19 134 lb (60.8 kg)  10/09/18 120 lb (54.4 kg)  10/16/17 127 lb (57.6 kg)     PHYSICAL EXAM General: NAD HEENT: Normal. Neck: No JVD, no thyromegaly. Lungs: Clear to auscultation bilaterally with normal respiratory effort. CV: Mildly tachycardic, irregular rhythm, normal S1/S2, no S3, no murmur. No pretibial or periankle edema.  No carotid bruit.   Abdomen: Soft, nontender, no distention.  Neurologic: Alert and oriented.  Psych: Normal affect. Skin: Normal. Musculoskeletal: No gross  deformities.      Labs: Lab Results  Component Value Date/Time   K 4.9 06/10/2014 08:11 AM   BUN 16 06/10/2014 08:11 AM   CREATININE 0.90 06/10/2014 08:11 AM   ALT 33 05/13/2014 10:52 AM   TSH 3.260 05/11/2014 06:11 PM   HGB 13.5 05/16/2014 03:11 AM     Lipids: No results found for: LDLCALC, LDLDIRECT, CHOL, TRIG, HDL     ASSESSMENT AND PLAN:  1.Permanent atrial fibrillation:Symptomatically stable on carvedilol.  Heart rate is mildly elevated.  I have asked her to monitor her heart rate 3 times a week for the next month. Anticoagulated with Xarelto.  2. Cardiomyopathy: EF now normalized 55-60%. It was thought to be tachycardia mediated. Continue carvedilolandlisinopril. I will increase lisinopril to 20 mg twice daily to control blood pressure.  3. Essential hypertension: She has whitecoat hypertension.  BP is markedly elevated.  I will increase lisinopril to 20 mg twice daily.  I have asked her to monitor her blood pressure 3 times per week for the next month.    Disposition: Follow up 6 months   Kate Sable, M.D., F.A.C.C.

## 2019-10-15 ENCOUNTER — Other Ambulatory Visit: Payer: Self-pay | Admitting: Cardiovascular Disease

## 2019-11-13 ENCOUNTER — Other Ambulatory Visit: Payer: Self-pay | Admitting: Cardiovascular Disease

## 2019-11-28 ENCOUNTER — Telehealth: Payer: Self-pay

## 2019-11-28 MED ORDER — AMLODIPINE BESYLATE 5 MG PO TABS: 5.0000 mg | ORAL_TABLET | Freq: Every day | ORAL | 6 refills | Status: DC

## 2019-11-28 NOTE — Telephone Encounter (Signed)
New message    Patient dropped off list of blood pressure readings    10/16/19 176/97 10/20/19 167/94 10/22/19 156/105 10/28/19 175/122 10/30/19 168/183 11/01/19 140/194  12/05/19 158/108 11/06/19 154/107 11/08/19 173/113 11/11/19 173/113 11/13/19 148/102 11/15/19 170/112 11/19/19 165/111

## 2019-11-28 NOTE — Telephone Encounter (Signed)
BP still too high, can we start norvasc 5mg  daily. Update with bp in 2 weeks   J Desira Alessandrini MD

## 2019-11-28 NOTE — Telephone Encounter (Signed)
Notified, new medication sent to [facility] now.

## 2019-11-28 NOTE — Telephone Encounter (Signed)
BP check in office this morning was 152/90  52  97 %

## 2020-02-06 ENCOUNTER — Encounter: Payer: Self-pay | Admitting: *Deleted

## 2020-02-06 ENCOUNTER — Telehealth: Payer: Self-pay | Admitting: Student

## 2020-02-06 MED ORDER — RIVAROXABAN 15 MG PO TABS: 15.0000 mg | ORAL_TABLET | Freq: Every day | ORAL | 3 refills | Status: DC

## 2020-02-06 NOTE — Telephone Encounter (Signed)
Order placed and labs requested.

## 2020-02-06 NOTE — Telephone Encounter (Signed)
    Can provide refill of Xarelto but the dosing should be for 15mg  daily by review of notes. She is a former [facility] patient and the most recent labs in the system are from 2015. Please request a copy of recent labs from her PCP to include CBC and BMET to make sure this dose is appropriate.   Thanks,  2016, PA-C 02/06/2020, 11:28 AM Pager: [phone]

## 2020-02-06 NOTE — Telephone Encounter (Signed)
New message      *STAT* If patient is at the pharmacy, call can be transferred to refill team.   1. Which medications need to be refilled? (please list name of each medication and dose if known)  xarelto 100 mg   2. Which pharmacy/location (including street and city if local pharmacy) is medication to be sent to? [facility] on [address]   3. Do they need a 30 day or 90 day supply? 90

## 2020-03-16 ENCOUNTER — Ambulatory Visit: Payer: PPO | Admitting: Student

## 2020-03-30 ENCOUNTER — Encounter: Payer: Self-pay | Admitting: Student

## 2020-03-30 ENCOUNTER — Other Ambulatory Visit: Payer: Self-pay

## 2020-03-30 ENCOUNTER — Ambulatory Visit: Payer: PPO | Admitting: Student

## 2020-03-30 VITALS — BP 130/84 | HR 60 | Ht 64.0 in | Wt 135.2 lb

## 2020-03-30 DIAGNOSIS — I4821 Permanent atrial fibrillation: Secondary | ICD-10-CM | POA: Diagnosis not present

## 2020-03-30 DIAGNOSIS — Z79899 Other long term (current) drug therapy: Secondary | ICD-10-CM | POA: Diagnosis not present

## 2020-03-30 DIAGNOSIS — Z8679 Personal history of other diseases of the circulatory system: Secondary | ICD-10-CM | POA: Diagnosis not present

## 2020-03-30 DIAGNOSIS — Z720 Tobacco use: Secondary | ICD-10-CM | POA: Diagnosis not present

## 2020-03-30 DIAGNOSIS — I10 Essential (primary) hypertension: Secondary | ICD-10-CM | POA: Diagnosis not present

## 2020-03-30 MED ORDER — RIVAROXABAN 15 MG PO TABS: 15.0000 mg | ORAL_TABLET | Freq: Every day | ORAL | 3 refills | Status: DC

## 2020-03-30 MED ORDER — CARVEDILOL 12.5 MG PO TABS: 12.5000 mg | ORAL_TABLET | Freq: Two times a day (BID) | ORAL | 3 refills | Status: DC

## 2020-03-30 MED ORDER — AMLODIPINE BESYLATE 5 MG PO TABS: 5.0000 mg | ORAL_TABLET | Freq: Every day | ORAL | 3 refills | Status: DC

## 2020-03-30 MED ORDER — LISINOPRIL 20 MG PO TABS: 20.0000 mg | ORAL_TABLET | Freq: Two times a day (BID) | ORAL | 3 refills | Status: DC

## 2020-03-30 NOTE — Progress Notes (Signed)
Cardiology Office Note    Date:  03/30/2020   ID:  Rebecca Huerta, DOB 12/23/1945, MRN [id number]  PCP:  Delorse LekBurnett, Brent A, MD  Cardiologist: Prentice DockerSuresh Koneswaran, MD (Inactive)  --> Will switch to Dr. Wyline MoodBranch  Chief Complaint  Patient presents with  . Follow-up    Routine visit; Issues with elevated BP earlier this year     History of Present Illness:    Rebecca Huerta is a 74 y.o. female with past medical history of permanent atrial fibrillation, prior cardiomyopathy (EF 25-30% in 2015 and felt to be tachycardia-mediated, normalized to 55-60% by echo in 2016) and HTN who presents to the office today for follow-up of elevated blood pressure.   She was last examined by Dr. Purvis SheffieldKoneswaran in 10/2019 and denied any recent chest pain or palpitations at that time. BP was significantly elevated at 185/113 during her visit and she reported having whitecoat hypertension. Lisinopril was increased to 20 mg twice daily and she was continued on Coreg 12.5 mg twice daily and Xarelto 15mg  daily.   She called the office in 11/2019 reporting elevated BP and it was recommended to start Amlodipine 5 mg daily.  In talking with the patient today, she reports overall doing well from a cardiac perspective since her last visit. 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 She also owns 25 acres of land and takes care of this by herself. She denies any recent chest pain or dyspnea on exertion. No recent orthopnea, PND, lower extremity edema or palpitations. Remains on Xarelto for anticoagulation and denies any recent melena, hematochezia or hematuria.   Past Medical History:  Diagnosis Date  . A-fib (HCC)   . Anxiety   . CHF (congestive heart failure) (HCC)    a.  EF was previously 25-30% in 2015 and felt to be tachycardia-mediated b. EF normalized to 55-60% by echo in 2016  . Hypertension     Past Surgical History:  Procedure Laterality Date  . TUBAL LIGATION       Current Medications: Outpatient Medications Prior to Visit  Medication Sig Dispense Refill  . folic acid (FOLVITE) 1 MG tablet TAKE 1 TABLET(1 MG) BY MOUTH DAILY 30 tablet 11  . thiamine 100 MG tablet Take 1 tablet (100 mg total) by mouth daily. 90 tablet 2  . amLODipine (NORVASC) 5 MG tablet Take 1 tablet (5 mg total) by mouth daily. 30 tablet 6  . carvedilol (COREG) 12.5 MG tablet TAKE 1 TABLET(12.5 MG) BY MOUTH TWICE DAILY WITH A MEAL 180 tablet 1  . lisinopril (ZESTRIL) 20 MG tablet Take 1 tablet (20 mg total) by mouth in the morning and at bedtime. 180 tablet 3  . Rivaroxaban (XARELTO) 15 MG TABS tablet Take 1 tablet (15 mg total) by mouth daily with supper. 90 tablet 3   No facility-administered medications prior to visit.     Allergies:   Penicillins   Social History   Socioeconomic History  . Marital status: Married    Spouse name: Not on file  . Number of children: Not on file  . Years of education: Not on file  . Highest education level: Not on file  Occupational History  . Not on file  Tobacco Use  . Smoking status: Current Every Day Smoker    Packs/day: 0.50    Types: Cigarettes    Start date: 05/17/1961  . Smokeless tobacco: Never Used  Vaping Use  . Vaping Use: Never  used  Substance and Sexual Activity  . Alcohol use: No  . Drug use: No  . Sexual activity: Not Currently  Other Topics Concern  . Not on file  Social History Narrative  . Not on file   Social Determinants of Health   Financial Resource Strain:   . Difficulty of Paying Living Expenses: Not on file  Food Insecurity:   . Worried About Programme researcher, broadcasting/film/video in the Last Year: Not on file  . Ran Out of Food in the Last Year: Not on file  Transportation Needs:   . Lack of Transportation (Medical): Not on file  . Lack of Transportation (Non-Medical): Not on file  Physical Activity:   . Days of Exercise per Week: Not on file  . Minutes of Exercise per Session: Not on file  Stress:   .  Feeling of Stress : Not on file  Social Connections:   . Frequency of Communication with Friends and Family: Not on file  . Frequency of Social Gatherings with Friends and Family: Not on file  . Attends Religious Services: Not on file  . Active Member of Clubs or Organizations: Not on file  . Attends Banker Meetings: Not on file  . Marital Status: Not on file     Family History:  Family history is unknown by patient.   Review of Systems:   Please see the history of present illness.     General:  No chills, fever, night sweats or weight changes.  Cardiovascular:  No chest pain, dyspnea on exertion, edema, orthopnea, palpitations, paroxysmal nocturnal dyspnea. Dermatological: No rash, lesions/masses Respiratory: No cough, dyspnea Urologic: No hematuria, dysuria Abdominal:   No nausea, vomiting, diarrhea, bright red blood per rectum, melena, or hematemesis Neurologic:  No visual changes, wkns, changes in mental status. All other systems reviewed and are otherwise negative except as noted above.   Physical Exam:    VS:  BP 130/84   Pulse 60   Ht 5\' 4"  (1.626 m)   Wt 135 lb 3.2 oz (61.3 kg)   SpO2 99%   BMI 23.21 kg/m    General: Well developed, well nourished,female appearing in no acute distress. Head: Normocephalic, atraumatic. Neck: No carotid bruits. JVD not elevated.  Lungs: Respirations regular and unlabored, without wheezes or rales.  Heart: Irregularly irregular. No S3 or S4.  No murmur, no rubs, or gallops appreciated. Abdomen: Appears non-distended. No obvious abdominal masses. Msk:  Strength and tone appear normal for age. No obvious joint deformities or effusions. Extremities: No clubbing or cyanosis. No lower extremity edema.  Distal pedal pulses are 2+ bilaterally. Neuro: Alert and oriented X 3. Moves all extremities spontaneously. No focal deficits noted. Psych:  Responds to questions appropriately with a normal affect. Skin: No rashes or lesions  noted  Wt Readings from Last 3 Encounters:  03/30/20 135 lb 3.2 oz (61.3 kg)  10/13/19 134 lb (60.8 kg)  10/09/18 120 lb (54.4 kg)     Studies/Labs Reviewed:   EKG:  EKG is not ordered today.   Recent Labs: No results found for requested labs within last 8760 hours.   Lipid Panel No results found for: CHOL, TRIG, HDL, CHOLHDL, VLDL, LDLCALC, LDLDIRECT  Additional studies/ records that were reviewed today include:   Echocardiogram: 04/2015 Study Conclusions   - Left ventricle: The cavity size was normal. Wall thickness was  increased in a pattern of mild LVH. Systolic function was normal.  The estimated ejection fraction was in the  range of 55% to 60%.  Wall motion was normal; there were no regional wall motion  abnormalities.  - Aortic valve: Mildly calcified annulus. Trileaflet; mildly  thickened leaflets. Valve area (VTI): 1.62 cm^2. Valve area  (Vmax): 1.55 cm^2.  - Mitral valve: There was mild regurgitation.  - Left atrium: The atrium was severely dilated.  - Right atrium: The atrium was mildly dilated.  - Atrial septum: No defect or patent foramen ovale was identified.  - Pulmonary arteries: Systolic pressure could not be accurately  estimated. Inadequate TR jet.  - Technically adequate study.  Assessment:    1. Permanent atrial fibrillation (HCC)   2. Medication management   3. History of cardiomyopathy   4. Essential hypertension   5. Tobacco abuse      Plan:   In order of problems listed above:  1. Permanent Atrial Fibrillation - She is in atrial fibrillation by examination today but heart rate is well controlled in the 60's. Remains on Coreg 12.5 mg twice daily for rate control. - This patients CHA2DS2-VASc Score and unadjusted Ischemic Stroke Rate (% per year) is equal to 4.8 % stroke rate/year from a score of 4 (CHF, HTN, Female, Age). She denies any evidence of active bleeding and remains on Xarelto 15 mg daily for anticoagulation. She  has not had repeat labs since 2015 by review of Epic and is unsure when labs were obtained by her PCP. I strongly recommended she have repeat labs, at least including a CBC and BMET given the use of anticoagulation for safety considerations and she will consider having these obtained in the next few weeks.  2. History of NICM - EF was previously 25-30% in 2015 and felt to be tachycardia-mediated, normalized to 55-60% by echo in 2016. She denies any recent dyspnea on exertion, orthopnea, PND or lower extremity edema. - Remains on Coreg 12.5 mg twice daily and Lisinopril 20 mg twice daily.  3. HTN - BP is well controlled at 130/84 which is significantly improved when compared to prior values from earlier this year. She wishes to stop several of her medications but given her well-controlled readings, I encouraged her to continue her medications going forward. Remains on Amlodipine 5 mg daily, Coreg 12.5 mg twice daily and Lisinopril 20 mg twice daily. Will plan for a repeat BMET as outlined above.   4. Tobacco Use - Continues to smoke 0.5 - 1.0 ppd. Not interested in quitting at this time.    Medication Adjustments/Labs and Tests Ordered: Current medicines are reviewed at length with the patient today.  Concerns regarding medicines are outlined above.  Medication changes, Labs and Tests ordered today are listed in the Patient Instructions below. Patient Instructions  Medication Instructions:  Your physician recommends that you continue on your current medications as directed. Please refer to the Current Medication list given to you today.  *If you need a refill on your cardiac medications before your next appointment, please call your pharmacy*   Lab Work: NONE  If you have labs (blood work) drawn today and your tests are completely normal, you will receive your results only by: Marland Kitchen MyChart Message (if you have MyChart) OR . A paper copy in the mail If you have any lab test that is abnormal or  we need to change your treatment, we will call you to review the results.   Testing/Procedures: NONE    Follow-Up: At [facility], you and your health needs are our priority.  As part of our continuing  mission to provide you with exceptional heart care, we have created designated Provider Care Teams.  These Care Teams include your primary Cardiologist (physician) and Advanced Practice Providers (APPs -  Physician Assistants and Nurse Practitioners) who all work together to provide you with the care you need, when you need it.  We recommend signing up for the patient portal called "MyChart".  Sign up information is provided on this After Visit Summary.  MyChart is used to connect with patients for Virtual Visits (Telemedicine).  Patients are able to view lab/test results, encounter notes, upcoming appointments, etc.  Non-urgent messages can be sent to your provider as well.   To learn more about what you can do with MyChart, go to [email or web address].    Your next appointment:   1 year(s)  The format for your next appointment:   In Person  Provider:   Dina Rich, MD   Other Instructions Thank you for choosing [facility]!       Signed, Ellsworth Lennox, PA-C  03/30/2020 5:31 PM    [facility] [address] [place], [place] [address] Phone: [phone] Fax: [phone]

## 2020-03-30 NOTE — Patient Instructions (Signed)
Medication Instructions:  Your physician recommends that you continue on your current medications as directed. Please refer to the Current Medication list given to you today.  *If you need a refill on your cardiac medications before your next appointment, please call your pharmacy*   Lab Work: NONE   If you have labs (blood work) drawn today and your tests are completely normal, you will receive your results only by: . MyChart Message (if you have MyChart) OR . A paper copy in the mail If you have any lab test that is abnormal or we need to change your treatment, we will call you to review the results.   Testing/Procedures: NONE    Follow-Up: At [facility], you and your health needs are our priority.  As part of our continuing mission to provide you with exceptional heart care, we have created designated Provider Care Teams.  These Care Teams include your primary Cardiologist (physician) and Advanced Practice Providers (APPs -  Physician Assistants and Nurse Practitioners) who all work together to provide you with the care you need, when you need it.  We recommend signing up for the patient portal called "MyChart".  Sign up information is provided on this After Visit Summary.  MyChart is used to connect with patients for Virtual Visits (Telemedicine).  Patients are able to view lab/test results, encounter notes, upcoming appointments, etc.  Non-urgent messages can be sent to your provider as well.   To learn more about what you can do with MyChart, go to [email or web address].    Your next appointment:   1 year(s)  The format for your next appointment:   In Person  Provider:   Jonathan Branch, MD   Other Instructions Thank you for choosing [facility]!    

## 2020-04-06 ENCOUNTER — Encounter (HOSPITAL_COMMUNITY): Payer: Self-pay

## 2020-04-06 ENCOUNTER — Other Ambulatory Visit: Payer: Self-pay

## 2020-04-06 ENCOUNTER — Emergency Department (HOSPITAL_COMMUNITY): Payer: PPO

## 2020-04-06 ENCOUNTER — Observation Stay (HOSPITAL_COMMUNITY)
Admission: EM | Admit: 2020-04-06 | Discharge: 2020-04-07 | Disposition: A | Discharge status: Home / Self Care | Payer: PPO | Attending: Family Medicine | Admitting: Family Medicine

## 2020-04-06 DIAGNOSIS — I10 Essential (primary) hypertension: Secondary | ICD-10-CM | POA: Diagnosis not present

## 2020-04-06 DIAGNOSIS — Z7901 Long term (current) use of anticoagulants: Secondary | ICD-10-CM | POA: Diagnosis not present

## 2020-04-06 DIAGNOSIS — D72829 Elevated white blood cell count, unspecified: Secondary | ICD-10-CM

## 2020-04-06 DIAGNOSIS — Y92410 Unspecified street and highway as the place of occurrence of the external cause: Secondary | ICD-10-CM | POA: Insufficient documentation

## 2020-04-06 DIAGNOSIS — S0101XA Laceration without foreign body of scalp, initial encounter: Secondary | ICD-10-CM | POA: Diagnosis not present

## 2020-04-06 DIAGNOSIS — Z8679 Personal history of other diseases of the circulatory system: Secondary | ICD-10-CM

## 2020-04-06 DIAGNOSIS — F17210 Nicotine dependence, cigarettes, uncomplicated: Secondary | ICD-10-CM | POA: Diagnosis not present

## 2020-04-06 DIAGNOSIS — Z20822 Contact with and (suspected) exposure to covid-19: Secondary | ICD-10-CM | POA: Diagnosis not present

## 2020-04-06 DIAGNOSIS — R58 Hemorrhage, not elsewhere classified: Secondary | ICD-10-CM | POA: Diagnosis not present

## 2020-04-06 DIAGNOSIS — S32001A Stable burst fracture of unspecified lumbar vertebra, initial encounter for closed fracture: Secondary | ICD-10-CM | POA: Diagnosis present

## 2020-04-06 DIAGNOSIS — Z720 Tobacco use: Secondary | ICD-10-CM | POA: Diagnosis present

## 2020-04-06 DIAGNOSIS — I509 Heart failure, unspecified: Secondary | ICD-10-CM | POA: Insufficient documentation

## 2020-04-06 DIAGNOSIS — I517 Cardiomegaly: Secondary | ICD-10-CM | POA: Diagnosis not present

## 2020-04-06 DIAGNOSIS — I4891 Unspecified atrial fibrillation: Secondary | ICD-10-CM | POA: Diagnosis present

## 2020-04-06 DIAGNOSIS — S32011A Stable burst fracture of first lumbar vertebra, initial encounter for closed fracture: Principal | ICD-10-CM | POA: Insufficient documentation

## 2020-04-06 DIAGNOSIS — Z041 Encounter for examination and observation following transport accident: Secondary | ICD-10-CM | POA: Diagnosis not present

## 2020-04-06 DIAGNOSIS — I110 Hypertensive heart disease with heart failure: Secondary | ICD-10-CM | POA: Diagnosis not present

## 2020-04-06 DIAGNOSIS — S199XXA Unspecified injury of neck, initial encounter: Secondary | ICD-10-CM | POA: Diagnosis not present

## 2020-04-06 DIAGNOSIS — S0990XA Unspecified injury of head, initial encounter: Secondary | ICD-10-CM | POA: Diagnosis present

## 2020-04-06 DIAGNOSIS — R52 Pain, unspecified: Secondary | ICD-10-CM | POA: Diagnosis not present

## 2020-04-06 DIAGNOSIS — I4821 Permanent atrial fibrillation: Secondary | ICD-10-CM | POA: Diagnosis not present

## 2020-04-06 DIAGNOSIS — M549 Dorsalgia, unspecified: Secondary | ICD-10-CM | POA: Diagnosis not present

## 2020-04-06 DIAGNOSIS — M545 Low back pain, unspecified: Secondary | ICD-10-CM | POA: Diagnosis not present

## 2020-04-06 DIAGNOSIS — M5489 Other dorsalgia: Secondary | ICD-10-CM | POA: Diagnosis not present

## 2020-04-06 LAB — CBC
HCT: 46.2 % — ABNORMAL HIGH (ref 36.0–46.0)
Hemoglobin: 15.5 g/dL — ABNORMAL HIGH (ref 12.0–15.0)
MCH: 31.2 pg (ref 26.0–34.0)
MCHC: 33.5 g/dL (ref 30.0–36.0)
MCV: 93 fL (ref 80.0–100.0)
Platelets: 227 10*3/uL (ref 150–400)
RBC: 4.97 MIL/uL (ref 3.87–5.11)
RDW: 13.8 % (ref 11.5–15.5)
WBC: 14.7 10*3/uL — ABNORMAL HIGH (ref 4.0–10.5)
nRBC: 0 % (ref 0.0–0.2)

## 2020-04-06 LAB — COMPREHENSIVE METABOLIC PANEL
ALT: 21 U/L (ref 0–44)
AST: 26 U/L (ref 15–41)
Albumin: 3.9 g/dL (ref 3.5–5.0)
Alkaline Phosphatase: 75 U/L (ref 38–126)
Anion gap: 7 (ref 5–15)
BUN: 16 mg/dL (ref 8–23)
CO2: 26 mmol/L (ref 22–32)
Calcium: 9.3 mg/dL (ref 8.9–10.3)
Chloride: 105 mmol/L (ref 98–111)
Creatinine, Ser: 1.03 mg/dL — ABNORMAL HIGH (ref 0.44–1.00)
GFR, Estimated: 57 mL/min — ABNORMAL LOW (ref >60)
Glucose, Bld: 132 mg/dL — ABNORMAL HIGH (ref 70–99)
Potassium: 3.9 mmol/L (ref 3.5–5.1)
Sodium: 138 mmol/L (ref 135–145)
Total Bilirubin: 0.5 mg/dL (ref 0.3–1.2)
Total Protein: 8 g/dL (ref 6.5–8.1)

## 2020-04-06 MED ORDER — CARVEDILOL 12.5 MG PO TABS
12.5000 mg | ORAL_TABLET | Freq: Once | ORAL | Status: AC
  Administered 2020-04-06: 12.5 mg via ORAL
  Filled 2020-04-06: qty 1

## 2020-04-06 MED ORDER — IOHEXOL 300 MG/ML  SOLN
100.0000 mL | Freq: Once | INTRAMUSCULAR | Status: AC | PRN
  Administered 2020-04-06: 100 mL via INTRAVENOUS

## 2020-04-06 MED ORDER — LIDOCAINE-EPINEPHRINE (PF) 1 %-1:200000 IJ SOLN
10.0000 mL | Freq: Once | INTRAMUSCULAR | Status: AC
  Administered 2020-04-06: 10 mL
  Filled 2020-04-06: qty 30

## 2020-04-06 MED ORDER — SODIUM CHLORIDE 0.9 % IV BOLUS
1000.0000 mL | Freq: Once | INTRAVENOUS | Status: AC
  Administered 2020-04-06: 1000 mL via INTRAVENOUS

## 2020-04-06 MED ORDER — NICOTINE 14 MG/24HR TD PT24
14.0000 mg | MEDICATED_PATCH | Freq: Once | TRANSDERMAL | Status: DC
  Administered 2020-04-06: 14 mg via TRANSDERMAL
  Filled 2020-04-06: qty 1
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
## 2020-04-06 NOTE — Progress Notes (Signed)
Orthopedic Tech Progress Note Patient Details:  Rebecca Huerta 03-10-1946 [id number] Called in TLSO brace to be delivered Patient ID: [name], female   DOB: 10-21-1945, 74 y.o.   MRN: [id number]   Gerald Stabs 04/06/2020, 11:42 PM

## 2020-04-07 ENCOUNTER — Encounter (HOSPITAL_COMMUNITY): Payer: Self-pay | Admitting: Internal Medicine

## 2020-04-07 ENCOUNTER — Other Ambulatory Visit: Payer: Self-pay

## 2020-04-07 DIAGNOSIS — R2689 Other abnormalities of gait and mobility: Secondary | ICD-10-CM | POA: Diagnosis not present

## 2020-04-07 DIAGNOSIS — S32001D Stable burst fracture of unspecified lumbar vertebra, subsequent encounter for fracture with routine healing: Secondary | ICD-10-CM

## 2020-04-07 DIAGNOSIS — S0101XD Laceration without foreign body of scalp, subsequent encounter: Secondary | ICD-10-CM

## 2020-04-07 DIAGNOSIS — D72829 Elevated white blood cell count, unspecified: Secondary | ICD-10-CM

## 2020-04-07 DIAGNOSIS — Z720 Tobacco use: Secondary | ICD-10-CM

## 2020-04-07 DIAGNOSIS — I10 Essential (primary) hypertension: Secondary | ICD-10-CM | POA: Diagnosis not present

## 2020-04-07 DIAGNOSIS — E559 Vitamin D deficiency, unspecified: Secondary | ICD-10-CM | POA: Diagnosis not present

## 2020-04-07 DIAGNOSIS — Z8679 Personal history of other diseases of the circulatory system: Secondary | ICD-10-CM | POA: Diagnosis not present

## 2020-04-07 DIAGNOSIS — I4891 Unspecified atrial fibrillation: Secondary | ICD-10-CM | POA: Diagnosis not present

## 2020-04-07 DIAGNOSIS — S0101XA Laceration without foreign body of scalp, initial encounter: Secondary | ICD-10-CM

## 2020-04-07 DIAGNOSIS — I4821 Permanent atrial fibrillation: Secondary | ICD-10-CM

## 2020-04-07 DIAGNOSIS — M6281 Muscle weakness (generalized): Secondary | ICD-10-CM | POA: Diagnosis not present

## 2020-04-07 DIAGNOSIS — S32011A Stable burst fracture of first lumbar vertebra, initial encounter for closed fracture: Secondary | ICD-10-CM | POA: Diagnosis not present

## 2020-04-07 LAB — COMPREHENSIVE METABOLIC PANEL
ALT: 17 U/L (ref 0–44)
AST: 20 U/L (ref 15–41)
Albumin: 3.2 g/dL — ABNORMAL LOW (ref 3.5–5.0)
Alkaline Phosphatase: 59 U/L (ref 38–126)
Anion gap: 7 (ref 5–15)
BUN: 12 mg/dL (ref 8–23)
CO2: 24 mmol/L (ref 22–32)
Calcium: 8.3 mg/dL — ABNORMAL LOW (ref 8.9–10.3)
Chloride: 106 mmol/L (ref 98–111)
Creatinine, Ser: 0.96 mg/dL (ref 0.44–1.00)
GFR, Estimated: >60 mL/min (ref >60)
Glucose, Bld: 113 mg/dL — ABNORMAL HIGH (ref 70–99)
Potassium: 3.4 mmol/L — ABNORMAL LOW (ref 3.5–5.1)
Sodium: 137 mmol/L (ref 135–145)
Total Bilirubin: 1 mg/dL (ref 0.3–1.2)
Total Protein: 6.6 g/dL (ref 6.5–8.1)

## 2020-04-07 LAB — RESPIRATORY PANEL BY RT PCR (FLU A&B, COVID)
Influenza A by PCR: NEGATIVE
Influenza B by PCR: NEGATIVE
SARS Coronavirus 2 by RT PCR: NEGATIVE

## 2020-04-07 LAB — CBC
HCT: 39.2 % (ref 36.0–46.0)
Hemoglobin: 13.2 g/dL (ref 12.0–15.0)
MCH: 31.2 pg (ref 26.0–34.0)
MCHC: 33.7 g/dL (ref 30.0–36.0)
MCV: 92.7 fL (ref 80.0–100.0)
Platelets: 166 10*3/uL (ref 150–400)
RBC: 4.23 MIL/uL (ref 3.87–5.11)
RDW: 13.9 % (ref 11.5–15.5)
WBC: 10.2 10*3/uL (ref 4.0–10.5)
nRBC: 0 % (ref 0.0–0.2)

## 2020-04-07 LAB — PROTIME-INR
INR: 1.4 — ABNORMAL HIGH (ref 0.8–1.2)
Prothrombin Time: 17 seconds — ABNORMAL HIGH (ref 11.4–15.2)

## 2020-04-07 LAB — APTT: aPTT: 37 seconds — ABNORMAL HIGH (ref 24–36)

## 2020-04-07 LAB — MAGNESIUM: Magnesium: 1.9 mg/dL (ref 1.7–2.4)

## 2020-04-07 LAB — PHOSPHORUS: Phosphorus: 2.7 mg/dL (ref 2.5–4.6)

## 2020-04-07 MED ORDER — POTASSIUM CHLORIDE CRYS ER 20 MEQ PO TBCR
40.0000 meq | EXTENDED_RELEASE_TABLET | Freq: Once | ORAL | Status: AC
  Administered 2020-04-07: 40 meq via ORAL
  Filled 2020-04-07: qty 2

## 2020-04-07 MED ORDER — ACETAMINOPHEN 325 MG PO TABS: 650.0000 mg | ORAL_TABLET | Freq: Four times a day (QID) | ORAL | Status: AC

## 2020-04-07 MED ORDER — THIAMINE HCL 100 MG PO TABS
100.0000 mg | ORAL_TABLET | Freq: Every day | ORAL | Status: DC
  Administered 2020-04-07: 100 mg via ORAL
  Filled 2020-04-07: qty 1

## 2020-04-07 MED ORDER — FOLIC ACID 1 MG PO TABS
1.0000 mg | ORAL_TABLET | Freq: Every day | ORAL | Status: DC
  Administered 2020-04-07: 1 mg via ORAL
  Filled 2020-04-07: qty 1

## 2020-04-07 MED ORDER — LISINOPRIL 10 MG PO TABS
20.0000 mg | ORAL_TABLET | Freq: Every day | ORAL | Status: DC
  Administered 2020-04-07: 20 mg via ORAL
  Filled 2020-04-07: qty 2

## 2020-04-07 MED ORDER — AMLODIPINE BESYLATE 5 MG PO TABS
5.0000 mg | ORAL_TABLET | Freq: Every day | ORAL | Status: DC
  Administered 2020-04-07: 5 mg via ORAL
  Filled 2020-04-07: qty 1

## 2020-04-07 MED ORDER — RIVAROXABAN 15 MG PO TABS
15.0000 mg | ORAL_TABLET | Freq: Every day | ORAL | 3 refills | Status: DC
Start: 2020-04-12 — End: 2021-01-19

## 2020-04-07 MED ORDER — CARVEDILOL 12.5 MG PO TABS
12.5000 mg | ORAL_TABLET | Freq: Two times a day (BID) | ORAL | Status: DC
  Administered 2020-04-07: 12.5 mg via ORAL
  Filled 2020-04-07: qty 1

## 2020-04-07 MED ORDER — OXYCODONE HCL 5 MG PO TABS
5.0000 mg | ORAL_TABLET | ORAL | Status: DC | PRN
  Administered 2020-04-07: 5 mg via ORAL
  Filled 2020-04-07: qty 1

## 2020-04-07 MED ORDER — OXYCODONE HCL 5 MG PO TABS: 5.0000 mg | ORAL_TABLET | Freq: Four times a day (QID) | ORAL | 0 refills | Status: AC | PRN

## 2020-04-07 MED ORDER — ADULT MULTIVITAMIN W/MINERALS CH
1.0000 | ORAL_TABLET | Freq: Every day | ORAL | Status: DC
  Administered 2020-04-07: 1 via ORAL
  Filled 2020-04-07: qty 1

## 2020-04-07 NOTE — Progress Notes (Signed)
Patient refusing lab draw this AM.  Explained to patient the importance of labs and patient still refusing stating that she is going home this morning and she doesn't need them.  On-call MD notified.  Will continue to monitor.

## 2020-04-07 NOTE — TOC Transition Note (Signed)
Transition of Care [facility]) - CM/SW Discharge Note   Patient Details  Name: Rebecca Huerta MRN: [id number] Date of Birth: 06/22/1945  Transition of [facility] CM/SW Contact:  Karn Cassis, LCSW Phone Number: 04/07/2020, 3:34 PM   Clinical Narrative:  Pt accepts bed offer at [facility]. Auth received: [id number]. Approved for 7 days. Daughter, facility, RN, and MD notified. Pt will d/c today to SNF. Pt's daughter to provide transport. COVID negative 04/06/20. RN given number to call report.      Final next level of care: Skilled Nursing Facility Barriers to Discharge: Barriers Resolved   Patient Goals and CMS Choice Patient states their goals for this hospitalization and ongoing recovery are:: short term SNF   Choice offered to / list presented to : Adult Children  Discharge Placement PASRR number recieved: 04/07/20            Patient chooses bed at: [facility] Patient to be transferred to facility by: daughter Name of family member notified: Ferd Glassing- daughter Patient and family notified of of transfer: 04/07/20  Discharge Plan and Services In-house Referral: Clinical Social Work   Post Acute Care Choice: Skilled Nursing Facility          DME Arranged: N/A         HH Arranged: NA          Social Determinants of Health (SDOH) Interventions     Readmission Risk Interventions No flowsheet data found.
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
## 2020-04-07 NOTE — Care Management Obs Status (Signed)
MEDICARE OBSERVATION STATUS NOTIFICATION   Patient Details  Name: Rebecca Huerta MRN: [id number] Date of Birth: [birth year]   Medicare Observation Status Notification Given:  Yes Elease Hashimoto, RN agreed to deliver letter)    Corey Harold 04/07/2020, 2:57 PM

## 2020-04-07 NOTE — Plan of Care (Signed)
  Problem: Acute Rehab PT Goals(only PT should resolve) Goal: Pt will Roll Supine to Side Outcome: Progressing Flowsheets (Taken 04/07/2020 0922) Pt will Roll Supine to Side: with min assist Goal: Pt Will Go Supine/Side To Sit Outcome: Progressing Flowsheets (Taken 04/07/2020 0922) Pt will go Supine/Side to Sit: with minimal assist Goal: Pt Will Go Sit To Supine/Side Outcome: Progressing Flowsheets (Taken 04/07/2020 0922) Pt will go Sit to Supine/Side: with minimal assist Goal: Patient Will Transfer Sit To/From Stand Outcome: Progressing Flowsheets (Taken 04/07/2020 5456) Patient will transfer sit to/from stand: with min guard assist Goal: Pt Will Transfer Bed To Chair/Chair To Bed Outcome: Progressing Flowsheets (Taken 04/07/2020 0922) Pt will Transfer Bed to Chair/Chair to Bed: min guard assist Goal: Pt Will Ambulate Outcome: Progressing Flowsheets (Taken 04/07/2020 0922) Pt will Ambulate:  75 feet  with min guard assist  with rolling walker   9:23 AM, 04/07/20 Ocie Bob, MPT Physical Therapist with [facility] [phone] office [phone] mobile phone

## 2020-04-07 NOTE — Discharge Instructions (Signed)
PLEASE REMOVE STAPLES AFTER 10 DAYS.   PLEASE MAKE APPOINTMENT WITH [facility] FOR FOLLOW UP IN 1-2 WEEKS.   PLEASE WEAR TLSO BRACE AT ALL TIMES OR AS MUCH AS POSSIBLE   FALL PRECAUTIONS RECOMMENDED    How to Use a Back Brace  A back brace is a form-fitting device that wraps around your trunk to support your lower back, abdomen, and hips. You may need to wear a back brace to relieve back pain or to correct a medical condition related to the back, such as abnormal curvature of the spine (scoliosis). A back brace can maintain or correct the shape of the spine and prevent a spinal problem from getting worse. A back brace can also take pressure off the layers of tissue (disks) between the bones of the spine (vertebrae). You may need a back brace to keep your back and spine in place while you heal from an injury or recover from surgery. Back braces can be either plastic (rigid brace) or soft elastic (dynamic brace).  A rigid brace usually covers both the front and back of the entire upper body.  A soft brace may cover only the lower back and abdomen and may fasten with self-adhesive elastic straps. Your health care provider will recommend the proper brace for your needs and medical condition. What are the risks?  A back brace may not help if you do not wear it as directed by your health care provider. Be sure to wear the brace exactly as instructed in order to prevent further back problems.  Wearing the brace may be uncomfortable at first. You may have trouble sleeping with it on. It may also be hard for you to do certain activities while wearing it.  If the back brace is not worn as instructed, it may cause rubbing and pressure on your skin and may cause sores. How to use a back brace Different types of braces will have different instructions for use. Follow instructions from your health care provider about:  How to put on the brace.  When and how often to wear the brace. In some  cases, braces may need to be worn for long stretches of time. For example, a brace may need to be worn for 16-23 hours a day when used for scoliosis.  How to take off the brace.  Any safety tips you should follow when wearing the brace. This may include: ? Move carefully while wearing the brace. The brace restricts your movement and could lead to additional injuries. ? Use a cane or walker for support if you feel unsteady. ? Sit in high, firm chairs. It may be difficult to stand up from low, soft chairs. ? Check the skin around the brace every day. Tell your health care provider about any concerns. How to care for a back brace  Do not let the back brace get wet. Typically, you will remove the brace for bathing and then put it back on afterward.  If you have a rigid brace, be sure to store it in a safe place when you are not wearing it. This will help to prevent damage.  Clean or wash the back brace with mild soap and water as told by your health care provider. Contact a health care provider if:  Your brace gets damaged.  You have pain or discomfort when wearing the back brace.  Your back pain is getting worse or is not improving over time.  You notice redness or skin breakdown from wearing  the brace. Summary  You may need to wear a back brace to relieve back pain or to correct a medical condition related to the back, such as abnormal curvature of the spine (scoliosis).  Follow instructions as told by your health care provider about how to use the brace and how to take care of it.  A back brace may not help if you do not wear it as directed by your health care provider. Wear the brace exactly as instructed in order to prevent further back problems.  Move carefully while wearing the brace.  Contact a health care provider if you have pain or discomfort when wearing the back brace or your back pain is getting worse or is not improving over time. This information is not intended to  replace advice given to you by your health care provider. Make sure you discuss any questions you have with your health care provider. Document Revised: 04/24/2018 Document Reviewed: 04/24/2018 Elsevier Patient Education  2020 [employer]      Lumbar Spine Fracture A lumbar spine fracture is a break in one of the bones of the lower back. Lumbar spine fractures can vary from mild to severe. The most severe types are those that:  Cause the broken bones to move out of place (unstable).  Injure or press on the spinal cord. During recovery, it is normal to have pain and stiffness in the lower back for weeks. What are the causes? This condition may be caused by:  A fall.  A car accident.  A gunshot wound.  A hard, direct hit to the back. What increases the risk? You are more likely to develop this condition if:  You are in a situation that could result in a fall or other violent injury.  You have a condition that causes weakness in the bones (osteoporosis). What are the signs or symptoms? The main symptom of this condition is severe pain in the lower back. If a fracture is complex or severe, there may also be:  A misshapen or swollen area on the lower back.  Limited ability to move an area of the lower back.  Inability to empty the bladder (urinary retention).  Loss of bowel or bladder control (incontinence).  Loss of strength or sensation in the legs, feet, and toes.  Inability to move (paralysis). How is this diagnosed? This condition is diagnosed based on:  A physical exam.  Symptoms and what happened just before they developed.  The results of imaging tests, such as an X-ray, CT scan, or MRI. If your nerves have been damaged, you may also have other tests to find out the extent of the damage. How is this treated? Treatment for this condition depends on how severe the injury is. Most fractures can be treated with:  A back brace.  Bed rest and activity  restrictions.  Pain medicine.  Physical therapy. Fractures that are complex, involve multiple bones, or make the spine unstable may require surgery. Surgery is done:  To remove pressure from the nerves or spinal cord.  To stabilize the broken pieces of bone. Follow these instructions at home: Medicines  Take over-the-counter and prescription medicines only as told by your health care provider.  Do not drive or use heavy machinery while taking prescription pain medicine.  If you are taking prescription pain medicine, take actions to prevent or treat constipation. Your health care provider may recommend that you: ? Drink enough fluid to keep your urine pale yellow. ? Eat foods that  are high in fiber, such as fresh fruits and vegetables, whole grains, and beans. ? Limit foods that are high in fat and processed sugars, such as fried or sweet foods. ? Take an over-the-counter or prescription medicine for constipation. If you have a brace:  Wear the back brace as told by your health care provider. Remove it only as told by your health care provider.  Keep the brace clean.  If the brace is not waterproof: ? Do not let it get wet. ? Cover it with a watertight covering when you take a bath or a shower. Activity  Stay in bed (on bed rest) only as directed by your health care provider.  Do exercises to improve motion and strength in your back (physical therapy), if your health care provider tells you to do so.  Return to your normal activities as directed by your health care provider. Ask your health care provider what activities are safe for you. Managing pain, stiffness, and swelling   If directed, put ice on the injured area: ? If you have a removable brace, remove it as told by your health care provider. ? Put ice in a plastic bag. ? Place a towel between your skin and the bag. ? Leave the ice on for 20 minutes, 2-3 times a day. General instructions  Do not use any products  that contain nicotine or tobacco, such as cigarettes and e-cigarettes. These can delay healing after injury. If you need help quitting, ask your health care provider.  Do not drink alcohol. Alcohol can interfere with your treatment.  Keep all follow-up visits as directed by your health care provider. This is important. ? Failing to follow up as recommended could result in permanent injury, disability, or long-lasting (chronic) pain. Contact a health care provider if:  You have a fever.  Your pain medicine is not helping.  Your pain does not get better over time.  You cannot return to your normal activities as planned or expected. Get help right away if:  You have difficulty breathing.  Your pain is very bad and it suddenly gets worse.  You have numbness, tingling, or weakness in any part of your body.  You are unable to empty your bladder.  You cannot control your bladder or bowels.  You are unable to move any body part (paralysis) that is below the level of your injury.  You vomit.  You have pain in your abdomen. Summary  A lumbar spine fracture is a break in one of the bones of the lower back.  The main symptom of this condition is severe pain in the lower back. If a fracture is complex, there may also be numbness, tingling, or paralysis in the legs.  Treatment depends on how severe the injury is. Most fractures can be treated with a back brace, bed rest and activity restrictions, pain medicine, and physical therapy.  Fractures that are complex, involve multiple bones, or make the spine unstable may require surgery. This information is not intended to replace advice given to you by your health care provider. Make sure you discuss any questions you have with your health care provider. Document Revised: 07/14/2017 Document Reviewed: 07/14/2017 Elsevier Patient Education  2020 [employer]   IMPORTANT INFORMATION: PAY CLOSE ATTENTION   PHYSICIAN DISCHARGE  INSTRUCTIONS  Follow with Primary care provider  Delorse Lek, MD  and other consultants as instructed by your Hospitalist Physician  SEEK MEDICAL CARE OR RETURN TO EMERGENCY ROOM IF SYMPTOMS COME BACK,  WORSEN OR NEW PROBLEM DEVELOPS   Please note: You were cared for by a hospitalist during your hospital stay. Every effort will be made to forward records to your primary care provider.  You can request that your primary care provider send for your hospital records if they have not received them.  Once you are discharged, your primary care physician will handle any further medical issues. Please note that NO REFILLS for any discharge medications will be authorized once you are discharged, as it is imperative that you return to your primary care physician (or establish a relationship with a primary care physician if you do not have one) for your post hospital discharge needs so that they can reassess your need for medications and monitor your lab values.  Please get a complete blood count and chemistry panel checked by your Primary MD at your next visit, and again as instructed by your Primary MD.  Get Medicines reviewed and adjusted: Please take all your medications with you for your next visit with your Primary MD  Laboratory/radiological data: Please request your Primary MD to go over all hospital tests and procedure/radiological results at the follow up, please ask your primary care provider to get all Hospital records sent to his/her office.  In some cases, they will be blood work, cultures and biopsy results pending at the time of your discharge. Please request that your primary care provider follow up on these results.  If you are diabetic, please bring your blood sugar readings with you to your follow up appointment with primary care.    Please call and make your follow up appointments as soon as possible.    Also Note the following: If you experience worsening of your admission  symptoms, develop shortness of breath, life threatening emergency, suicidal or homicidal thoughts you must seek medical attention immediately by calling 911 or calling your MD immediately  if symptoms less severe.  You must read complete instructions/literature along with all the possible adverse reactions/side effects for all the Medicines you take and that have been prescribed to you. Take any new Medicines after you have completely understood and accpet all the possible adverse reactions/side effects.   Do not drive when taking Pain medications or sleeping medications (Benzodiazepines)  Do not take more than prescribed Pain, Sleep and Anxiety Medications. It is not advisable to combine anxiety,sleep and pain medications without talking with your primary care practitioner  Special Instructions: If you have smoked or chewed Tobacco  in the last 2 yrs please stop smoking, stop any regular Alcohol  and or any Recreational drug use.  Wear Seat belts while driving.  Do not drive if taking any narcotic, mind altering or controlled substances or recreational drugs or alcohol.
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
## 2020-04-07 NOTE — NC FL2 (Signed)
[place] MEDICAID FL2 LEVEL OF CARE SCREENING TOOL     IDENTIFICATION  Patient Name: Rebecca Huerta Birthdate: 1946/02/11 Sex: female Admission Date (Current Location): 04/06/2020  [facility] and [place] Number:  [employer] and Address:  [facility],  [address] [place] [address]      Provider Number: [id number]  Attending Physician Name and Address:  Cleora Fleet, MD  Relative Name and Phone Number:       Current Level of Care: Hospital Recommended Level of Care: Skilled Nursing Facility Prior Approval Number:    Date Approved/Denied:   PASRR Number: [id number]  Discharge Plan: SNF    Current Diagnoses: Patient Active Problem List   Diagnosis Date Noted  . Scalp laceration 04/07/2020  . Leukocytosis 04/07/2020  . History of CHF (congestive heart failure) 04/07/2020  . Lumbar burst fracture (HCC) 04/06/2020  . Cardiomyopathy (HCC) 06/09/2014  . Chronic systolic heart failure (HCC) 06/09/2014  . Malnutrition (HCC) 06/09/2014  . Rash 06/09/2014  . Anticoagulation adequate 06/09/2014  . Pulmonary hypertension (HCC)   . Acute on chronic systolic CHF (congestive heart failure) (HCC) 05/12/2014  . Benign essential HTN 05/12/2014  . Anxiety state 05/12/2014  . Atrial fibrillation with RVR (HCC) 05/12/2014  . Postoperative pulmonary edema (HCC) 05/12/2014  . Pleural effusion 05/12/2014  . Hypokalemia 05/12/2014  . Hyponatremia 05/12/2014  . A-fib (HCC) 05/11/2014  . Edema leg 05/11/2014  . Pulmonary edema 05/11/2014  . Tobacco abuse 05/11/2014  . Alcohol abuse 05/11/2014    Orientation RESPIRATION BLADDER Height & Weight     Self, Time, Situation, Place  Normal Continent Weight: 130 lb (59 kg) Height:  5\' 4"  (162.6 cm)  BEHAVIORAL SYMPTOMS/MOOD NEUROLOGICAL BOWEL NUTRITION STATUS      Continent Diet (Heart healthy. See d/c summary for updates.)  AMBULATORY STATUS COMMUNICATION OF NEEDS Skin   Limited Assist Verbally Skin  abrasions, Other (Comment) (laceration to left side of head)                       Personal Care Assistance Level of Assistance  Bathing, Dressing, Feeding Bathing Assistance: Maximum assistance Feeding assistance: Limited assistance Dressing Assistance: Maximum assistance     Functional Limitations Info  Sight, Speech, Hearing Sight Info: Adequate Hearing Info: Adequate Speech Info: Adequate    SPECIAL CARE FACTORS FREQUENCY  PT (By licensed PT), OT (By licensed OT)     PT Frequency: 5x weekly OT Frequency: 5x weekly            Contractures Contractures Info: Not present    Additional Factors Info  Allergies, Code Status Code Status Info: Full code Allergies Info: Penicillins           Current Medications (04/07/2020):  This is the current hospital active medication list Current Facility-Administered Medications  Medication Dose Route Frequency Provider Last Rate Last Admin  . amLODipine (NORVASC) tablet 5 mg  5 mg Oral Daily Adefeso, Oladapo, DO   5 mg at 04/07/20 0842  . carvedilol (COREG) tablet 12.5 mg  12.5 mg Oral BID WC Adefeso, Oladapo, DO   12.5 mg at 04/07/20 0842  . folic acid (FOLVITE) tablet 1 mg  1 mg Oral Daily Adefeso, Oladapo, DO   1 mg at 04/07/20 0842  . lisinopril (ZESTRIL) tablet 20 mg  20 mg Oral Daily Adefeso, Oladapo, DO   20 mg at 04/07/20 0841  . multivitamin with minerals tablet 1 tablet  1 tablet Oral Daily 04/09/20,  DO   1 tablet at 04/07/20 0842  . nicotine (NICODERM CQ - dosed in mg/24 hours) patch 14 mg  14 mg Transdermal Once Sabas Sous, MD   14 mg at 04/06/20 2316  . oxyCODONE (Oxy IR/ROXICODONE) immediate release tablet 5 mg  5 mg Oral Q4H PRN Adefeso, Oladapo, DO      . thiamine tablet 100 mg  100 mg Oral Daily Adefeso, Oladapo, DO   100 mg at 04/07/20 1700     Discharge Medications: Please see discharge summary for a list of discharge medications.  Relevant Imaging Results:  Relevant Lab  Results:   Additional Information SSN: [id number]. Pt has not had COVID vaccines.  Karn Cassis, LCSW
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
## 2020-04-22 ENCOUNTER — Encounter: Payer: Self-pay | Admitting: Orthopaedic Surgery

## 2020-04-22 ENCOUNTER — Ambulatory Visit (INDEPENDENT_AMBULATORY_CARE_PROVIDER_SITE_OTHER): Payer: PPO | Admitting: Orthopaedic Surgery

## 2020-04-22 ENCOUNTER — Other Ambulatory Visit: Payer: Self-pay

## 2020-04-22 VITALS — Ht 64.0 in | Wt 135.0 lb

## 2020-04-22 DIAGNOSIS — S32001D Stable burst fracture of unspecified lumbar vertebra, subsequent encounter for fracture with routine healing: Secondary | ICD-10-CM
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
## 2020-05-13 ENCOUNTER — Ambulatory Visit: Payer: PPO | Admitting: Orthopaedic Surgery

## 2020-05-18 ENCOUNTER — Other Ambulatory Visit: Payer: Self-pay

## 2020-05-18 MED ORDER — CARVEDILOL 12.5 MG PO TABS
12.5000 mg | ORAL_TABLET | Freq: Two times a day (BID) | ORAL | 3 refills | Status: DC
Start: 2020-05-18 — End: 2021-01-05

## 2020-05-18 NOTE — Telephone Encounter (Signed)
Refilled coreg 12.5 mg bid

## 2020-06-15 ENCOUNTER — Other Ambulatory Visit: Payer: Self-pay | Admitting: Student

## 2020-06-15 ENCOUNTER — Other Ambulatory Visit: Payer: Self-pay | Admitting: Cardiology

## 2020-06-15 MED ORDER — AMLODIPINE BESYLATE 5 MG PO TABS
5.0000 mg | ORAL_TABLET | Freq: Every day | ORAL | 3 refills | Status: DC
Start: 2020-06-15 — End: 2020-11-25

## 2020-10-27 ENCOUNTER — Telehealth: Payer: Self-pay | Admitting: Student

## 2020-10-27 MED ORDER — LISINOPRIL 20 MG PO TABS: 20.0000 mg | ORAL_TABLET | Freq: Two times a day (BID) | ORAL | 3 refills | Status: DC

## 2020-10-27 NOTE — Telephone Encounter (Signed)
Medication refill request Lisinopril 20 mg tablets approved and sent to [facility].

## 2020-10-27 NOTE — Telephone Encounter (Signed)
*  STAT* If patient is at the pharmacy, call can be transferred to refill team.   1. Which medications need to be refilled? (please list name of each medication and dose if known)  lisinopril (ZESTRIL) 20 MG tablet [[id number]] ENDED  2. Which pharmacy/location (including street and city if local pharmacy) is medication to be sent to? [facility] on [address]   3. Do they need a 30 day or 90 day supply?

## 2020-11-23 ENCOUNTER — Emergency Department (HOSPITAL_COMMUNITY)
Admission: EM | Admit: 2020-11-23 | Discharge: 2020-11-23 | Disposition: A | Discharge status: Home / Self Care | Payer: PPO | Attending: Emergency Medicine | Admitting: Emergency Medicine

## 2020-11-23 ENCOUNTER — Emergency Department (HOSPITAL_COMMUNITY): Payer: PPO

## 2020-11-23 ENCOUNTER — Encounter (HOSPITAL_COMMUNITY): Payer: Self-pay

## 2020-11-23 ENCOUNTER — Other Ambulatory Visit: Payer: Self-pay

## 2020-11-23 DIAGNOSIS — Z7901 Long term (current) use of anticoagulants: Secondary | ICD-10-CM | POA: Insufficient documentation

## 2020-11-23 DIAGNOSIS — I517 Cardiomegaly: Secondary | ICD-10-CM | POA: Diagnosis not present

## 2020-11-23 DIAGNOSIS — F17210 Nicotine dependence, cigarettes, uncomplicated: Secondary | ICD-10-CM | POA: Diagnosis not present

## 2020-11-23 DIAGNOSIS — I4891 Unspecified atrial fibrillation: Secondary | ICD-10-CM | POA: Diagnosis not present

## 2020-11-23 DIAGNOSIS — R062 Wheezing: Secondary | ICD-10-CM | POA: Insufficient documentation

## 2020-11-23 DIAGNOSIS — Z79899 Other long term (current) drug therapy: Secondary | ICD-10-CM | POA: Insufficient documentation

## 2020-11-23 DIAGNOSIS — R0602 Shortness of breath: Secondary | ICD-10-CM | POA: Insufficient documentation

## 2020-11-23 DIAGNOSIS — I110 Hypertensive heart disease with heart failure: Secondary | ICD-10-CM | POA: Insufficient documentation

## 2020-11-23 DIAGNOSIS — I10 Essential (primary) hypertension: Secondary | ICD-10-CM | POA: Diagnosis not present

## 2020-11-23 DIAGNOSIS — I509 Heart failure, unspecified: Secondary | ICD-10-CM | POA: Diagnosis not present

## 2020-11-23 DIAGNOSIS — I5023 Acute on chronic systolic (congestive) heart failure: Secondary | ICD-10-CM | POA: Diagnosis not present

## 2020-11-23 DIAGNOSIS — J9811 Atelectasis: Secondary | ICD-10-CM | POA: Diagnosis not present

## 2020-11-23 LAB — BASIC METABOLIC PANEL
Anion gap: 7 (ref 5–15)
BUN: 14 mg/dL (ref 8–23)
CO2: 26 mmol/L (ref 22–32)
Calcium: 8.8 mg/dL — ABNORMAL LOW (ref 8.9–10.3)
Chloride: 105 mmol/L (ref 98–111)
Creatinine, Ser: 1.19 mg/dL — ABNORMAL HIGH (ref 0.44–1.00)
GFR, Estimated: 48 mL/min — ABNORMAL LOW (ref >60)
Glucose, Bld: 105 mg/dL — ABNORMAL HIGH (ref 70–99)
Potassium: 4.2 mmol/L (ref 3.5–5.1)
Sodium: 138 mmol/L (ref 135–145)

## 2020-11-23 LAB — CBC WITH DIFFERENTIAL/PLATELET
Abs Immature Granulocytes: 0.03 10*3/uL (ref 0.00–0.07)
Basophils Absolute: 0 10*3/uL (ref 0.0–0.1)
Basophils Relative: 0 %
Eosinophils Absolute: 0.2 10*3/uL (ref 0.0–0.5)
Eosinophils Relative: 2 %
HCT: 41 % (ref 36.0–46.0)
Hemoglobin: 12.6 g/dL (ref 12.0–15.0)
Immature Granulocytes: 0 %
Lymphocytes Relative: 12 %
Lymphs Abs: 0.9 10*3/uL (ref 0.7–4.0)
MCH: 27.1 pg (ref 26.0–34.0)
MCHC: 30.7 g/dL (ref 30.0–36.0)
MCV: 88.2 fL (ref 80.0–100.0)
Monocytes Absolute: 0.4 10*3/uL (ref 0.1–1.0)
Monocytes Relative: 6 %
Neutro Abs: 6.3 10*3/uL (ref 1.7–7.7)
Neutrophils Relative %: 80 %
Platelets: 220 10*3/uL (ref 150–400)
RBC: 4.65 MIL/uL (ref 3.87–5.11)
RDW: 17.3 % — ABNORMAL HIGH (ref 11.5–15.5)
WBC: 7.9 10*3/uL (ref 4.0–10.5)
nRBC: 0 % (ref 0.0–0.2)

## 2020-11-23 LAB — TROPONIN I (HIGH SENSITIVITY)
Troponin I (High Sensitivity): 27 ng/L — ABNORMAL HIGH (ref <18)
Troponin I (High Sensitivity): 25 ng/L — ABNORMAL HIGH (ref <18)

## 2020-11-23 LAB — BRAIN NATRIURETIC PEPTIDE: B Natriuretic Peptide: 788 pg/mL — ABNORMAL HIGH (ref 0.0–100.0)

## 2020-11-23 MED ORDER — ALBUTEROL SULFATE HFA 108 (90 BASE) MCG/ACT IN AERS
4.0000 | INHALATION_SPRAY | Freq: Once | RESPIRATORY_TRACT | Status: AC
  Administered 2020-11-23: 13:00:00 4 via RESPIRATORY_TRACT
  Filled 2020-11-23: qty 6.7

## 2020-11-23 MED ORDER — FUROSEMIDE 10 MG/ML IJ SOLN: 20.0000 mg | Freq: Once | INTRAMUSCULAR | Status: DC

## 2020-11-23 MED ORDER — FUROSEMIDE 20 MG PO TABS: 20.0000 mg | ORAL_TABLET | Freq: Every day | ORAL | 0 refills | Status: DC

## 2020-11-23 MED ORDER — AEROCHAMBER PLUS FLO-VU MEDIUM MISC
1.0000 | Freq: Once | Status: AC
  Administered 2020-11-23: 13:00:00 1
  Filled 2020-11-23: qty 1

## 2020-11-23 MED ORDER — FUROSEMIDE 40 MG PO TABS
40.0000 mg | ORAL_TABLET | Freq: Once | ORAL | Status: AC
  Administered 2020-11-23: 13:00:00 40 mg via ORAL
  Filled 2020-11-23: qty 1

## 2020-11-23 NOTE — Discharge Instructions (Addendum)
It was a pleasure taking care of you today. As discussed, your labs show you may be having a heart failure exacerbation which could be causing your shortness of breath. I am sending you home with a diuretic. Take daily for the next 5 days, your cardiologist may decide to continue it. Follow-up with your cardiologist at your scheduled appointment on Thursday. Return to the ER for new or worsening symptoms.
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
## 2020-11-23 NOTE — ED Notes (Signed)
While ambulating pt spo2 stayed between 96% and 99%

## 2020-11-23 NOTE — ED Triage Notes (Signed)
Pt to er, pt states that she is here because she is grumpy, states that her sister made her come to the er, states that nothing is wrong with her, states that she is here for sob, pt talking in full sentences, resps even and unlabored.  Pt denies pain.

## 2020-11-25 ENCOUNTER — Ambulatory Visit: Payer: PPO | Admitting: Cardiology

## 2020-11-25 ENCOUNTER — Encounter: Payer: Self-pay | Admitting: Cardiology

## 2020-11-25 ENCOUNTER — Other Ambulatory Visit: Payer: Self-pay

## 2020-11-25 VITALS — BP 140/82 | HR 98 | Ht 64.0 in | Wt 132.2 lb

## 2020-11-25 DIAGNOSIS — Z79899 Other long term (current) drug therapy: Secondary | ICD-10-CM | POA: Diagnosis not present

## 2020-11-25 DIAGNOSIS — I4821 Permanent atrial fibrillation: Secondary | ICD-10-CM | POA: Diagnosis not present

## 2020-11-25 DIAGNOSIS — R0602 Shortness of breath: Secondary | ICD-10-CM

## 2020-11-25 MED ORDER — FUROSEMIDE 20 MG PO TABS: ORAL_TABLET | ORAL | 6 refills | Status: DC

## 2020-11-25 NOTE — Patient Instructions (Signed)
Medication Instructions:   Take Lasix 20 mg on Monday, Wednesday, and Friday   *If you need a refill on your cardiac medications before your next appointment, please call your pharmacy*   Lab Work:  BMET, Magnesium in 3 weeks   ( 12/16/20)  If you have labs (blood work) drawn today and your tests are completely normal, you will receive your results only by: MyChart Message (if you have MyChart) OR A paper copy in the mail If you have any lab test that is abnormal or we need to change your treatment, we will call you to review the results.   Testing/Procedures: Your physician has requested that you have an echocardiogram. Echocardiography is a painless test that uses sound waves to create images of your heart. It provides your doctor with information about the size and shape of your heart and how well your heart's chambers and valves are working. This procedure takes approximately one hour. There are no restrictions for this procedure.    Follow-Up: At [facility], you and your health needs are our priority.  As part of our continuing mission to provide you with exceptional heart care, we have created designated Provider Care Teams.  These Care Teams include your primary Cardiologist (physician) and Advanced Practice Providers (APPs -  Physician Assistants and Nurse Practitioners) who all work together to provide you with the care you need, when you need it.  We recommend signing up for the patient portal called "MyChart".  Sign up information is provided on this After Visit Summary.  MyChart is used to connect with patients for Virtual Visits (Telemedicine).  Patients are able to view lab/test results, encounter notes, upcoming appointments, etc.  Non-urgent messages can be sent to your provider as well.   To learn more about what you can do with MyChart, go to [email or web address].    Your next appointment:   2 month(s)  The format for your next appointment:   In  Person  Provider:   Dina Rich, MD, Randall An, PA-C, or Jacolyn Reedy, PA-C   Other Instructions None

## 2020-11-25 NOTE — Progress Notes (Signed)
Clinical Summary Rebecca Huerta is a 75 y.o.female former patient of Dr Purvis Sheffield, this is our first visit together. She is seen for the following medical problems.  Permanent afib - no recent palpitations Estimated Creatinine Clearance: 35.3 mL/min (A) (by C-G formula based on SCr of 1.19 mg/dL (H)). She is on xarelto 15mg  daily    2. History of cardiomyopathy - LVEF previously as low as 25-30% in 2015. Thought to be tachy mediated CM - LVEF later normalized - some recent LE edema, SOB/DOE  3. SOB - seen in ER 11/23/20 with SOB, LE edema - elevated BNP 788, given IV lasix in ER. Started on oral lasix 20mg  daily x 5 days.  - has not taken her meds regularly.      Past Medical History:  Diagnosis Date   A-fib (HCC)    Anxiety    CHF (congestive heart failure) (HCC)    a.  EF was previously 25-30% in 2015 and felt to be tachycardia-mediated b. EF normalized to 55-60% by echo in 2016   Hypertension      Allergies  Allergen Reactions   Penicillins Rash     Current Outpatient Medications  Medication Sig Dispense Refill   acetaminophen (TYLENOL) 325 MG tablet Take 2 tablets (650 mg total) by mouth every 6 (six) hours.     amLODipine (NORVASC) 5 MG tablet Take 1 tablet (5 mg total) by mouth daily. 90 tablet 3   carvedilol (COREG) 12.5 MG tablet Take 1 tablet (12.5 mg total) by mouth 2 (two) times daily with a meal. 180 tablet 3   folic acid (FOLVITE) 1 MG tablet TAKE 1 TABLET(1 MG) BY MOUTH DAILY 30 tablet 11   furosemide (LASIX) 20 MG tablet Take 1 tablet (20 mg total) by mouth daily for 5 days. 5 tablet 0   lisinopril (ZESTRIL) 20 MG tablet Take 1 tablet (20 mg total) by mouth in the morning and at bedtime. 180 tablet 3   Multiple Vitamin (MULTIVITAMIN) tablet Take 1 tablet by mouth daily.     Omega-3 1000 MG CAPS Take by mouth.     Rivaroxaban (XARELTO) 15 MG TABS tablet Take 1 tablet (15 mg total) by mouth daily with supper. 90 tablet 3   thiamine 100 MG tablet Take 1  tablet (100 mg total) by mouth daily. 90 tablet 2   No current facility-administered medications for this visit.     Past Surgical History:  Procedure Laterality Date   TUBAL LIGATION       Allergies  Allergen Reactions   Penicillins Rash      No family history on file.   Social History Ms. Belsky reports that she has been smoking cigarettes. She started smoking about 59 years ago. She has been smoking an average of 0.50 packs per day. She has never used smokeless tobacco. Ms. Quincy reports current alcohol use.   Review of Systems CONSTITUTIONAL: No weight loss, fever, chills, weakness or fatigue.  HEENT: Eyes: No visual loss, blurred vision, double vision or yellow sclerae.No hearing loss, sneezing, congestion, runny nose or sore throat.  SKIN: No rash or itching.  CARDIOVASCULAR: per hpi RESPIRATORY: per hpi GASTROINTESTINAL: No anorexia, nausea, vomiting or diarrhea. No abdominal pain or blood.  GENITOURINARY: No burning on urination, no polyuria NEUROLOGICAL: No headache, dizziness, syncope, paralysis, ataxia, numbness or tingling in the extremities. No change in bowel or bladder control.  MUSCULOSKELETAL: No muscle, back pain, joint pain or stiffness.  LYMPHATICS: No enlarged nodes. No  history of splenectomy.  PSYCHIATRIC: No history of depression or anxiety.  ENDOCRINOLOGIC: No reports of sweating, cold or heat intolerance. No polyuria or polydipsia.  Marland Kitchen   Physical Examination Today's Vitals   11/25/20 0931  BP: 140/82  Pulse: 98  SpO2: 100%  Weight: 132 lb 3.2 oz (60 kg)  Height: 5\' 4"  (1.626 m)   Body mass index is 22.69 kg/m.  Gen: resting comfortably, no acute distress HEENT: no scleral icterus, pupils equal round and reactive, no palptable cervical adenopathy,  CV: irreg, no m/r/g, no jvd Resp: Clear to auscultation bilaterally GI: abdomen is soft, non-tender, non-distended, normal bowel sounds, no hepatosplenomegaly MSK: extremities are warm, no  edema.  Skin: warm, no rash Neuro:  no focal deficits Psych: appropriate affect     Assessment and Plan 1.SOB - recent SOB/DOE, LE edema. During ER visit elevated BNP, started on lasix. Mixed compliance at home - appears euvolemic today - would restart lasix 20mg  on Mon,Wed, Fri - prior history of cardiomyopathy, with new onset edema will repeat echo - check bmet/mg 3 weeks on diuretic  2. Permanent afib -= no symptoms, rates are controlled. She is on renally adjusted xarelto -continue current meds    F/u 2 months      , M.D

## 2020-11-30 ENCOUNTER — Telehealth: Payer: Self-pay | Admitting: Cardiology

## 2020-11-30 NOTE — Telephone Encounter (Signed)
Noted message from patients daughter, [name] Howden-Law and we will not make any appointment changes unless we speak with her first.

## 2020-11-30 NOTE — Telephone Encounter (Signed)
Patient daughter called - patient has gotten worse - do not cancel her appointments unless you speak with daughter.    Her daughter said she is getting her medicine messed up , she is not keeping it straight and they had to take it away and manage it for her.

## 2020-12-03 DIAGNOSIS — I4821 Permanent atrial fibrillation: Secondary | ICD-10-CM | POA: Diagnosis not present

## 2020-12-03 DIAGNOSIS — I5023 Acute on chronic systolic (congestive) heart failure: Secondary | ICD-10-CM | POA: Diagnosis not present

## 2020-12-03 DIAGNOSIS — I5022 Chronic systolic (congestive) heart failure: Secondary | ICD-10-CM | POA: Diagnosis not present

## 2020-12-03 DIAGNOSIS — R413 Other amnesia: Secondary | ICD-10-CM | POA: Diagnosis not present

## 2020-12-08 ENCOUNTER — Ambulatory Visit (HOSPITAL_COMMUNITY): Admission: RE | Admit: 2020-12-08 | Payer: PPO | Source: Ambulatory Visit

## 2020-12-14 ENCOUNTER — Other Ambulatory Visit: Payer: Self-pay | Admitting: Physician Assistant

## 2020-12-14 DIAGNOSIS — R413 Other amnesia: Secondary | ICD-10-CM

## 2020-12-16 ENCOUNTER — Other Ambulatory Visit (HOSPITAL_COMMUNITY)
Admission: RE | Admit: 2020-12-16 | Discharge: 2020-12-16 | Disposition: A | Discharge status: Home / Self Care | Payer: PPO | Source: Ambulatory Visit | Attending: Cardiology | Admitting: Cardiology

## 2020-12-16 ENCOUNTER — Telehealth: Payer: Self-pay

## 2020-12-16 ENCOUNTER — Other Ambulatory Visit: Payer: Self-pay

## 2020-12-16 DIAGNOSIS — Z79899 Other long term (current) drug therapy: Secondary | ICD-10-CM

## 2020-12-16 LAB — BASIC METABOLIC PANEL
Anion gap: 9 (ref 5–15)
BUN: 14 mg/dL (ref 8–23)
CO2: 25 mmol/L (ref 22–32)
Calcium: 8.9 mg/dL (ref 8.9–10.3)
Chloride: 103 mmol/L (ref 98–111)
Creatinine, Ser: 1.15 mg/dL — ABNORMAL HIGH (ref 0.44–1.00)
GFR, Estimated: 50 mL/min — ABNORMAL LOW (ref >60)
Glucose, Bld: 125 mg/dL — ABNORMAL HIGH (ref 70–99)
Potassium: 3.7 mmol/L (ref 3.5–5.1)
Sodium: 137 mmol/L (ref 135–145)

## 2020-12-16 LAB — MAGNESIUM: Magnesium: 2 mg/dL (ref 1.7–2.4)

## 2020-12-16 NOTE — Telephone Encounter (Signed)
Pt notified and voiced understanding. Pt had no questions or concerns at this time. 

## 2020-12-16 NOTE — Telephone Encounter (Signed)
-----   Message from Antoine Poche, MD sent at 12/16/2020  4:19 PM EDT ----- Labs look fine. Very mild decreased kidney function that has improved some since last labs.   Dominga Ferry MD

## 2020-12-27 ENCOUNTER — Ambulatory Visit (HOSPITAL_COMMUNITY)
Admission: RE | Admit: 2020-12-27 | Discharge: 2020-12-27 | Disposition: A | Discharge status: Home / Self Care | Payer: PPO | Source: Ambulatory Visit | Attending: Cardiology | Admitting: Cardiology

## 2020-12-27 ENCOUNTER — Other Ambulatory Visit: Payer: Self-pay

## 2020-12-27 DIAGNOSIS — R0602 Shortness of breath: Secondary | ICD-10-CM | POA: Insufficient documentation

## 2020-12-28 ENCOUNTER — Telehealth: Payer: Self-pay

## 2020-12-28 LAB — ECHOCARDIOGRAM COMPLETE
Area-P 1/2: 6.65 cm2
MV M vel: 4.74 m/s
MV Peak grad: 89.9 mmHg
S' Lateral: 4.9 cm

## 2020-12-28 NOTE — Telephone Encounter (Signed)
-----   Message from Antoine Poche, MD sent at 12/28/2020  9:50 AM EDT ----- Echo does show some mild weakness of the heart muscle, will discuss at her upcoming f/u. Likely will need to make some medication changes to strengthen  Dominga Ferry MD

## 2020-12-28 NOTE — Telephone Encounter (Signed)
Notified pt's daughter of results and she verbalized understanding.

## 2020-12-28 NOTE — Progress Notes (Deleted)
Cardiology Office Note    Date:  12/28/2020   ID:  Rebecca Huerta, DOB [birth year], MRN [id number]   PCP:  Delorse Lek, MD   [facility]  Cardiologist:  Prentice Docker, MD (Inactive) *** Advanced Practice Provider:  No care team member to display Electrophysiologist:  None   [phone]   No chief complaint on file.   History of Present Illness:  Rebecca Huerta is a 75 y.o. female with history of Permanent atrial fibrillation on Xarelto 15 mg daily,History of cardiomyopathy LVEF as low as 25 to 30% in 2015 felt to be tachycardia mediated but follow-up echo 04/2015 LVEF 55 to 60%.  Patient saw Dr. Wyline Mood 11/25/2020 with worsening edema.  She had been in the ER and had an elevated BNP and was started on Lasix.  She was not always taking her medications at home.  2D echo 12/27/2020 LVEF 40% with diffuse hypokinesis LV with severely dilated mild LVH mild aortic valve sclerosis and moderate MR    Past Medical History:  Diagnosis Date   A-fib (HCC)    Anxiety    CHF (congestive heart failure) (HCC)    a.  EF was previously 25-30% in 2015 and felt to be tachycardia-mediated b. EF normalized to 55-60% by echo in 2016   Hypertension     Past Surgical History:  Procedure Laterality Date   TUBAL LIGATION      Current Medications: No outpatient medications have been marked as taking for the 01/10/21 encounter (Appointment) with Dyann Kief, PA-C.     Allergies:   Penicillins   Social History   Socioeconomic History   Marital status: Divorced    Spouse name: Not on file   Number of children: Not on file   Years of education: Not on file   Highest education level: Not on file  Occupational History   Not on file  Tobacco Use   Smoking status: Every Day    Packs/day: 0.50    Types: Cigarettes    Start date: 05/17/1961   Smokeless tobacco: Never  Vaping Use   Vaping Use: Never used  Substance and Sexual Activity   Alcohol use: Yes   Drug use:  No   Sexual activity: Not Currently  Other Topics Concern   Not on file  Social History Narrative   Not on file   Social Determinants of Health   Financial Resource Strain: Not on file  Food Insecurity: Not on file  Transportation Needs: Not on file  Physical Activity: Not on file  Stress: Not on file  Social Connections: Not on file     Family History:  The patient's ***family history is not on file.   ROS:   Please see the history of present illness.    ROS All other systems reviewed and are negative.   PHYSICAL EXAM:   VS:  There were no vitals taken for this visit.  Physical Exam  GEN: Well nourished, well developed, in no acute distress  HEENT: normal  Neck: no JVD, carotid bruits, or masses Cardiac:RRR; no murmurs, rubs, or gallops  Respiratory:  clear to auscultation bilaterally, normal work of breathing GI: soft, nontender, nondistended, + BS Ext: without cyanosis, clubbing, or edema, Good distal pulses bilaterally MS: no deformity or atrophy  Skin: warm and dry, no rash Neuro:  Alert and Oriented x 3, Strength and sensation are intact Psych: euthymic mood, full affect  Wt Readings from Last 3 Encounters:  11/25/20 132 lb  3.2 oz (60 kg)  11/23/20 130 lb (59 kg)  04/22/20 135 lb (61.2 kg)      Studies/Labs Reviewed:   EKG:  EKG is*** ordered today.  The ekg ordered today demonstrates ***  Recent Labs: 04/07/2020: ALT 17 11/23/2020: B Natriuretic Peptide 788.0; Hemoglobin 12.6; Platelets 220 12/16/2020: BUN 14; Creatinine, Ser 1.15; Magnesium 2.0; Potassium 3.7; Sodium 137   Lipid Panel No results found for: CHOL, TRIG, HDL, CHOLHDL, VLDL, LDLCALC, LDLDIRECT  Additional studies/ records that were reviewed today include:   2D echo 12/27/2020 Study Conclusions   - Left ventricle: The cavity size was normal. Wall thickness was    increased in a pattern of mild LVH. Systolic function was normal.    The estimated ejection fraction was in the range of 55%  to 60%.    Wall motion was normal; there were no regional wall motion    abnormalities.  - Aortic valve: Mildly calcified annulus. Trileaflet; mildly    thickened leaflets. Valve area (VTI): 1.62 cm^2. Valve area    (Vmax): 1.55 cm^2.  - Mitral valve: There was mild regurgitation.  - Left atrium: The atrium was severely dilated.  - Right atrium: The atrium was mildly dilated.  - Atrial septum: No defect or patent foramen ovale was identified.  - Pulmonary arteries: Systolic pressure could not be accurately    estimated. Inadequate TR jet.  - Technically adequate study.   Transthoracic echocardiography.  M-mode, complete 2D, spectral  Doppler, and color Doppler.  Birthdate:  Patient birthdate:  [birth year].  Age:  Patient is 75 yr old.  Sex:  Gender: female.  BMI: 23.9 kg/m^2.  Patient status:  Outpatient.  Study date:  Study  date: 04/20/2015. Study time: 11:28 AM.  Location:  Echo  laboratory.   IMPRESSIONS     1. The apical viiews are somehat foreshortend There is diffuse  hypokinesis, worse in the basal inferior and inferoseptal walls . Left  ventricular ejection fraction, by estimation, is 40%%. The left ventricle  demonstrates global hypokinesis. The left  ventricular internal cavity size was severely dilated. There is mild left  ventricular hypertrophy. Left ventricular diastolic parameters are  indeterminate.   2. Right ventricular systolic function is normal. The right ventricular  size is normal. There is moderately elevated pulmonary artery systolic  pressure.   3. Left atrial size was severely dilated.   4. The mitral valve is normal in structure. Moderate mitral valve  regurgitation.   5. Tricuspid valve regurgitation is mild to moderate.   6. The aortic valve is tricuspid. Aortic valve regurgitation is not  visualized. Mild aortic valve sclerosis is present, with no evidence of  aortic valve stenosis.   7. The inferior vena cava is dilated in size with >50%  respiratory  variability, suggesting right atrial pressure of 8 mmHg.   2D echo 04/2015 Study Conclusions   - Left ventricle: The cavity size was normal. Wall thickness was    increased in a pattern of mild LVH. Systolic function was normal.    The estimated ejection fraction was in the range of 55% to 60%.    Wall motion was normal; there were no regional wall motion    abnormalities.  - Aortic valve: Mildly calcified annulus. Trileaflet; mildly    thickened leaflets. Valve area (VTI): 1.62 cm^2. Valve area    (Vmax): 1.55 cm^2.  - Mitral valve: There was mild regurgitation.  - Left atrium: The atrium was severely dilated.  - Right atrium: The atrium  was mildly dilated.  - Atrial septum: No defect or patent foramen ovale was identified.  - Pulmonary arteries: Systolic pressure could not be accurately    estimated. Inadequate TR jet.  - Technically adequate study.   Transthoracic echocardiography.  M-mode, complete 2D, spectral  Doppler, and color Doppler.  Birthdate:  Patient birthdate:  05/20/1946.  Age:  Patient is 75 yr old.  Sex:  Gender: female.  BMI: 23.9 kg/m^2.  Patient status:  Outpatient.  Study date:  Study  date: 04/20/2015. Study time: 11:28 AM.  Location:  Echo  laboratory.    Risk Assessment/Calculations:   {Does this patient have ATRIAL FIBRILLATION?:[phone]}     ASSESSMENT:    No diagnosis found.   PLAN:  In order of problems listed above:  Permanent atrial fibrillation on Xarelto 15 mg daily  History of cardiomyopathy LVEF as low as 25 to 30% in 2015 felt to be tachycardia mediated but later normalized  Shared Decision Making/Informed Consent   {Are you ordering a CV Procedure (e.g. stress test, cath, DCCV, TEE, etc)?   Press F2        :[id number]}    Medication Adjustments/Labs and Tests Ordered: Current medicines are reviewed at length with the patient today.  Concerns regarding medicines are outlined above.  Medication changes, Labs and  Tests ordered today are listed in the Patient Instructions below. There are no Patient Instructions on file for this visit.   Signed, Jacolyn Reedy, PA-C  12/28/2020 1:40 PM    [facility] [address] [place], [place], [place]  [address] Phone: [phone]; Fax: [phone]

## 2020-12-29 ENCOUNTER — Telehealth: Payer: Self-pay | Admitting: Cardiology

## 2020-12-29 NOTE — Telephone Encounter (Signed)
I will send message to Vashti Hey, RN and Dr.Branch for direction.

## 2020-12-29 NOTE — Telephone Encounter (Signed)
[place], Per Dr Wyline Mood pt needs to hold Xarelto 48 hrs before procedure. Misty Stanley

## 2020-12-29 NOTE — Telephone Encounter (Signed)
Dr Wyline Mood, Are you good with pt holding Xarelto tonight for dental extractions tomorrow?   Misty Stanley

## 2020-12-29 NOTE — Telephone Encounter (Signed)
I spoke with daughter and since patient had not held Xarelto, they will reschedule her next week.

## 2020-12-29 NOTE — Telephone Encounter (Signed)
New message    Patient is also in AFIB again should she even have this done now?   Patient daughter is calling her mother is to have 6 teeth extracted tomorrow and she is concerned because the patient is on blood thinners, please advise what she should do , she wants to know if she should cancel appt   Dr Selena Batten @ [facility] is doing the procedure

## 2020-12-29 NOTE — Telephone Encounter (Signed)
Patient daughter called back do they need to cancel appointment for tomorrows extraction?

## 2020-12-30 ENCOUNTER — Telehealth: Payer: Self-pay

## 2020-12-30 MED ORDER — FOLIC ACID 1 MG PO TABS
ORAL_TABLET | ORAL | 11 refills | Status: DC
Start: 2020-12-30 — End: 2021-01-05

## 2020-12-30 NOTE — Telephone Encounter (Signed)
Medication refill request for Folic Acid 1 mg tablets approved and sent to [facility] pharmacy per pt request.

## 2020-12-31 ENCOUNTER — Emergency Department (HOSPITAL_COMMUNITY): Payer: PPO

## 2020-12-31 ENCOUNTER — Other Ambulatory Visit: Payer: Self-pay

## 2020-12-31 ENCOUNTER — Inpatient Hospital Stay (HOSPITAL_COMMUNITY)
Admission: EM | Admit: 2020-12-31 | Discharge: 2021-01-05 | DRG: 291 | Disposition: A | Discharge status: Skilled Nursing Facility | Payer: PPO | Attending: Family Medicine | Admitting: Family Medicine

## 2020-12-31 ENCOUNTER — Encounter (HOSPITAL_COMMUNITY): Payer: Self-pay | Admitting: Emergency Medicine

## 2020-12-31 DIAGNOSIS — F419 Anxiety disorder, unspecified: Secondary | ICD-10-CM | POA: Diagnosis present

## 2020-12-31 DIAGNOSIS — Z8249 Family history of ischemic heart disease and other diseases of the circulatory system: Secondary | ICD-10-CM

## 2020-12-31 DIAGNOSIS — Z7901 Long term (current) use of anticoagulants: Secondary | ICD-10-CM | POA: Diagnosis not present

## 2020-12-31 DIAGNOSIS — F0391 Unspecified dementia with behavioral disturbance: Secondary | ICD-10-CM | POA: Diagnosis not present

## 2020-12-31 DIAGNOSIS — I5043 Acute on chronic combined systolic (congestive) and diastolic (congestive) heart failure: Secondary | ICD-10-CM | POA: Diagnosis not present

## 2020-12-31 DIAGNOSIS — R29898 Other symptoms and signs involving the musculoskeletal system: Secondary | ICD-10-CM

## 2020-12-31 DIAGNOSIS — R0600 Dyspnea, unspecified: Secondary | ICD-10-CM | POA: Diagnosis not present

## 2020-12-31 DIAGNOSIS — Z66 Do not resuscitate: Secondary | ICD-10-CM | POA: Diagnosis not present

## 2020-12-31 DIAGNOSIS — I429 Cardiomyopathy, unspecified: Secondary | ICD-10-CM | POA: Diagnosis not present

## 2020-12-31 DIAGNOSIS — Z720 Tobacco use: Secondary | ICD-10-CM | POA: Diagnosis not present

## 2020-12-31 DIAGNOSIS — R079 Chest pain, unspecified: Secondary | ICD-10-CM

## 2020-12-31 DIAGNOSIS — I5041 Acute combined systolic (congestive) and diastolic (congestive) heart failure: Secondary | ICD-10-CM | POA: Diagnosis not present

## 2020-12-31 DIAGNOSIS — Z79899 Other long term (current) drug therapy: Secondary | ICD-10-CM

## 2020-12-31 DIAGNOSIS — E876 Hypokalemia: Secondary | ICD-10-CM | POA: Diagnosis not present

## 2020-12-31 DIAGNOSIS — Z20822 Contact with and (suspected) exposure to covid-19: Secondary | ICD-10-CM | POA: Diagnosis not present

## 2020-12-31 DIAGNOSIS — R4781 Slurred speech: Secondary | ICD-10-CM | POA: Diagnosis not present

## 2020-12-31 DIAGNOSIS — I248 Other forms of acute ischemic heart disease: Secondary | ICD-10-CM | POA: Diagnosis present

## 2020-12-31 DIAGNOSIS — I4891 Unspecified atrial fibrillation: Secondary | ICD-10-CM | POA: Diagnosis present

## 2020-12-31 DIAGNOSIS — I4821 Permanent atrial fibrillation: Secondary | ICD-10-CM | POA: Diagnosis not present

## 2020-12-31 DIAGNOSIS — R4701 Aphasia: Secondary | ICD-10-CM | POA: Diagnosis present

## 2020-12-31 DIAGNOSIS — I5023 Acute on chronic systolic (congestive) heart failure: Secondary | ICD-10-CM | POA: Diagnosis not present

## 2020-12-31 DIAGNOSIS — M6281 Muscle weakness (generalized): Secondary | ICD-10-CM | POA: Diagnosis present

## 2020-12-31 DIAGNOSIS — I272 Pulmonary hypertension, unspecified: Secondary | ICD-10-CM | POA: Diagnosis not present

## 2020-12-31 DIAGNOSIS — I447 Left bundle-branch block, unspecified: Secondary | ICD-10-CM | POA: Diagnosis present

## 2020-12-31 DIAGNOSIS — I110 Hypertensive heart disease with heart failure: Secondary | ICD-10-CM | POA: Diagnosis not present

## 2020-12-31 DIAGNOSIS — I509 Heart failure, unspecified: Principal | ICD-10-CM

## 2020-12-31 DIAGNOSIS — Z880 Allergy status to penicillin: Secondary | ICD-10-CM | POA: Diagnosis not present

## 2020-12-31 DIAGNOSIS — J449 Chronic obstructive pulmonary disease, unspecified: Secondary | ICD-10-CM | POA: Diagnosis present

## 2020-12-31 DIAGNOSIS — I10 Essential (primary) hypertension: Secondary | ICD-10-CM | POA: Diagnosis present

## 2020-12-31 DIAGNOSIS — R531 Weakness: Secondary | ICD-10-CM | POA: Diagnosis not present

## 2020-12-31 DIAGNOSIS — F03918 Unspecified dementia, unspecified severity, with other behavioral disturbance: Secondary | ICD-10-CM

## 2020-12-31 DIAGNOSIS — F17210 Nicotine dependence, cigarettes, uncomplicated: Secondary | ICD-10-CM | POA: Diagnosis not present

## 2020-12-31 DIAGNOSIS — I639 Cerebral infarction, unspecified: Secondary | ICD-10-CM | POA: Diagnosis not present

## 2020-12-31 DIAGNOSIS — I6622 Occlusion and stenosis of left posterior cerebral artery: Secondary | ICD-10-CM | POA: Diagnosis not present

## 2020-12-31 DIAGNOSIS — I6523 Occlusion and stenosis of bilateral carotid arteries: Secondary | ICD-10-CM | POA: Diagnosis not present

## 2020-12-31 DIAGNOSIS — J9601 Acute respiratory failure with hypoxia: Secondary | ICD-10-CM | POA: Diagnosis present

## 2020-12-31 LAB — TROPONIN I (HIGH SENSITIVITY)
Troponin I (High Sensitivity): 37 ng/L — ABNORMAL HIGH (ref <18)
Troponin I (High Sensitivity): 34 ng/L — ABNORMAL HIGH (ref <18)

## 2020-12-31 LAB — COMPREHENSIVE METABOLIC PANEL
ALT: 35 U/L (ref 0–44)
AST: 33 U/L (ref 15–41)
Albumin: 3.2 g/dL — ABNORMAL LOW (ref 3.5–5.0)
Alkaline Phosphatase: 74 U/L (ref 38–126)
Anion gap: 8 (ref 5–15)
BUN: 19 mg/dL (ref 8–23)
CO2: 25 mmol/L (ref 22–32)
Calcium: 8.8 mg/dL — ABNORMAL LOW (ref 8.9–10.3)
Chloride: 107 mmol/L (ref 98–111)
Creatinine, Ser: 1.11 mg/dL — ABNORMAL HIGH (ref 0.44–1.00)
GFR, Estimated: 52 mL/min — ABNORMAL LOW (ref >60)
Glucose, Bld: 128 mg/dL — ABNORMAL HIGH (ref 70–99)
Potassium: 3.4 mmol/L — ABNORMAL LOW (ref 3.5–5.1)
Sodium: 140 mmol/L (ref 135–145)
Total Bilirubin: 0.6 mg/dL (ref 0.3–1.2)
Total Protein: 7.1 g/dL (ref 6.5–8.1)

## 2020-12-31 LAB — CBC
HCT: 39.1 % (ref 36.0–46.0)
Hemoglobin: 11.8 g/dL — ABNORMAL LOW (ref 12.0–15.0)
MCH: 27.1 pg (ref 26.0–34.0)
MCHC: 30.2 g/dL (ref 30.0–36.0)
MCV: 89.9 fL (ref 80.0–100.0)
Platelets: 246 10*3/uL (ref 150–400)
RBC: 4.35 MIL/uL (ref 3.87–5.11)
RDW: 17 % — ABNORMAL HIGH (ref 11.5–15.5)
WBC: 8.9 10*3/uL (ref 4.0–10.5)
nRBC: 0 % (ref 0.0–0.2)

## 2020-12-31 LAB — RESP PANEL BY RT-PCR (FLU A&B, COVID) ARPGX2
Influenza A by PCR: NEGATIVE
Influenza B by PCR: NEGATIVE
SARS Coronavirus 2 by RT PCR: NEGATIVE

## 2020-12-31 LAB — BRAIN NATRIURETIC PEPTIDE: B Natriuretic Peptide: 1405 pg/mL — ABNORMAL HIGH (ref 0.0–100.0)

## 2020-12-31 MED ORDER — IPRATROPIUM-ALBUTEROL 0.5-2.5 (3) MG/3ML IN SOLN
3.0000 mL | Freq: Four times a day (QID) | RESPIRATORY_TRACT | Status: DC
  Administered 2020-12-31: 3 mL via RESPIRATORY_TRACT
  Filled 2020-12-31: qty 3

## 2020-12-31 MED ORDER — THIAMINE HCL 100 MG PO TABS
100.0000 mg | ORAL_TABLET | Freq: Every day | ORAL | Status: DC
  Administered 2020-12-31 – 2021-01-05 (×6): 100 mg via ORAL
  Filled 2020-12-31 (×5): qty 1

## 2020-12-31 MED ORDER — OXYCODONE HCL 5 MG PO TABS
5.0000 mg | ORAL_TABLET | ORAL | Status: DC | PRN
  Administered 2021-01-02: 5 mg via ORAL
  Filled 2020-12-31: qty 1

## 2020-12-31 MED ORDER — MORPHINE SULFATE (PF) 2 MG/ML IV SOLN
2.0000 mg | INTRAVENOUS | Status: DC | PRN
Start: 2020-12-31 — End: 2021-01-05

## 2020-12-31 MED ORDER — FUROSEMIDE 10 MG/ML IJ SOLN: 20.0000 mg | Freq: Every day | INTRAMUSCULAR | Status: DC

## 2020-12-31 MED ORDER — HALOPERIDOL LACTATE 5 MG/ML IJ SOLN
2.0000 mg | Freq: Once | INTRAMUSCULAR | Status: AC
  Administered 2020-12-31: 2 mg via INTRAVENOUS
  Filled 2020-12-31: qty 1

## 2020-12-31 MED ORDER — CARVEDILOL 12.5 MG PO TABS
12.5000 mg | ORAL_TABLET | Freq: Two times a day (BID) | ORAL | Status: DC
  Administered 2020-12-31 – 2021-01-01 (×3): 12.5 mg via ORAL
  Filled 2020-12-31 (×2): qty 1

## 2020-12-31 MED ORDER — LORAZEPAM 2 MG/ML IJ SOLN
1.0000 mg | INTRAMUSCULAR | Status: DC | PRN
  Administered 2020-12-31 – 2021-01-01 (×2): 1 mg via INTRAVENOUS
  Filled 2020-12-31 (×2): qty 1

## 2020-12-31 MED ORDER — ONDANSETRON HCL 4 MG PO TABS: 4.0000 mg | ORAL_TABLET | Freq: Four times a day (QID) | ORAL | Status: DC | PRN

## 2020-12-31 MED ORDER — POTASSIUM CHLORIDE 10 MEQ/100ML IV SOLN
10.0000 meq | INTRAVENOUS | Status: AC
Start: 2020-12-31 — End: 2020-12-31
  Administered 2020-12-31 (×3): 10 meq via INTRAVENOUS
  Filled 2020-12-31 (×3): qty 100

## 2020-12-31 MED ORDER — FUROSEMIDE 10 MG/ML IJ SOLN
40.0000 mg | Freq: Every day | INTRAMUSCULAR | Status: DC
Start: 2020-12-31 — End: 2021-01-02
  Administered 2020-12-31 – 2021-01-01 (×2): 40 mg via INTRAVENOUS
  Filled 2020-12-31 (×2): qty 4

## 2020-12-31 MED ORDER — ACETAMINOPHEN 325 MG PO TABS
650.0000 mg | ORAL_TABLET | Freq: Four times a day (QID) | ORAL | Status: DC | PRN
  Administered 2021-01-02: 650 mg via ORAL
  Filled 2020-12-31: qty 2

## 2020-12-31 MED ORDER — FUROSEMIDE 10 MG/ML IJ SOLN
20.0000 mg | Freq: Once | INTRAMUSCULAR | Status: AC
  Administered 2020-12-31: 20 mg via INTRAVENOUS
  Filled 2020-12-31: qty 2

## 2020-12-31 MED ORDER — GUAIFENESIN ER 600 MG PO TB12
1200.0000 mg | ORAL_TABLET | Freq: Two times a day (BID) | ORAL | Status: DC
  Administered 2021-01-01 – 2021-01-05 (×8): 1200 mg via ORAL
  Filled 2020-12-31 (×9): qty 2

## 2020-12-31 MED ORDER — ONDANSETRON HCL 4 MG/2ML IJ SOLN
4.0000 mg | Freq: Four times a day (QID) | INTRAMUSCULAR | Status: DC | PRN
  Administered 2020-12-31: 4 mg via INTRAVENOUS
  Filled 2020-12-31: qty 2

## 2020-12-31 MED ORDER — NICOTINE 21 MG/24HR TD PT24
21.0000 mg | MEDICATED_PATCH | Freq: Every day | TRANSDERMAL | Status: DC
  Administered 2020-12-31 – 2021-01-05 (×6): 21 mg via TRANSDERMAL
  Filled 2020-12-31 (×6): qty 1

## 2020-12-31 MED ORDER — ACETAMINOPHEN 650 MG RE SUPP: 650.0000 mg | Freq: Four times a day (QID) | RECTAL | Status: DC | PRN

## 2020-12-31 MED ORDER — BUDESONIDE 0.25 MG/2ML IN SUSP
0.2500 mg | Freq: Two times a day (BID) | RESPIRATORY_TRACT | Status: DC
  Administered 2020-12-31 – 2021-01-05 (×9): 0.25 mg via RESPIRATORY_TRACT
  Filled 2020-12-31 (×9): qty 2

## 2020-12-31 MED ORDER — IPRATROPIUM-ALBUTEROL 0.5-2.5 (3) MG/3ML IN SOLN
3.0000 mL | Freq: Four times a day (QID) | RESPIRATORY_TRACT | Status: DC
  Administered 2021-01-01 – 2021-01-03 (×8): 3 mL via RESPIRATORY_TRACT
  Filled 2020-12-31 (×9): qty 3

## 2020-12-31 MED ORDER — NITROGLYCERIN 0.4 MG SL SUBL
0.4000 mg | SUBLINGUAL_TABLET | Freq: Once | SUBLINGUAL | Status: AC
  Administered 2020-12-31: 0.4 mg via SUBLINGUAL
  Filled 2020-12-31: qty 1

## 2020-12-31 MED ORDER — LORAZEPAM 2 MG/ML IJ SOLN
1.0000 mg | Freq: Once | INTRAMUSCULAR | Status: AC
  Administered 2020-12-31: 1 mg via INTRAVENOUS
  Filled 2020-12-31: qty 1

## 2020-12-31 MED ORDER — POLYETHYLENE GLYCOL 3350 17 G PO PACK: 17.0000 g | PACK | Freq: Every day | ORAL | Status: DC | PRN

## 2020-12-31 MED ORDER — RIVAROXABAN 15 MG PO TABS
15.0000 mg | ORAL_TABLET | Freq: Every day | ORAL | Status: DC
  Administered 2020-12-31 – 2021-01-02 (×3): 15 mg via ORAL
  Filled 2020-12-31 (×3): qty 1

## 2020-12-31 MED ORDER — LISINOPRIL 10 MG PO TABS: 20.0000 mg | ORAL_TABLET | Freq: Every day | ORAL | Status: DC

## 2020-12-31 NOTE — ED Notes (Signed)
Nurse into room per family request pt is climbing out of bed and becoming very agitated with family and staff. Pt states she is leaving. Pt is alert to self and time however is confused about situation and where she is. Pt removing oxygen and fighting with staff on replacing same. Dr. Kerry Hough notified at this time and an order for ativan was given. Pt placed in mitts at this time as well.

## 2020-12-31 NOTE — Progress Notes (Signed)
Patient taken off of BIPAP and placed on 3LNC. Patient is tolerating well at this time. Will monitor as needed.

## 2020-12-31 NOTE — ED Notes (Signed)
Pt's daughter Anndrea Mihelich CB# [phone] updated via telephone of pt negative covid result

## 2020-12-31 NOTE — Care Management Obs Status (Signed)
MEDICARE OBSERVATION STATUS NOTIFICATION   Patient Details  Name: Rebecca Huerta MRN: [id number] Date of Birth: [birth year]   Medicare Observation Status Notification Given:  Yes    Villa Herb, LCSWA 12/31/2020, 8:27 AM

## 2020-12-31 NOTE — ED Notes (Signed)
Daughter brought to bedside. EDP to bedside.

## 2020-12-31 NOTE — ED Notes (Signed)
Respiratory to bedside. Pt bipap beeping, pt encouraged to decrease conversation to facilitate better flow with bipap. BP 158/111,EDP made aware. No new orders.

## 2020-12-31 NOTE — ED Notes (Signed)
Respiratory at bedside. Bipap removed.

## 2020-12-31 NOTE — Progress Notes (Signed)
RT came to give breathing treatment to patient and noticed patients RR was in the mid 30's and she seemed to be working a little harder to breathe. RT informed patient and patients daughter that patient would be placed back on BIPAP due to increased WOB. Patient stated she felt nauseous so RT was unable to place patient on BIPAP due to the concern of possible aspiration in the event the patient vomits. RT spoke with RN and RN stated she will give Zofran to patient. RT informed RN that once the nausea subsided if the patient still seemed to have increased WOB to please call and RT would place patient back on BIPAP. RN agreed. BIPAP is in the room on standby when needed.

## 2020-12-31 NOTE — ED Notes (Signed)
RT called and informed pt RR continuously 20-23 for the last 50 minutes and no increased WOB.

## 2020-12-31 NOTE — Progress Notes (Signed)
Patient admitted to the hospital earlier this morning by Dr. Carren Rang  Patient seen and examined.  She is confused at time which is apparently near her baseline.  Continues to be mildly short of breath in conversation.  Lungs are mostly clear and she has trace to 1+ pitting edema lower extremity.  Assessment/plan  Acute combined CHF. -EF 40% -Plan is to continue IV diuresis for another 24 hours -Continue on Coreg -Can likely start on Entresto tomorrow -Appreciate cardiology input  Acute respiratory failure with hypoxia -Due to CHF/COPD -Patient required BiPAP on admission -She has not been weaned down to nasal cannula -Continue to monitor  COPD -She has long history of tobacco use -She has not been formally diagnosed with COPD, she has not had a PFTs -We will start on inhaled steroids, bronchodilators -If she develops significant wheezing, will likely start IV steroids -At this point, she largely sounds diminished  Permanent atrial fibrillation -Currently rate controlled -Anticoagulated with Xarelto  Hypertension -Currently on Coreg -Lisinopril on hold since plans are to transition to [facility]  Dementia -She is having periods of agitation -Use Ativan as needed  Goals of care -Discussed with patient's daughter who is her POA -She admits that patient would want to be DNR  [employer]

## 2020-12-31 NOTE — Consult Note (Addendum)
Cardiology Consultation:   Patient ID: Rebecca Huerta MRN: [id number]; DOB: 09-27-1945  Admit date: 12/31/2020 Date of Consult: 12/31/2020  PCP:  Delorse Lek, MD   [facility] Providers Cardiologist:  Dina Rich, MD        Patient Profile:   Rebecca Huerta is a 75 y.o. female with a past medical history of permanent atrial fibrillation, HFimpEF (EF25-30% in 2015 and felt to be tachycardia-mediated, normalized to 55-60% by echo in 2016), HTN, dementia and anxiety who is being seen 12/31/2020 for the evaluation of CHF at the request of Dr. Carren Rang.  History of Present Illness:   Rebecca Huerta was examined by Dr. Wyline Mood in 11/2020 for follow-up from her recent Emergency Department visit for worsening dyspnea and volume overload. She had been off diuretic therapy and was restarted on Lasix 20 mg on Monday/Wednesday/Friday. Was also recommended she have a repeat echocardiogram. Her echocardiogram was performed on 12/27/2020 and showed a reduced EF of 40% with diffuse hypokinesis, worse in the basal inferior and inferior septal walls. Was also noted to have mild LVH, normal RV function, moderately elevated PASP and severely dilated left atrium. Also had moderate MR, mild to moderate TR and mild aortic valve sclerosis without stenosis  She presented to [facility] ED during the early morning hours of 12/31/2020 for evaluation of worsening dyspnea over the past 2 days. In talking with the patient and her daughter today, most history is provided by her daughter secondary to her dementia. She lives by herself during the day but typically spends the night with her daughter. Over the past few weeks, she has continued to experience worsening dyspnea on exertion along with some orthopnea. Last night, she reported chest discomfort while lying down on the couch along with worsening dyspnea which prompted them to seek emergent evaluation. Her daughter does help with her medications and feels like she has  been compliant with most of them. Reports her appetite has been diminished and she does not believe she is consuming excess sodium.   Initial labs show WBC 8.9, Hgb 11.8, platelets 246, Na+ 140, K+ 3.4 and creatinine 1.11 (close to baseline). BNP 1405. COVID negative. Initial Hs Troponin 37 with repeat of 34. CXR shows mild CHF with mild chronic interstitial lung disease. EKG shows atrial fibrillation, HR 103 with PVC's and LBBB.   She received IV Lasix 20mg  and is scheduled to receive 20mg  BID. Was continued on PTA Coreg 12.5mg  BID and Lisinopril 20mg  daily.  She reports her breathing has already started to improve and denies any recurrent chest discomfort.  Past Medical History:  Diagnosis Date   A-fib (HCC)    Anxiety    CHF (congestive heart failure) (HCC)    a.  EF was previously 25-30% in 2015 and felt to be tachycardia-mediated b. EF normalized to 55-60% by echo in 2016   Hypertension     Past Surgical History:  Procedure Laterality Date   TUBAL LIGATION       Home Medications:  Prior to Admission medications   Medication Sig Start Date End Date Taking? Authorizing Provider  acetaminophen (TYLENOL) 325 MG tablet Take 2 tablets (650 mg total) by mouth every 6 (six) hours. 04/07/20  Yes Johnson, Clanford L, MD  carvedilol (COREG) 12.5 MG tablet Take 1 tablet (12.5 mg total) by mouth 2 (two) times daily with a meal. 05/18/20  Yes Strader, 2017 M, PA-C  folic acid (FOLVITE) 1 MG tablet TAKE 1 TABLET(1 MG) BY MOUTH DAILY 12/30/20  Yes  Antoine Poche, MD  furosemide (LASIX) 20 MG tablet Take on Monday, Wednesday, and Friday 11/25/20  Yes Branch, Dorothe Pea, MD  lisinopril (ZESTRIL) 20 MG tablet Take 1 tablet (20 mg total) by mouth in the morning and at bedtime. 10/27/20 01/25/21 Yes Strader, Lennart Pall, PA-C  Multiple Vitamin (MULTIVITAMIN) tablet Take 1 tablet by mouth daily.   Yes [provider]  Omega-3 Fatty Acids (FISH OIL) 1000 MG CAPS Take 1 capsule by mouth daily.    Yes [provider]  Rivaroxaban (XARELTO) 15 MG TABS tablet Take 1 tablet (15 mg total) by mouth daily with supper. 04/12/20  Yes Johnson, Clanford L, MD  thiamine 100 MG tablet Take 1 tablet (100 mg total) by mouth daily. 06/09/14  Yes Leone Brand, NP    Inpatient Medications: Scheduled Meds:  carvedilol  12.5 mg Oral BID WC   furosemide  20 mg Intravenous Daily   lisinopril  20 mg Oral Daily   nicotine  21 mg Transdermal Daily   Rivaroxaban  15 mg Oral Q supper   thiamine  100 mg Oral Daily   Continuous Infusions:  potassium chloride 10 mEq (12/31/20 0910)   PRN Meds: acetaminophen **OR** acetaminophen, morphine injection, ondansetron **OR** ondansetron (ZOFRAN) IV, oxyCODONE, polyethylene glycol  Allergies:    Allergies  Allergen Reactions   Penicillins Rash    Social History:   Social History   Socioeconomic History   Marital status: Divorced    Spouse name: Not on file   Number of children: Not on file   Years of education: Not on file   Highest education level: Not on file  Occupational History   Not on file  Tobacco Use   Smoking status: Every Day    Packs/day: 0.50    Types: Cigarettes    Start date: 05/17/1961   Smokeless tobacco: Never  Vaping Use   Vaping Use: Never used  Substance and Sexual Activity   Alcohol use: Yes   Drug use: No   Sexual activity: Not Currently  Other Topics Concern   Not on file  Social History Narrative   Not on file   Social Determinants of Health   Financial Resource Strain: Not on file  Food Insecurity: Not on file  Transportation Needs: Not on file  Physical Activity: Not on file  Stress: Not on file  Social Connections: Not on file  Intimate Partner Violence: Not on file    Family History:   History reviewed. No pertinent family history. Patient unable to contribute to family history secondary to her dementia.   ROS:  Please see the history of present illness.   All other ROS reviewed and  negative.     Physical Exam/Data:   Vitals:   12/31/20 0806 12/31/20 0807 12/31/20 0809 12/31/20 0913  BP:    (!) 147/102  Pulse:    88  Resp:    20  Temp:      TempSrc:      SpO2: 100%  100% 99%  Weight:  59.9 kg    Height:  5\' 4"  (1.626 m)     No intake or output data in the 24 hours ending 12/31/20 0944 Last 3 Weights 12/31/2020 12/31/2020 11/25/2020  Weight (lbs) 132 lb 132 lb 132 lb 3.2 oz  Weight (kg) 59.875 kg 59.875 kg 59.966 kg     Body mass index is 22.66 kg/m.  General:  Well nourished, well developed female appearing in no acute distress.  HEENT: normal  Lymph: no adenopathy Neck: no JVD Endocrine:  No thryomegaly Vascular: No carotid bruits; FA pulses 2+ bilaterally without bruits  Cardiac:  normal S1, S2; Irregularly irregular.  Lungs:  Rales along bases bilaterally. No wheezing.  Abd: soft, nontender, no hepatomegaly  Ext: trace lower extremity edema Musculoskeletal:  No deformities, BUE and BLE strength normal and equal Skin: warm and dry  Neuro:  CNs 2-12 intact, no focal abnormalities noted Psych:  Normal affect. A&Ox2 (person, place).   EKG:  The EKG was personally reviewed and demonstrates: Atrial fibrillation, HR 103 with PVC's and LBBB.  Telemetry:  Telemetry was personally reviewed and demonstrates:  Atrial Fibrillation, HR in 70's to 80's.   Relevant CV Studies:  Echocardiogram: 12/27/2020 IMPRESSIONS     1. The apical viiews are somehat foreshortend There is diffuse  hypokinesis, worse in the basal inferior and inferoseptal walls . Left  ventricular ejection fraction, by estimation, is 40%%. The left ventricle  demonstrates global hypokinesis. The left  ventricular internal cavity size was severely dilated. There is mild left  ventricular hypertrophy. Left ventricular diastolic parameters are  indeterminate.   2. Right ventricular systolic function is normal. The right ventricular  size is normal. There is moderately elevated pulmonary artery  systolic  pressure.   3. Left atrial size was severely dilated.   4. The mitral valve is normal in structure. Moderate mitral valve  regurgitation.   5. Tricuspid valve regurgitation is mild to moderate.   6. The aortic valve is tricuspid. Aortic valve regurgitation is not  visualized. Mild aortic valve sclerosis is present, with no evidence of  aortic valve stenosis.   7. The inferior vena cava is dilated in size with >50% respiratory  variability, suggesting right atrial pressure of 8 mmHg.   Laboratory Data:  High Sensitivity Troponin:   Recent Labs  Lab 12/31/20 0346 12/31/20 0555  TROPONINIHS 37* 34*     Chemistry Recent Labs  Lab 12/31/20 0346  NA 140  K 3.4*  CL 107  CO2 25  GLUCOSE 128*  BUN 19  CREATININE 1.11*  CALCIUM 8.8*  GFRNONAA 52*  ANIONGAP 8    Recent Labs  Lab 12/31/20 0346  PROT 7.1  ALBUMIN 3.2*  AST 33  ALT 35  ALKPHOS 74  BILITOT 0.6   Hematology Recent Labs  Lab 12/31/20 0346  WBC 8.9  RBC 4.35  HGB 11.8*  HCT 39.1  MCV 89.9  MCH 27.1  MCHC 30.2  RDW 17.0*  PLT 246   BNP Recent Labs  Lab 12/31/20 0346  BNP 1,405.0*    DDimer No results for input(s): DDIMER in the last 168 hours.   Radiology/Studies:  DG Chest Port 1 View  Result Date: 12/31/2020 CLINICAL DATA:  Dyspnea EXAM: PORTABLE CHEST 1 VIEW COMPARISON:  11/23/2020, 04/06/2020 FINDINGS: The lungs are symmetrically well expanded. Chronic underlying parenchymal interstitial changes are again identified, however, there are superimposed interstitial infiltrates now present likely representing mild superimposed interstitial pulmonary edema. The cardiac size is mildly enlarged, similar to prior examination, but progressive since 04/06/2020. No pneumothorax or pleural effusion. No acute bone abnormality. IMPRESSION: Mild cardiogenic failure superimposed upon underlying mild chronic interstitial lung disease. Electronically Signed   By: Helyn Numbers MD   On: 12/31/2020  04:05    Assessment and Plan:   1. Acute HFrEF - Her EF was previously 25 to 30% in 2015 but normalized in the interim. Was down to 40% by recent echocardiogram earlier this week. She reports dyspnea  on exertion, orthopnea and lower extremity edema. Has experienced significant improvement in her symptoms since admission with IV Lasix.  - BNP was at 1405 on admission and CXR consistent with CHF. She already received IV Lasix 20 mg x 1.  Will dose 40mg  x1 again today and would reassess in the morning to see if she requires additional IV diuresis. She was on Lasix 20 mg 3 times a week prior to admission and will likely require daily dosing at the time of discharge. - She was on Coreg and Lisinopril prior to admission. Given her cardiomyopathy, will stop Lisinopril. Following a 36-hour washout, would start Entresto 24-26mg  BID which can be titrated as an outpatient.  Would consider adding an SGLT2 inhibitor as an outpatient as well.   2. Atypical chest pain - Her chest discomfort occurred at rest last night and has improved with diuresis. Suspect this is secondary to volume overload. Troponin values have been flat at 37 and 34. - No plans for further ischemic evaluation this admission. Would ultimately plan for a repeat echocardiogram in several months for reassessment of her EF. If this remains reduced, could consider ischemic evaluation at that time but would prefer noninvasive options given her dementia.   3. Permanent Atrial Fibrillation - Her heart rate has been well controlled while in the ED. Continue Coreg 12.5 mg twice daily for rate control.   - No reports of active bleeding. Hemoglobin and platelets stable by recent labs. Continue Xarelto 15mg  daily for anticoagulation which is the appropriate dose at this time given her estimated Creatinine Clearance: 37.8 mL/min (A) (by C-G formula based on SCr of 1.11 mg/dL (H)).  4. HTN - Her BP was elevated at 184/124 upon arrival, improved to 147/102  on most recent check.  She did just receive Coreg.  Will hold Lisinopril for now as outlined above in anticipation of starting Entresto prior to discharge.   Risk Assessment/Risk Scores:       New York Heart Association (NYHA) Functional Class NYHA Class III   For questions or updates, please contact [facility] Please consult [email or web address] for contact info under    Signed, , PA-C  12/31/2020 9:44 AM   Attending note:  Patient seen and examined.  I reviewed her records and discussed the case with the Ellsworth Lennox, I agree with her above findings.  Ms. 01/02/2022 presents to the ER with her daughter in the setting of worsening shortness of breath and recent orthopnea.  She does have dementia at baseline and receives assistance with her medications at home.  History includes tachycardia-mediated cardiomyopathy with normalization of LVEF in the past, but recent echocardiogram showing LVEF down to 40% with global hypokinesis.  On examination she is in no distress.  She has been diuresing following IV Lasix.  She is afebrile, heart rate in the 80s to 90s and atrial fibrillation, blood pressure elevated at presentation.  Lungs exhibit decreased breath sounds at the bases, cardiac exam with irregularly irregular rhythm and no gallop.  No peripheral edema.  Pertinent lab work includes potassium 3.4, BUN 19, creatinine 1.1, BNP 1405, flat high-sensitivity troponin I in the 20s to 30s, hemoglobin 11.8, platelets 246, SARS coronavirus 2 test negative.  Chest x-ray reports chronic interstitial changes with superimposed interstitial edema.  I personally reviewed his ECG which shows atrial fibrillation with left bundle branch block and repolarization abnormalities, PVC.  Acute combined heart failure with recent LVEF 40%.  Patient has a history of tachycardia-mediated cardiomyopathy and has  been on medical therapy as an outpatient.  She is being admitted to the hospitalist service for  IV diuresis.  Plan to continue Coreg, stop lisinopril for 36-hour washout and then start Entresto beginning at 24/26 mg twice daily.  Depending on blood pressure and renal function thereafter would add SGLT2 inhibitor plus minus Aldactone next.  Not clear that she is going to require any further ischemic testing as an inpatient.  Jonelle SidleSamuel G. Jerrold Haskell, M.D., F.A.C.C.

## 2020-12-31 NOTE — H&P (Signed)
TRH H&P    Patient Demographics:    Rebecca Huerta, is a 75 y.o. female  MRN: [id number]  DOB - 03/16/1946  Admit Date - 12/31/2020  Referring MD/NP/PA: Pilar Plate  Outpatient Primary MD for the patient is Delorse Lek, MD  Patient coming from: Home  Chief complaint- Dyspnea   HPI:    Rebecca Huerta  is a 75 y.o. female, with history of A. fib, anxiety, dementia, CHF, tobacco use disorder, and presents the ED with a chief complaint of dyspnea.  Patient has known history of CHF and recently had an echo 4 days ago.  She has not yet followed up with her cardiologist since the echo.  Daughter reports the patient has had increasing her peripheral edema over the last 5 or 6 days.  At home she is on 20 mg of Lasix 3 times weekly-MWF.  She has been compliant with this medication.  Last 2 days she has had an increase in her shortness of breath as well.  This evening the family noticed increased work of breathing, patient nodding off and then waking up gasping for air.  Decided was time to bring her into the hospital.  The ED provider reports that patient was very dyspneic speaking in only 3 or 4 word phrases upon arrival.  She had crackles on exam she was started on nitro, BiPAP, given Lasix and had symptom improvement.  Daughter reports the patient has been complaining of chest pain intermittently for about 24 hours.  Unfortunately patient cannot give details due to her dementia.  The daughter is unsure if the chest pain is exertional, but she knows that the dyspnea is worse with exertion.  Patient is able to ambulate without any assistive device at home.  She has had a decrease in her appetite that is chronic.  Last night for dinner she did have a high salt meal with hamburger and [place] fries.  Is a current smoker and and has a chronic cough.  She did choke on her dinner 2 days ago, and her cough seems to have increased since  then.  Patient is a current smoker.  Chart review reveals that echo July 18 showed an ejection fraction of 40% with global hypokinesis and indeterminate diastolic parameters.  In the ED Temp 97.6, heart rate 89-110, respiratory rate 23-37, blood pressure 150/93, saturating 100% on BiPAP O2 sats has been as low as 80% on room air at arrival No leukocytosis with a white blood cell count of 8.9, hemoglobin 11.8, platelets 246 Chemistry reveals a slightly elevated creatinine at 1.11 that is actually patient's baseline and a slightly decreased potassium 3.4 BNP is 1405 Troponin initially 25 and 37, continue to trend COVID-negative Chest x-ray showed mild cardiogenic failure superimposed upon underlying mild chronic interstitial lung disease EKG shows A. fib with a heart rate of 103, QTc 491 with a left bundle branch block Admission requested for further management of CHF exacerbation    Review of systems:    Unfortunately patient could not participate in review of systems secondary to her  dementia    Past History of the following :    Past Medical History:  Diagnosis Date   A-fib (HCC)    Anxiety    CHF (congestive heart failure) (HCC)    a.  EF was previously 25-30% in 2015 and felt to be tachycardia-mediated b. EF normalized to 55-60% by echo in 2016   Hypertension       Past Surgical History:  Procedure Laterality Date   TUBAL LIGATION        Social History:      Social History   Tobacco Use   Smoking status: Every Day    Packs/day: 0.50    Types: Cigarettes    Start date: 05/17/1961   Smokeless tobacco: Never  Substance Use Topics   Alcohol use: Yes       Family History :    History reviewed. No pertinent family history. Family history hypertension   Home Medications:   Prior to Admission medications   Medication Sig Start Date End Date Taking? Authorizing Provider  acetaminophen (TYLENOL) 325 MG tablet Take 2 tablets (650 mg total) by mouth every 6  (six) hours. 04/07/20   Johnson, Clanford L, MD  carvedilol (COREG) 12.5 MG tablet Take 1 tablet (12.5 mg total) by mouth 2 (two) times daily with a meal. 05/18/20   Strader, [place] M, PA-C  folic acid (FOLVITE) 1 MG tablet TAKE 1 TABLET(1 MG) BY MOUTH DAILY 12/30/20   Antoine Poche, MD  furosemide (LASIX) 20 MG tablet Take on Monday, Wednesday, and Friday 11/25/20   Antoine Poche, MD  lisinopril (ZESTRIL) 20 MG tablet Take 1 tablet (20 mg total) by mouth in the morning and at bedtime. 10/27/20 01/25/21  Ellsworth Lennox, PA-C  Multiple Vitamin (MULTIVITAMIN) tablet Take 1 tablet by mouth daily.    [provider]  Omega-3 1000 MG CAPS Take by mouth.    [provider]  Rivaroxaban (XARELTO) 15 MG TABS tablet Take 1 tablet (15 mg total) by mouth daily with supper. 04/12/20   Johnson, Clanford L, MD  thiamine 100 MG tablet Take 1 tablet (100 mg total) by mouth daily. 06/09/14   Leone Brand, NP     Allergies:     Allergies  Allergen Reactions   Penicillins Rash     Physical Exam:   Vitals  Blood pressure (!) 161/98, pulse (!) 50, temperature 97.6 F (36.4 C), temperature source Oral, resp. rate (!) 21, SpO2 100 %.  1.  General: Patient lying supine in bed,  no acute distress   2. Psychiatric: Alert and oriented x 2, mood and behavior normal for situation, pleasant and cooperative with exam   3. Neurologic: Speech and language are normal, face is symmetric, moves all 4 extremities voluntarily, at baseline without acute deficits on limited exam   4. HEENMT:  Head is atraumatic, normocephalic, pupils reactive to light, neck is supple, trachea is midline, mucous membranes are moist   5. Respiratory : Lungs are clear to auscultation bilaterally without wheezing, rhonchi, rales, no cyanosis, no increase in work of breathing or accessory muscle use   6. Cardiovascular : Heart rate is normal, rhythm is irregularly irregular, no murmurs, rubs or gallops,  no peripheral edema, peripheral pulses palpated   7. Gastrointestinal:  Abdomen is soft, nondistended, nontender to palpation bowel sounds active, no masses or organomegaly palpated   8. Skin:  Skin is warm, dry and intact without rashes, acute lesions, or ulcers on limited exam  9.Musculoskeletal:  No acute deformities or trauma, no asymmetry in tone, no peripheral edema, peripheral pulses palpated, no tenderness to palpation in the extremities     Data Review:    CBC Recent Labs  Lab 12/31/20 0346  WBC 8.9  HGB 11.8*  HCT 39.1  PLT 246  MCV 89.9  MCH 27.1  MCHC 30.2  RDW 17.0*   ------------------------------------------------------------------------------------------------------------------  Results for orders placed or performed during the hospital encounter of 12/31/20 (from the past 48 hour(s))  CBC     Status: Abnormal   Collection Time: 12/31/20  3:46 AM  Result Value Ref Range   WBC 8.9 4.0 - 10.5 K/uL   RBC 4.35 3.87 - 5.11 MIL/uL   Hemoglobin 11.8 (L) 12.0 - 15.0 g/dL   HCT 77.9 39.0 - 30.0 %   MCV 89.9 80.0 - 100.0 fL   MCH 27.1 26.0 - 34.0 pg   MCHC 30.2 30.0 - 36.0 g/dL   RDW 92.3 (H) 30.0 - 76.2 %   Platelets 246 150 - 400 K/uL   nRBC 0.0 0.0 - 0.2 %    Comment: Performed at [facility], [address], [place], [place] [address]  Comprehensive metabolic panel     Status: Abnormal   Collection Time: 12/31/20  3:46 AM  Result Value Ref Range   Sodium 140 135 - 145 mmol/L   Potassium 3.4 (L) 3.5 - 5.1 mmol/L   Chloride 107 98 - 111 mmol/L   CO2 25 22 - 32 mmol/L   Glucose, Bld 128 (H) 70 - 99 mg/dL    Comment: Glucose reference range applies only to samples taken after fasting for at least 8 hours.   BUN 19 8 - 23 mg/dL   Creatinine, Ser 5.45 (H) 0.44 - 1.00 mg/dL   Calcium 8.8 (L) 8.9 - 10.3 mg/dL   Total Protein 7.1 6.5 - 8.1 g/dL   Albumin 3.2 (L) 3.5 - 5.0 g/dL   AST 33 15 - 41 U/L   ALT 35 0 - 44 U/L   Alkaline Phosphatase 74 38 - 126  U/L   Total Bilirubin 0.6 0.3 - 1.2 mg/dL   GFR, Estimated 52 (L) >60 mL/min    Comment: (NOTE) Calculated using the CKD-EPI Creatinine Equation (2021)    Anion gap 8 5 - 15    Comment: Performed at [facility], [address] [place], [place] [address]  Brain natriuretic peptide     Status: Abnormal   Collection Time: 12/31/20  3:46 AM  Result Value Ref Range   B Natriuretic Peptide 1,405.0 (H) 0.0 - 100.0 pg/mL    Comment: Performed at [facility], [address] [place], [place] [address]  Troponin I (High Sensitivity)     Status: Abnormal   Collection Time: 12/31/20  3:46 AM  Result Value Ref Range   Troponin I (High Sensitivity) 37 (H) <18 ng/L    Comment: (NOTE) Elevated high sensitivity troponin I (hsTnI) values and significant  changes across serial measurements may suggest ACS but many other  chronic and acute conditions are known to elevate hsTnI results.  Refer to the "Links" section for chest pain algorithms and additional  guidance. Performed at [facility], [address], [place], [place] [address]   Resp Panel by RT-PCR (Flu A&B, Covid) Nasopharyngeal Swab     Status: None   Collection Time: 12/31/20  3:50 AM   Specimen: Nasopharyngeal Swab; Nasopharyngeal(NP) swabs in vial transport medium  Result Value Ref Range   SARS  Coronavirus 2 by RT PCR NEGATIVE NEGATIVE    Comment: (NOTE) SARS-CoV-2 target nucleic acids are NOT DETECTED.  The SARS-CoV-2 RNA is generally detectable in upper respiratory specimens during the acute phase of infection. The lowest concentration of SARS-CoV-2 viral copies this assay can detect is 138 copies/mL. A negative result does not preclude SARS-Cov-2 infection and should not be used as the sole basis for treatment or other patient management decisions. A negative result may occur with  improper specimen collection/handling, submission of specimen other than nasopharyngeal swab, presence of viral mutation(s) within the areas targeted  by this assay, and inadequate number of viral copies(<138 copies/mL). A negative result must be combined with clinical observations, patient history, and epidemiological information. The expected result is Negative.  Fact Sheet for Patients:  BloggerCourse.com  Fact Sheet for Healthcare Providers:  SeriousBroker.it  This test is no t yet approved or cleared by the [place] FDA and  has been authorized for detection and/or diagnosis of SARS-CoV-2 by FDA under an Emergency Use Authorization (EUA). This EUA will remain  in effect (meaning this test can be used) for the duration of the COVID-19 declaration under Section 564(b)(1) of the Act, 21 U.S.C.section 360bbb-3(b)(1), unless the authorization is terminated  or revoked sooner.       Influenza A by PCR NEGATIVE NEGATIVE   Influenza B by PCR NEGATIVE NEGATIVE    Comment: (NOTE) The Xpert Xpress SARS-CoV-2/FLU/RSV plus assay is intended as an aid in the diagnosis of influenza from Nasopharyngeal swab specimens and should not be used as a sole basis for treatment. Nasal washings and aspirates are unacceptable for Xpert Xpress SARS-CoV-2/FLU/RSV testing.  Fact Sheet for Patients: BloggerCourse.com  Fact Sheet for Healthcare Providers: SeriousBroker.it  This test is not yet approved or cleared by the [place] FDA and has been authorized for detection and/or diagnosis of SARS-CoV-2 by FDA under an Emergency Use Authorization (EUA). This EUA will remain in effect (meaning this test can be used) for the duration of the COVID-19 declaration under Section 564(b)(1) of the Act, 21 U.S.C. section 360bbb-3(b)(1), unless the authorization is terminated or revoked.  Performed at [facility], [address], [place], [place] [address]     Chemistries  Recent Labs  Lab 12/31/20 0346  NA 140  K 3.4*  CL 107  CO2 25   GLUCOSE 128*  BUN 19  CREATININE 1.11*  CALCIUM 8.8*  AST 33  ALT 35  ALKPHOS 74  BILITOT 0.6   ------------------------------------------------------------------------------------------------------------------  ------------------------------------------------------------------------------------------------------------------ GFR: CrCl cannot be calculated (Unknown ideal weight.). Liver Function Tests: Recent Labs  Lab 12/31/20 0346  AST 33  ALT 35  ALKPHOS 74  BILITOT 0.6  PROT 7.1  ALBUMIN 3.2*   No results for input(s): LIPASE, AMYLASE in the last 168 hours. No results for input(s): AMMONIA in the last 168 hours. Coagulation Profile: No results for input(s): INR, PROTIME in the last 168 hours. Cardiac Enzymes: No results for input(s): CKTOTAL, CKMB, CKMBINDEX, TROPONINI in the last 168 hours. BNP (last 3 results) No results for input(s): PROBNP in the last 8760 hours. HbA1C: No results for input(s): HGBA1C in the last 72 hours. CBG: No results for input(s): GLUCAP in the last 168 hours. Lipid Profile: No results for input(s): CHOL, HDL, LDLCALC, TRIG, CHOLHDL, LDLDIRECT in the last 72 hours. Thyroid Function Tests: No results for input(s): TSH, T4TOTAL, FREET4, T3FREE, THYROIDAB in the last 72 hours. Anemia Panel: No results for input(s): VITAMINB12, FOLATE, FERRITIN, TIBC, IRON, RETICCTPCT in the  last 72 hours.  --------------------------------------------------------------------------------------------------------------- Urine analysis: No results found for: COLORURINE, APPEARANCEUR, LABSPEC, PHURINE, GLUCOSEU, HGBUR, BILIRUBINUR, KETONESUR, PROTEINUR, UROBILINOGEN, NITRITE, LEUKOCYTESUR    Imaging Results:    DG Chest Port 1 View  Result Date: 12/31/2020 CLINICAL DATA:  Dyspnea EXAM: PORTABLE CHEST 1 VIEW COMPARISON:  11/23/2020, 04/06/2020 FINDINGS: The lungs are symmetrically well expanded. Chronic underlying parenchymal interstitial changes are again  identified, however, there are superimposed interstitial infiltrates now present likely representing mild superimposed interstitial pulmonary edema. The cardiac size is mildly enlarged, similar to prior examination, but progressive since 04/06/2020. No pneumothorax or pleural effusion. No acute bone abnormality. IMPRESSION: Mild cardiogenic failure superimposed upon underlying mild chronic interstitial lung disease. Electronically Signed   By: Helyn NumbersAshesh  Parikh MD   On: 12/31/2020 04:05      Assessment & Plan:    Active Problems:   A-fib (HCC)   Tobacco abuse   Benign essential HTN   Hypokalemia   CHF exacerbation (HCC)   CHF exacerbation With crackles and signs of heart failure on chest x-ray BNP 1405 Troponins mildly elevated likely from demand ischemia with troponin 25 and 37 continue to trend Last echo was July 18, repeat will not be done Consult cardiology for any recommendations for medication changes with regards to last echo Monitor intake and output, daily weights, fluid restrictions Continue Coreg, lisinopril Continue IV Lasix Atrial fibrillation Currently rate controlled Continue Xarelto Tobacco abuse disorder Advised on importance of cessation Nicotine patch ordered Hypokalemia Replace and recheck Anticipate that it could drop lower given IV Lasix so we will recheck this afternoon Hypertension Continue lisinopril   DVT Prophylaxis-   Xarelto- SCDs   AM Labs Ordered, also please review Full Orders  Family Communication: Admission, patients condition and plan of care including tests being ordered have been discussed with the patient and daughter who indicate understanding and agree with the plan and Code Status.  Code Status: Full  Admission status: Observation  Time spent in minutes : 65   Maicee Ullman B Zierle-Ghosh DO

## 2020-12-31 NOTE — ED Provider Notes (Signed)
[facility]-EMERGENCY DEPT [facility] Emergency Department Provider Note MRN:  [id number]  Arrival date & time: 12/31/20     Chief Complaint   Shortness of Breath   History of Present Illness   Rebecca Huerta is a 75 y.o. year-old female with a history of A. fib, CHF presenting to the ED with chief complaint of shortness of breath.  Gradual onset shortness of breath progressively worsening over the past 2 days, much worse this evening, prompting ED evaluation.  Patient denies any chest pain, no recent fever or cough.  Review of Systems  Positive for shortness of breath.  Patient's Health History    Past Medical History:  Diagnosis Date   A-fib (HCC)    Anxiety    CHF (congestive heart failure) (HCC)    a.  EF was previously 25-30% in 2015 and felt to be tachycardia-mediated b. EF normalized to 55-60% by echo in 2016   Hypertension     Past Surgical History:  Procedure Laterality Date   TUBAL LIGATION      History reviewed. No pertinent family history.  Social History   Socioeconomic History   Marital status: Divorced    Spouse name: Not on file   Number of children: Not on file   Years of education: Not on file   Highest education level: Not on file  Occupational History   Not on file  Tobacco Use   Smoking status: Every Day    Packs/day: 0.50    Types: Cigarettes    Start date: 05/17/1961   Smokeless tobacco: Never  Vaping Use   Vaping Use: Never used  Substance and Sexual Activity   Alcohol use: Yes   Drug use: No   Sexual activity: Not Currently  Other Topics Concern   Not on file  Social History Narrative   Not on file   Social Determinants of Health   Financial Resource Strain: Not on file  Food Insecurity: Not on file  Transportation Needs: Not on file  Physical Activity: Not on file  Stress: Not on file  Social Connections: Not on file  Intimate Partner Violence: Not on file     Physical Exam   Vitals:   12/31/20 0500 12/31/20 0530  BP: (!)  159/100 (!) 161/98  Pulse: 97 (!) 50  Resp: (!) 24 (!) 21  Temp:    SpO2: 100% 100%    CONSTITUTIONAL: Chronically ill-appearing, in moderate respiratory distress NEURO:  Alert and oriented x 3, no focal deficits EYES:  eyes equal and reactive ENT/NECK:  no LAD, no JVD CARDIO: Tachycardic rate, well-perfused, normal S1 and S2 PULM: Scattered wheezing and rhonchi, tachypneic, accessory muscle use GI/GU:  normal bowel sounds, non-distended, non-tender MSK/SPINE:  No gross deformities, no edema SKIN:  no rash, atraumatic PSYCH:  Appropriate speech and behavior  *Additional and/or pertinent findings included in MDM below  Diagnostic and Interventional Summary    EKG Interpretation  Date/Time:  Friday December 31 2020 03:46:57 EDT Ventricular Rate:  103 PR Interval:    QRS Duration: 126 QT Interval:  375 QTC Calculation: 491 R Axis:   -62 Text Interpretation: Atrial fibrillation Ventricular premature complex Left bundle branch block Baseline wander in lead(s) V4 No significant change was found Confirmed by Kennis Carina [phone]) on 12/31/2020 4:00:36 AM       Labs Reviewed  CBC - Abnormal; Notable for the following components:      Result Value   Hemoglobin 11.8 (*)    RDW 17.0 (*)  All other components within normal limits  COMPREHENSIVE METABOLIC PANEL - Abnormal; Notable for the following components:   Potassium 3.4 (*)    Glucose, Bld 128 (*)    Creatinine, Ser 1.11 (*)    Calcium 8.8 (*)    Albumin 3.2 (*)    GFR, Estimated 52 (*)    All other components within normal limits  BRAIN NATRIURETIC PEPTIDE - Abnormal; Notable for the following components:   B Natriuretic Peptide 1,405.0 (*)    All other components within normal limits  TROPONIN I (HIGH SENSITIVITY) - Abnormal; Notable for the following components:   Troponin I (High Sensitivity) 37 (*)    All other components within normal limits  TROPONIN I (HIGH SENSITIVITY) - Abnormal; Notable for the following  components:   Troponin I (High Sensitivity) 34 (*)    All other components within normal limits  RESP PANEL BY RT-PCR (FLU A&B, COVID) [id number]    DG Chest Port 1 View  Final Result      Medications  potassium chloride 10 mEq in 100 mL IVPB (10 mEq Intravenous New Bag/Given 12/31/20 0649)  nicotine (NICODERM CQ - dosed in mg/24 hours) patch 21 mg (has no administration in time range)  nitroGLYCERIN (NITROSTAT) SL tablet 0.4 mg (0.4 mg Sublingual Given 12/31/20 0352)  furosemide (LASIX) injection 20 mg (20 mg Intravenous Given 12/31/20 0546)     Procedures  /  Critical Care .Critical Care  Date/Time: 12/31/2020 4:00 AM Performed by: Sabas Sous, MD Authorized by: Sabas Sous, MD   Critical care provider statement:    Critical care time (minutes):  37   Critical care was necessary to treat or prevent imminent or life-threatening deterioration of the following conditions:  Respiratory failure   Critical care was time spent personally by me on the following activities:  Discussions with consultants, evaluation of patient's response to treatment, examination of patient, ordering and performing treatments and interventions, ordering and review of laboratory studies, ordering and review of radiographic studies, pulse oximetry, re-evaluation of patient's condition, obtaining history from patient or surrogate and review of old charts  ED Course and Medical Decision Making  I have reviewed the triage vital signs, the nursing notes, and pertinent available records from the EMR.  Listed above are laboratory and imaging tests that I personally ordered, reviewed, and interpreted and then considered in my medical decision making (see below for details).  Respiratory distress, saturations 80% on room air, improving on 3 L nasal cannula.  History of CHF, suspect exacerbation.  Patient is hypertensive 180s over 120s, providing sublingual nitrogen.  Trialing BiPAP, chest x-ray showing evidence of  pulmonary edema.     Patient much improved, admitting to hospital service.  Elmer Sow. Pilar Plate, MD [facility] Emergency Medicine [facility] [email or web address]  Final Clinical Impressions(s) / ED Diagnoses     ICD-10-CM   1. Acute on chronic congestive heart failure, unspecified heart failure type (HCC)  I50.9       ED Discharge Orders     None        Discharge Instructions Discussed with and Provided to Patient:   Discharge Instructions   None       Sabas Sous, MD 12/31/20 [phone]

## 2020-12-31 NOTE — Evaluation (Signed)
Clinical/Bedside Swallow Evaluation Patient Details  Name: Rebecca Huerta MRN: [id number] Date of Birth: 21-Nov-1945  Today's Date: 12/31/2020 Time: SLP Start Time (ACUTE ONLY): 1035 SLP Stop Time (ACUTE ONLY): 1053 SLP Time Calculation (min) (ACUTE ONLY): 18 min  Past Medical History:  Past Medical History:  Diagnosis Date   A-fib (HCC)    Anxiety    CHF (congestive heart failure) (HCC)    a.  EF was previously 25-30% in 2015 and felt to be tachycardia-mediated b. EF normalized to 55-60% by echo in 2016   Hypertension    Past Surgical History:  Past Surgical History:  Procedure Laterality Date   TUBAL LIGATION     HPI:  Rebecca Huerta  is a 75 y.o. female, with history of A. fib, anxiety, dementia, CHF, tobacco use disorder, and presents the ED with a chief complaint of dyspnea.  Patient has known history of CHF and recently had an echo 4 days ago.  She has not yet followed up with her cardiologist since the echo.  Daughter reports the patient has had increasing her peripheral edema over the last 5 or 6 days.  At home she is on 20 mg of Lasix 3 times weekly-MWF.  She has been compliant with this medication.  Last 2 days she has had an increase in her shortness of breath as well.  This evening the family noticed increased work of breathing, patient nodding off and then waking up gasping for air.  Decided was time to bring her into the hospital.  The ED provider reports that patient was very dyspneic speaking in only 3 or 4 word phrases upon arrival.  She had crackles on exam she was started on nitro, BiPAP, given Lasix and had symptom improvement.  Daughter reports the patient has been complaining of chest pain intermittently for about 24 hours.  Unfortunately patient cannot give details due to her dementia.  The daughter is unsure if the chest pain is exertional, but she knows that the dyspnea is worse with exertion.  Patient is able to ambulate without any assistive device at home.  She has had a  decrease in her appetite that is chronic.  Last night for dinner she did have a high salt meal with hamburger and [place] fries.  Is a current smoker and and has a chronic cough.  She did choke on her dinner 2 days ago, and her cough seems to have increased since then. BSE ordered   Assessment / Plan / Recommendation Clinical Impression  Clinical swallowing evaluation completed while Pt was sitting upright in bed; Pt consumed thin liquids and regular textures without overt s/sx of aspiration. SLP reviewed universal aspiration precautions with the Pt and Pt's daughter, present for evaluation. Recommend continue with regular diet and thin liquids; there are no further ST needs noted at this time, ST will sign off. Thank you for this referral, SLP Visit Diagnosis: Dysphagia, unspecified (R13.10)    Aspiration Risk       Diet Recommendation Regular;Thin liquid   Liquid Administration via: Cup;Straw Medication Administration: Whole meds with liquid Supervision: Patient able to Huerta feed;Intermittent supervision to cue for compensatory strategies Compensations: Minimize environmental distractions;Slow rate;Small sips/bites Postural Changes: Seated upright at 90 degrees    Other  Recommendations Oral Care Recommendations: Oral care BID     Swallow Study   General HPI: Rebecca Huerta  is a 75 y.o. female, with history of A. fib, anxiety, dementia, CHF, tobacco use disorder, and presents the ED with a chief  complaint of dyspnea.  Patient has known history of CHF and recently had an echo 4 days ago.  She has not yet followed up with her cardiologist since the echo.  Daughter reports the patient has had increasing her peripheral edema over the last 5 or 6 days.  At home she is on 20 mg of Lasix 3 times weekly-MWF.  She has been compliant with this medication.  Last 2 days she has had an increase in her shortness of breath as well.  This evening the family noticed increased work of breathing, patient nodding  off and then waking up gasping for air.  Decided was time to bring her into the hospital.  The ED provider reports that patient was very dyspneic speaking in only 3 or 4 word phrases upon arrival.  She had crackles on exam she was started on nitro, BiPAP, given Lasix and had symptom improvement.  Daughter reports the patient has been complaining of chest pain intermittently for about 24 hours.  Unfortunately patient cannot give details due to her dementia.  The daughter is unsure if the chest pain is exertional, but she knows that the dyspnea is worse with exertion.  Patient is able to ambulate without any assistive device at home.  She has had a decrease in her appetite that is chronic.  Last night for dinner she did have a high salt meal with hamburger and [place] fries.  Is a current smoker and and has a chronic cough.  She did choke on her dinner 2 days ago, and her cough seems to have increased since then. BSE ordered Type of Study: Bedside Swallow Evaluation Previous Swallow Assessment: none in chart Diet Prior to this Study: Regular;Thin liquids Temperature Spikes Noted: No Respiratory Status: Room air History of Recent Intubation: No Behavior/Cognition: Alert;Cooperative Oral Cavity Assessment: Within Functional Limits Oral Cavity - Dentition: Missing dentition;Adequate natural dentition Vision: Functional for Huerta-feeding Huerta-Feeding Abilities: Able to feed Huerta Patient Positioning: Upright in bed Baseline Vocal Quality: Normal Volitional Cough: Strong Volitional Swallow: Able to elicit    Oral/Motor/Sensory Function Overall Oral Motor/Sensory Function: Within functional limits   Ice Chips Ice chips: Within functional limits   Thin Liquid Thin Liquid: Within functional limits    Nectar Thick Nectar Thick Liquid: Not tested   Honey Thick Honey Thick Liquid: Not tested   Puree Puree: Within functional limits   Solid     Solid: Within functional limits     Othmar Ringer H. Romie Levee,  CCC-SLP Speech Language Pathologist  Georgetta Haber 12/31/2020,11:03 AM

## 2020-12-31 NOTE — TOC Initial Note (Signed)
Transition of Care [facility]) - Initial/Assessment Note    Patient Details  Name: Rebecca Huerta MRN: [id number] Date of Birth: April 09, 1946  [facility] CM/SW Contact:    Villa Herb, LCSWA Phone Number: 12/31/2020, 9:01 AM  Clinical Narrative:                 [facility] consulted for CHF screen. CSW spoke with pt and her daughter [name] in room to complete assessment. Pt lives alone. Pt is able to complete her ADLs independently. Pt does drive but daughter feels she shouldn't. Pt was set up with [facility] in the past however she refused to let them in the home. Pt daughter feels that [facility] is needed. CSW explained that we can attempt to set up services. However, if we are unable to find a company to accept pt they will need to reach out to pts PCP to set up services. Pt has a walker to use when needed. Pt is wearing O2 in the ED however, pts daughter states pt will not stop smoking in the home so home O2 would not be safe for pt.   CSW completed CHF screen with pts daughter due to pts confusion. Pt does not weigh daily. CSW educated pt and daughter on importance of this. Pt does not follow a heart healthy diet. CSW educated them on importance of a low salt and low fat diet. Pts daughter sets up her medications though she is unsure if they are all taken correctly. Pts cardiologist is Dr. Wyline Mood. TOC to follow.   Expected Discharge Plan: Home w Home Health Services Barriers to Discharge: Continued Medical Work up   Patient Goals and CMS Choice Patient states their goals for this hospitalization and ongoing recovery are:: Home with [facility] CMS Medicare.gov Compare Post Acute Care list provided to:: Patient Represenative (must comment) (Daughter [name]) Choice offered to / list presented to : Adult Children  Expected Discharge Plan and Services Expected Discharge Plan: Home w Home Health Services In-house Referral: Clinical Social Work Discharge Planning Services: CM Consult Post Acute Care Choice:  Home Health Living arrangements for the past 2 months: Single Family Home                                      Prior Living Arrangements/Services Living arrangements for the past 2 months: Single Family Home Lives with:: Self Patient language and need for interpreter reviewed:: Yes Do you feel safe going back to the place where you live?: Yes      Need for Family Participation in Patient Care: Yes (Comment) Care giver support system in place?: Yes (comment) Current home services: DME Criminal Activity/Legal Involvement Pertinent to Current Situation/Hospitalization: No - Comment as needed  Activities of Daily Living      Permission Sought/Granted                  Emotional Assessment Appearance:: Appears stated age Attitude/Demeanor/Rapport: Unable to Assess Affect (typically observed): Unable to Assess Orientation: : Fluctuating Orientation (Suspected and/or reported Sundowners) Alcohol / Substance Use: Not Applicable Psych Involvement: No (comment)  Admission diagnosis:  CHF exacerbation (HCC) [I50.9] Patient Active Problem List   Diagnosis Date Noted   CHF exacerbation (HCC) 12/31/2020   Scalp laceration 04/07/2020   Leukocytosis 04/07/2020   History of CHF (congestive heart failure) 04/07/2020   Lumbar burst fracture (HCC) 04/06/2020   Cardiomyopathy (HCC) 06/09/2014   Chronic  systolic heart failure (HCC) 06/09/2014   Malnutrition (HCC) 06/09/2014   Rash 06/09/2014   Anticoagulation adequate 06/09/2014   Pulmonary hypertension (HCC)    Acute on chronic systolic CHF (congestive heart failure) (HCC) 05/12/2014   Benign essential HTN 05/12/2014   Anxiety state 05/12/2014   Atrial fibrillation with RVR (HCC) 05/12/2014   Postoperative pulmonary edema (HCC) 05/12/2014   Pleural effusion 05/12/2014   Hypokalemia 05/12/2014   Hyponatremia 05/12/2014   A-fib (HCC) 05/11/2014   Edema leg 05/11/2014   Pulmonary edema 05/11/2014   Tobacco abuse 05/11/2014    Alcohol abuse 05/11/2014   PCP:  Delorse Lek, MD Pharmacy:   [facility] [phone] - [place], [place] - [address] AT [facility] [address] [place] [address] Phone: [phone] Fax: [phone]     Social Determinants of Health (SDOH) Interventions    Readmission Risk Interventions No flowsheet data found.

## 2020-12-31 NOTE — ED Triage Notes (Signed)
Pt BIB POV c/o SOB. Hx CHF

## 2021-01-01 DIAGNOSIS — I5041 Acute combined systolic (congestive) and diastolic (congestive) heart failure: Secondary | ICD-10-CM

## 2021-01-01 LAB — CBC
HCT: 36.3 % (ref 36.0–46.0)
Hemoglobin: 10.7 g/dL — ABNORMAL LOW (ref 12.0–15.0)
MCH: 26.7 pg (ref 26.0–34.0)
MCHC: 29.5 g/dL — ABNORMAL LOW (ref 30.0–36.0)
MCV: 90.5 fL (ref 80.0–100.0)
Platelets: 193 10*3/uL (ref 150–400)
RBC: 4.01 MIL/uL (ref 3.87–5.11)
RDW: 16.5 % — ABNORMAL HIGH (ref 11.5–15.5)
WBC: 7.7 10*3/uL (ref 4.0–10.5)
nRBC: 0 % (ref 0.0–0.2)

## 2021-01-01 LAB — COMPREHENSIVE METABOLIC PANEL
ALT: 30 U/L (ref 0–44)
AST: 28 U/L (ref 15–41)
Albumin: 2.8 g/dL — ABNORMAL LOW (ref 3.5–5.0)
Alkaline Phosphatase: 64 U/L (ref 38–126)
Anion gap: 8 (ref 5–15)
BUN: 20 mg/dL (ref 8–23)
CO2: 29 mmol/L (ref 22–32)
Calcium: 8.6 mg/dL — ABNORMAL LOW (ref 8.9–10.3)
Chloride: 105 mmol/L (ref 98–111)
Creatinine, Ser: 1.1 mg/dL — ABNORMAL HIGH (ref 0.44–1.00)
GFR, Estimated: 52 mL/min — ABNORMAL LOW (ref >60)
Glucose, Bld: 92 mg/dL (ref 70–99)
Potassium: 3.9 mmol/L (ref 3.5–5.1)
Sodium: 142 mmol/L (ref 135–145)
Total Bilirubin: 0.8 mg/dL (ref 0.3–1.2)
Total Protein: 5.9 g/dL — ABNORMAL LOW (ref 6.5–8.1)

## 2021-01-01 LAB — MAGNESIUM: Magnesium: 2 mg/dL (ref 1.7–2.4)

## 2021-01-01 MED ORDER — ALPRAZOLAM 0.25 MG PO TABS
0.2500 mg | ORAL_TABLET | Freq: Three times a day (TID) | ORAL | Status: DC
  Administered 2021-01-01 (×3): 0.25 mg via ORAL
  Filled 2021-01-01 (×4): qty 1

## 2021-01-01 MED ORDER — CARVEDILOL 12.5 MG PO TABS
25.0000 mg | ORAL_TABLET | Freq: Two times a day (BID) | ORAL | Status: DC
  Administered 2021-01-01: 25 mg via ORAL
  Administered 2021-01-01: 12.5 mg via ORAL
  Administered 2021-01-02 – 2021-01-05 (×7): 25 mg via ORAL
  Filled 2021-01-01 (×9): qty 2

## 2021-01-01 MED ORDER — SACUBITRIL-VALSARTAN 24-26 MG PO TABS
1.0000 | ORAL_TABLET | Freq: Two times a day (BID) | ORAL | Status: DC
  Administered 2021-01-01 – 2021-01-05 (×8): 1 via ORAL
  Filled 2021-01-01 (×9): qty 1

## 2021-01-01 NOTE — Progress Notes (Signed)
PROGRESS NOTE    Patient: Rebecca Huerta                            PCP: Delorse Lek, MD                    DOB: 22-Jul-1945            DOA: 12/31/2020 RXV:[id number]             DOS: 01/01/2021, 12:04 PM   LOS: 1 day   Date of Service: The patient was seen and examined on 01/01/2021  Subjective:   The patient was seen and examined this morning. Stable at this time. Still complaining of :  Otherwise no issues overnight .  Brief Narrative:   Rebecca Huerta  is a 75 y.o. female, with history of A. fib, anxiety, dementia, CHF, tobacco use disorder, and presents the ED with a chief complaint of dyspnea.   Patient has known history of CHF and recently had an echo 4 days ago.   Daughter reports the patient has had increasing her peripheral edema over the last 5 or 6 days.  At home she is on 20 mg of Lasix 3 times weekly-MWF.  She has been compliant with this medication.  Last 2 days she has had an increase in her shortness of breath as well.  This evening the family noticed increased work of breathing, patient nodding off and then waking up gasping for air.     Assessment & Plan:   Active Problems:   A-fib (HCC)   Tobacco abuse   Acute on chronic systolic CHF (congestive heart failure) (HCC)   Benign essential HTN   Hypokalemia   Pulmonary hypertension (HCC)   CHF exacerbation (HCC)   Acute CHF (congestive heart failure) (HCC)   Acute respiratory failure with hypoxia (HCC)    Acute on chronic combined systolic-diastolic CHF. -Last echo reviewed, EF 40% -Plan is to continue IV diuresis for another 24 hours -Continue on Coreg - Entresto starting today per cardiology request -Appreciate cardiology --- recommended continue diuretics, initiating Entresto 24/26 mg twice daily (Discontinued lisinopril   Acute respiratory failure with hypoxia -Due to CHF/COPD -Patient required BiPAP on admission -She has not been weaned down to nasal cannula -Continue to monitor -Daughter not O2  dependent at home, currently on 3 L of oxygen, satting on percent  COPD -She has long history of tobacco use--refusing to quit -She has not been formally diagnosed with COPD, she has not had a PFTs -We will start on inhaled steroids, bronchodilators -If she develops significant wheezing, will likely start IV steroids--we will taper down slowly -Still requiring 3 L of oxygen, currently satting 100% (goal to maintain 88-92%)   Permanent atrial fibrillation -Currently rate controlled -Anticoagulated with Xarelto   Hypertension -Currently on Coreg -Discontinued lisinopril,  -Initiating Entresto 24/26 mg p.o. twice daily today 01/01/2021   Dementia -She is having periods of agitation -Use Ativan as needed -Initiate scheduled Xanax 0.25 mg p.o. 3 times daily    Goals of care -Discussed with patient's daughter who is her POA at bedside -She admits that patient would want to be DNR ---------------------------------------------------------------------------------------------------------------------------------------------- Cultures; None   Antimicrobials: None    Consultants: Cardiology   ------------------------------------------------------------------------------------------------------------------------------------------------  DVT prophylaxis:  JK:[id number]  Code Status:   Code Status: DNR  Family Communication: No family member present at bedside- attempt will be made to update daily The above findings and plan  of care has been discussed with patient (and family)  in detail,  they expressed understanding and agreement of above. -Advance care planning has been discussed.   Admission status:   Status is: Inpatient  Remains inpatient appropriate because:Hemodynamically unstable and Inpatient level of care appropriate due to severity of illness  Dispo: The patient is from: Home              Anticipated d/c is to: Home to her daughter in next 2-3 days              Patient  currently is not medically stable to d/c.   Difficult to place patient No      Level of care: Telemetry   Procedures:   No admission procedures for hospital encounter.    Antimicrobials:  Anti-infectives (From admission, onward)    None        Medication:   ALPRAZolam  0.25 mg Oral TID   budesonide (PULMICORT) nebulizer solution  0.25 mg Nebulization BID   carvedilol  25 mg Oral BID WC   furosemide  40 mg Intravenous Daily   guaiFENesin  1,200 mg Oral BID   ipratropium-albuterol  3 mL Nebulization Q6H WA   nicotine  21 mg Transdermal Daily   Rivaroxaban  15 mg Oral Q supper   sacubitril-valsartan  1 tablet Oral BID   thiamine  100 mg Oral Daily    acetaminophen **OR** acetaminophen, LORazepam, morphine injection, ondansetron **OR** ondansetron (ZOFRAN) IV, oxyCODONE, polyethylene glycol   Objective:   Vitals:   01/01/21 0513 01/01/21 0800 01/01/21 0845 01/01/21 0903  BP: (!) 173/109 (!) 163/116 (!) 163/116 (!) 166/166  Pulse: 89  89 89  Resp: 19     Temp: 98.2 F (36.8 C)     TempSrc: Oral     SpO2: 97%  100%   Weight: 59.3 kg     Height:        Intake/Output Summary (Last 24 hours) at 01/01/2021 1204 Last data filed at 01/01/2021 0500 Gross per 24 hour  Intake --  Output 900 ml  Net -900 ml   Filed Weights   12/31/20 0807 12/31/20 2200 01/01/21 0513  Weight: 59.9 kg 59.9 kg 59.3 kg     Examination:   Physical Exam  Constitution: Awake alert following command, on 3 L of oxygen this morning Psychiatric: Stable mood, flat affect, somewhat withdrawn HEENT: Normocephalic, PERRL, otherwise with in Normal limits  Chest:Chest symmetric Cardio vascular:  S1/S2, RRR, No murmure, No Rubs or Gallops  pulmonary: Clear to auscultation bilaterally, respirations unlabored, negative wheezes / crackles Abdomen: Soft, non-tender, non-distended, bowel sounds,no masses, no organomegaly Muscular skeletal: Global generalized weakness Limited exam - in bed, able  to move all 4 extremities, Normal strength,  Neuro: CNII-XII intact. , normal motor and sensation, reflexes intact  Extremities: No pitting edema lower extremities, +2 pulses  Skin: Dry, warm to touch, negative for any Rashes, No open wounds Wounds: per nursing documentation    ------------------------------------------------------------------------------------------------------------------------------------------    LABs:  CBC Latest Ref Rng & Units 01/01/2021 12/31/2020 11/23/2020  WBC 4.0 - 10.5 K/uL 7.7 8.9 7.9  Hemoglobin 12.0 - 15.0 g/dL 10.7(L) 11.8(L) 12.6  Hematocrit 36.0 - 46.0 % 36.3 39.1 41.0  Platelets 150 - 400 K/uL 193 246 220   CMP Latest Ref Rng & Units 01/01/2021 12/31/2020 12/16/2020  Glucose 70 - 99 mg/dL 92 161(W128(H) 960(A125(H)  BUN 8 - 23 mg/dL 20 19 14   Creatinine 0.44 - 1.00 mg/dL 5.40(J1.10(H) 8.11(B1.11(H)  1.15(H)  Sodium 135 - 145 mmol/L 142 140 137  Potassium 3.5 - 5.1 mmol/L 3.9 3.4(L) 3.7  Chloride 98 - 111 mmol/L 105 107 103  CO2 22 - 32 mmol/L Calcium 8.9 - 10.3 mg/dL 4.5(W) 0.9(W) 8.9  Total Protein 6.5 - 8.1 g/dL 5.9(L) 7.1 -  Total Bilirubin 0.3 - 1.2 mg/dL 0.8 0.6 -  Alkaline Phos 38 - 126 U/L 64 74 -  AST 15 - 41 U/L 28 33 -  ALT 0 - 44 U/L 30 35 -       Micro Results Recent Results (from the past 240 hour(s))  Resp Panel by RT-PCR (Flu A&B, Covid) Nasopharyngeal Swab     Status: None   Collection Time: 12/31/20  3:50 AM   Specimen: Nasopharyngeal Swab; Nasopharyngeal(NP) swabs in vial transport medium  Result Value Ref Range Status   SARS Coronavirus 2 by RT PCR NEGATIVE NEGATIVE Final    Comment: (NOTE) SARS-CoV-2 target nucleic acids are NOT DETECTED.  The SARS-CoV-2 RNA is generally detectable in upper respiratory specimens during the acute phase of infection. The lowest concentration of SARS-CoV-2 viral copies this assay can detect is 138 copies/mL. A negative result does not preclude SARS-Cov-2 infection and should not be used as the sole  basis for treatment or other patient management decisions. A negative result may occur with  improper specimen collection/handling, submission of specimen other than nasopharyngeal swab, presence of viral mutation(s) within the areas targeted by this assay, and inadequate number of viral copies(<138 copies/mL). A negative result must be combined with clinical observations, patient history, and epidemiological information. The expected result is Negative.  Fact Sheet for Patients:  BloggerCourse.com  Fact Sheet for Healthcare Providers:  SeriousBroker.it  This test is no t yet approved or cleared by the [place] FDA and  has been authorized for detection and/or diagnosis of SARS-CoV-2 by FDA under an Emergency Use Authorization (EUA). This EUA will remain  in effect (meaning this test can be used) for the duration of the COVID-19 declaration under Section 564(b)(1) of the Act, 21 U.S.C.section 360bbb-3(b)(1), unless the authorization is terminated  or revoked sooner.       Influenza A by PCR NEGATIVE NEGATIVE Final   Influenza B by PCR NEGATIVE NEGATIVE Final    Comment: (NOTE) The Xpert Xpress SARS-CoV-2/FLU/RSV plus assay is intended as an aid in the diagnosis of influenza from Nasopharyngeal swab specimens and should not be used as a sole basis for treatment. Nasal washings and aspirates are unacceptable for Xpert Xpress SARS-CoV-2/FLU/RSV testing.  Fact Sheet for Patients: BloggerCourse.com  Fact Sheet for Healthcare Providers: SeriousBroker.it  This test is not yet approved or cleared by the [place] FDA and has been authorized for detection and/or diagnosis of SARS-CoV-2 by FDA under an Emergency Use Authorization (EUA). This EUA will remain in effect (meaning this test can be used) for the duration of the COVID-19 declaration under Section 564(b)(1) of the Act,  21 U.S.C. section 360bbb-3(b)(1), unless the authorization is terminated or revoked.  Performed at [facility], [address] [place], [place] [address]     Radiology Reports DG Chest [place] 1 View  Result Date: 12/31/2020 CLINICAL DATA:  Dyspnea EXAM: PORTABLE CHEST 1 VIEW COMPARISON:  11/23/2020, 04/06/2020 FINDINGS: The lungs are symmetrically well expanded. Chronic underlying parenchymal interstitial changes are again identified, however, there are superimposed interstitial infiltrates now present likely representing mild superimposed interstitial pulmonary edema. The cardiac size is mildly enlarged, similar to prior  examination, but progressive since 04/06/2020. No pneumothorax or pleural effusion. No acute bone abnormality. IMPRESSION: Mild cardiogenic failure superimposed upon underlying mild chronic interstitial lung disease. Electronically Signed   By: Helyn Numbers MD   On: 12/31/2020 04:05   ECHOCARDIOGRAM COMPLETE  Result Date: 12/28/2020    ECHOCARDIOGRAM REPORT   Patient Name:   Paytan Howry Date of Exam: 12/27/2020 Medical Rec #:  [id number]   Height:       64.0 in Accession #:    [id number]  Weight:       132.2 lb Date of Birth:  Sep 05, 1945   BSA:          1.641 m Patient Age:    74 years    BP:           160/110 mmHg Patient Gender: F           HR:           65 bpm. [facility] Procedure: 2D Echo, Cardiac Doppler and Color Doppler Indications:    R06.02 (ICD-10-CM) - SOB (shortness of breath)  History:        Patient has prior history of Echocardiogram examinations, most                 recent 04/20/2015. CHF and Cardiomyopathy, COPD,                 Arrythmias:Atrial Fibrillation; Risk Factors:Former Smoker and                 Hypertension. ETOH.  Sonographer:    Celesta Gentile RCS Referring Phys: [id number] Dorothe Pea BRANCH IMPRESSIONS  1. The apical viiews are somehat foreshortend There is diffuse hypokinesis, worse in the basal inferior and inferoseptal walls . Left  ventricular ejection fraction, by estimation, is 40%%. The left ventricle demonstrates global hypokinesis. The left ventricular internal cavity size was severely dilated. There is mild left ventricular hypertrophy. Left ventricular diastolic parameters are indeterminate.  2. Right ventricular systolic function is normal. The right ventricular size is normal. There is moderately elevated pulmonary artery systolic pressure.  3. Left atrial size was severely dilated.  4. The mitral valve is normal in structure. Moderate mitral valve regurgitation.  5. Tricuspid valve regurgitation is mild to moderate.  6. The aortic valve is tricuspid. Aortic valve regurgitation is not visualized. Mild aortic valve sclerosis is present, with no evidence of aortic valve stenosis.  7. The inferior vena cava is dilated in size with >50% respiratory variability, suggesting right atrial pressure of 8 mmHg. Comparison(s): No prior Echocardiogram. FINDINGS  Left Ventricle: The apical viiews are somehat foreshortend There is diffuse hypokinesis, worse in the basal inferior and inferoseptal walls. Left ventricular ejection fraction, by estimation, is 40%%. The left ventricle demonstrates global hypokinesis. The left ventricular internal cavity size was severely dilated. There is mild left ventricular hypertrophy. Left ventricular diastolic parameters are indeterminate. Right Ventricle: The right ventricular size is normal. Right vetricular wall thickness was not assessed. Right ventricular systolic function is normal. There is moderately elevated pulmonary artery systolic pressure. The tricuspid regurgitant velocity is  3.37 m/s, and with an assumed right atrial pressure of 3 mmHg, the estimated right ventricular systolic pressure is 48.4 mmHg. Left Atrium: Left atrial size was severely dilated. Right Atrium: Right atrial size was normal in size. Pericardium: Trivial pericardial effusion is present. Mitral Valve: The mitral valve is normal in  structure. Moderate mitral valve regurgitation. Tricuspid Valve: The tricuspid valve is normal  in structure. Tricuspid valve regurgitation is mild to moderate. Aortic Valve: The aortic valve is tricuspid. Aortic valve regurgitation is not visualized. Mild aortic valve sclerosis is present, with no evidence of aortic valve stenosis. Pulmonic Valve: The pulmonic valve was normal in structure. Pulmonic valve regurgitation is not visualized. Aorta: The aortic root is normal in size and structure. Venous: The inferior vena cava is dilated in size with greater than 50% respiratory variability, suggesting right atrial pressure of 8 mmHg. IAS/Shunts: No atrial level shunt detected by color flow Doppler.  LEFT VENTRICLE PLAX 2D LVIDd:         6.30 cm LVIDs:         4.90 cm LV PW:         1.30 cm LV IVS:        1.00 cm LVOT diam:     1.90 cm LV SV:         42 LV SV Index:   26 LVOT Area:     2.84 cm  RIGHT VENTRICLE TAPSE (M-mode): 1.5 cm LEFT ATRIUM              Index        RIGHT ATRIUM           Index LA diam:        5.90 cm  3.60 cm/m   RA Area:     15.80 cm LA Vol (A2C):   182.0 ml 110.93 ml/m RA Volume:   36.10 ml  22.00 ml/m LA Vol (A4C):   213.0 ml 129.82 ml/m LA Biplane Vol: 197.0 ml 120.07 ml/m  AORTIC VALVE LVOT Vmax:   94.60 cm/s LVOT Vmean:  58.100 cm/s LVOT VTI:    0.149 m  AORTA Ao Root diam: 3.50 cm MITRAL VALVE                TRICUSPID VALVE MV Area (PHT): 6.65 cm     TR Peak grad:   45.4 mmHg MV Decel Time: 114 msec     TR Vmax:        337.00 cm/s MR Peak grad: 89.9 mmHg MR Mean grad: 47.0 mmHg     SHUNTS MR Vmax:      474.00 cm/s   Systemic VTI:  0.15 m MR Vmean:     317.0 cm/s    Systemic Diam: 1.90 cm MV E velocity: 127.00 cm/s Dietrich Pates MD Electronically signed by Dietrich Pates MD Signature Date/Time: 12/28/2020/1:49:19 AM    Final     SIGNED: Kendell Bane, MD, FHM. [facility],  Pager (please use [email or web address] to page/text) Please use Epic Secure Chat for non-urgent communication  (7AM-7PM)  If 7PM-7AM, please contact night-coverage [email or web address], 01/01/2021, 12:04 PM

## 2021-01-01 NOTE — Progress Notes (Signed)
Patient not wearing oxygen sitting in chair, Saturation un obtainable. Patient is wheezing on left. She does become choked when swallowing water at times. Appears pleasant now with no  shortness of breath.

## 2021-01-01 NOTE — Progress Notes (Addendum)
Doctor notified of BP 179/103 MAP 130. Pt sleeping peacefully through the night. One episode of getting up. No new orders at this time.

## 2021-01-02 DIAGNOSIS — E876 Hypokalemia: Secondary | ICD-10-CM

## 2021-01-02 DIAGNOSIS — I272 Pulmonary hypertension, unspecified: Secondary | ICD-10-CM

## 2021-01-02 LAB — BASIC METABOLIC PANEL
Anion gap: 8 (ref 5–15)
BUN: 28 mg/dL — ABNORMAL HIGH (ref 8–23)
CO2: 30 mmol/L (ref 22–32)
Calcium: 8.6 mg/dL — ABNORMAL LOW (ref 8.9–10.3)
Chloride: 102 mmol/L (ref 98–111)
Creatinine, Ser: 1.23 mg/dL — ABNORMAL HIGH (ref 0.44–1.00)
GFR, Estimated: 46 mL/min — ABNORMAL LOW (ref >60)
Glucose, Bld: 109 mg/dL — ABNORMAL HIGH (ref 70–99)
Potassium: 3.3 mmol/L — ABNORMAL LOW (ref 3.5–5.1)
Sodium: 140 mmol/L (ref 135–145)

## 2021-01-02 MED ORDER — QUETIAPINE FUMARATE 25 MG PO TABS
50.0000 mg | ORAL_TABLET | Freq: Every day | ORAL | Status: DC
  Administered 2021-01-02 – 2021-01-04 (×2): 50 mg via ORAL
  Filled 2021-01-02 (×3): qty 2

## 2021-01-02 MED ORDER — FUROSEMIDE 40 MG PO TABS
40.0000 mg | ORAL_TABLET | Freq: Every day | ORAL | Status: DC
  Administered 2021-01-02 – 2021-01-05 (×4): 40 mg via ORAL
  Filled 2021-01-02 (×4): qty 1

## 2021-01-02 MED ORDER — POTASSIUM CHLORIDE CRYS ER 10 MEQ PO TBCR
10.0000 meq | EXTENDED_RELEASE_TABLET | Freq: Every day | ORAL | Status: DC
  Filled 2021-01-02: qty 1

## 2021-01-02 MED ORDER — POTASSIUM CHLORIDE CRYS ER 10 MEQ PO TBCR
10.0000 meq | EXTENDED_RELEASE_TABLET | Freq: Every day | ORAL | Status: DC
  Administered 2021-01-03 – 2021-01-05 (×3): 10 meq via ORAL
  Filled 2021-01-02 (×3): qty 1

## 2021-01-02 MED ORDER — ALPRAZOLAM 0.25 MG PO TABS
0.2500 mg | ORAL_TABLET | Freq: Three times a day (TID) | ORAL | Status: DC | PRN
  Administered 2021-01-02 (×2): 0.25 mg via ORAL
  Filled 2021-01-02 (×2): qty 1

## 2021-01-02 MED ORDER — POTASSIUM CHLORIDE CRYS ER 20 MEQ PO TBCR
40.0000 meq | EXTENDED_RELEASE_TABLET | Freq: Once | ORAL | Status: AC
  Administered 2021-01-02: 40 meq via ORAL

## 2021-01-02 NOTE — TOC Initial Note (Addendum)
Transition of Care [facility]) - Initial/Assessment Note    Patient Details  Name: Rebecca Huerta MRN: [id number] Date of Birth: [birth year]  Transition of Care [facility] CM/SW Contact:    Barry Brunner, LCSW Phone Number: 01/02/2021, 2:14 PM  Clinical Narrative:                 Patient is a 75 year old female admitted for Acute respiratory failure with hypoxia (HCC). CSW observed paitent's high readmission risk score. CSW conducted initial assessment and readmission risk assessment. Patien'ts daughter reported that patient required assistance with all ADL's Patient's daughter agreeable to SNF. Patient is not vaccinated for Covid 19. Patient's daughter agreeable to referral to [facility] and agreeable to discussed additional SNF. CSW LVM informing patient's daughter of medicare.gov reviews and to discuss other SNF referrals. Patient faxed out. CSW started auth for ambulance and SNF. TOC to follow.   Expected Discharge Plan: Skilled Nursing Facility Barriers to Discharge: Continued Medical Work up   Patient Goals and CMS Choice Patient states their goals for this hospitalization and ongoing recovery are:: Rehab with SNF CMS Medicare.gov Compare Post Acute Care list provided to:: Patient Choice offered to / list presented to : Patient  Expected Discharge Plan and Services Expected Discharge Plan: Skilled Nursing Facility In-house Referral: Clinical Social Work Discharge Planning Services: CM Consult Post Acute Care Choice: Home Health Living arrangements for the past 2 months: Single Family Home                                      Prior Living Arrangements/Services Living arrangements for the past 2 months: Single Family Home Lives with:: Self, Adult Children Patient language and need for interpreter reviewed:: Yes Do you feel safe going back to the place where you live?: Yes      Need for Family Participation in Patient Care: Yes (Comment) Care giver support system in place?: Yes  (comment) Current home services: DME Criminal Activity/Legal Involvement Pertinent to Current Situation/Hospitalization: No - Comment as needed  Activities of Daily Living Home Assistive Devices/Equipment: None ADL Screening (condition at time of admission) Patient's cognitive ability adequate to safely complete daily activities?: No Is the patient deaf or have difficulty hearing?: No Does the patient have difficulty seeing, even when wearing glasses/contacts?: No Does the patient have difficulty concentrating, remembering, or making decisions?: Yes Patient able to express need for assistance with ADLs?: Yes Does the patient have difficulty dressing or bathing?: No Independently performs ADLs?: Yes (appropriate for developmental age) Does the patient have difficulty walking or climbing stairs?: Yes Weakness of Legs: Both Weakness of Arms/Hands: None  Permission Sought/Granted   Permission granted to share information with : Yes, Verbal Permission Granted  Share Information with NAME: Hamon-Law,[name]  Permission granted to share info w AGENCY: SNF  Permission granted to share info w Relationship: (Daughter)  Permission granted to share info w Contact Information: [phone]  Emotional Assessment Appearance:: Appears stated age Attitude/Demeanor/Rapport: Unable to Assess Affect (typically observed): Adaptable, Appropriate Orientation: : Oriented to Self, Oriented to Situation, Oriented to Place, Oriented to  Time Alcohol / Substance Use: Not Applicable Psych Involvement: No (comment)  Admission diagnosis:  CHF exacerbation (HCC) [I50.9] Acute CHF (congestive heart failure) (HCC) [I50.9] Acute on chronic congestive heart failure, unspecified heart failure type [facility]) [I50.9] Patient Active Problem List   Diagnosis Date Noted   CHF exacerbation (HCC) 12/31/2020   Acute CHF (congestive  heart failure) (HCC) 12/31/2020   Acute respiratory failure with hypoxia (HCC) 12/31/2020    Scalp laceration 04/07/2020   Leukocytosis 04/07/2020   History of CHF (congestive heart failure) 04/07/2020   Lumbar burst fracture (HCC) 04/06/2020   Cardiomyopathy (HCC) 06/09/2014   Chronic systolic heart failure (HCC) 06/09/2014   Malnutrition (HCC) 06/09/2014   Rash 06/09/2014   Anticoagulation adequate 06/09/2014   Pulmonary hypertension (HCC)    Acute on chronic systolic CHF (congestive heart failure) (HCC) 05/12/2014   Benign essential HTN 05/12/2014   Anxiety state 05/12/2014   Atrial fibrillation with RVR (HCC) 05/12/2014   Postoperative pulmonary edema (HCC) 05/12/2014   Pleural effusion 05/12/2014   Hypokalemia 05/12/2014   Hyponatremia 05/12/2014   A-fib (HCC) 05/11/2014   Edema leg 05/11/2014   Pulmonary edema 05/11/2014   Tobacco abuse 05/11/2014   Alcohol abuse 05/11/2014   PCP:  Delorse Lek, MD Pharmacy:   [facility] [phone] - [place], [place] - [address] AT [facility] [address]  [place] [address] Phone: [phone] Fax: [phone]     Social Determinants of Health (SDOH) Interventions    Readmission Risk Interventions Readmission Risk Prevention Plan 01/02/2021  Transportation Screening Complete  PCP or Specialist Appt within 5-7 Days Complete  Home Care Screening Complete  Medication Review (RN CM) Complete  Some recent data might be hidden

## 2021-01-02 NOTE — Evaluation (Signed)
Physical Therapy Evaluation Patient Details Name: Rebecca Huerta MRN: [id number] DOB: [birth year] Today's Date: 01/02/2021   History of Present Illness  Rebecca Huerta  is a 75 y.o. female, with history of A. fib, anxiety, dementia, CHF, tobacco use disorder, and presents the ED with a chief complaint of dyspnea.  Patient has known history of CHF and recently had an echo 4 days ago.  She has not yet followed up with her cardiologist since the echo.  Daughter reports the patient has had increasing her peripheral edema over the last 5 or 6 days.  At home she is on 20 mg of Lasix 3 times weekly-MWF.  She has been compliant with this medication.  Last 2 days she has had an increase in her shortness of breath as well.  This evening the family noticed increased work of breathing, patient nodding off and then waking up gasping for air.  Decided was time to bring her into the hospital.  The ED provider reports that patient was very dyspneic speaking in only 3 or 4 word phrases upon arrival.  She had crackles on exam she was started on nitro, BiPAP, given Lasix and had symptom improvement.  Daughter reports the patient has been complaining of chest pain intermittently for about 24 hours.  Unfortunately patient cannot give details due to her dementia.  The daughter is unsure if the chest pain is exertional, but she knows that the dyspnea is worse with exertion.  Patient is able to ambulate without any assistive device at home.  She has had a decrease in her appetite that is chronic.  Last night for dinner she did have a high salt meal with hamburger and [place] fries.  Is a current smoker and and has a chronic cough.  She did choke on her dinner 2 days ago, and her cough seems to have increased since then.   Clinical Impression  Patient demonstrates slightly labored movement for sitting up at bedside, very unsteady on feet with near fall when attempting sit to stands and ambulation without AD, required use of RW for safety,  but has tendency to push RW to far in front once fatigue requiring verbal/tactile cueing to avoid falling.  Patient tolerated sitting up in chair after therapy with her daughter present in room  - RN notified.  Patient will benefit from continued physical therapy in hospital and recommended venue below to increase strength, balance, endurance for safe ADLs and gait.      Follow Up Recommendations SNF    Equipment Recommendations  None recommended by PT    Recommendations for Other Services       Precautions / Restrictions Precautions Precautions: Fall Restrictions Weight Bearing Restrictions: No      Mobility  Bed Mobility Overal bed mobility: Needs Assistance Bed Mobility: Supine to Sit     Supine to sit: Supervision     General bed mobility comments: slightly increased time, labored movement    Transfers Overall transfer level: Needs assistance Equipment used: Rolling walker (2 wheeled);None Transfers: Sit to/from [employer] to Stand: Min assist Stand pivot transfers: Min assist       General transfer comment: very unsteady without AD with near fall, required use of RW for safety  Ambulation/Gait Ambulation/Gait assistance: Min assist;Mod assist Gait Distance (Feet): 25 Feet Assistive device: Rolling walker (2 wheeled) Gait Pattern/deviations: Decreased step length - right;Decreased step length - left;Decreased stride length;Trunk flexed Gait velocity: decreased   General Gait Details: slow labored cadence with frequent  leaning over RW with tendency to push RW to far in front once fatigued reuquiring verbal/tactile cueing to avoid falling  Stairs            Wheelchair Mobility    Modified Rankin (Stroke Patients Only)       Balance Overall balance assessment: Needs assistance Sitting-balance support: Feet supported;No upper extremity supported Sitting balance-Leahy Scale: Good Sitting balance - Comments: seated at EOB    Standing balance support: During functional activity;No upper extremity supported Standing balance-Leahy Scale: Poor Standing balance comment: fair/poor using RW                             Pertinent Vitals/Pain Pain Assessment: No/denies pain    Home Living Family/patient expects to be discharged to:: Private residence Living Arrangements: Alone Available Help at Discharge: Friend(s);Neighbor;Available PRN/intermittently;Family Type of Home: House Home Access: Stairs to enter Entrance Stairs-Rails: None Entrance Stairs-Number of Steps: 3 Home Layout: Two level Home Equipment: Shower seat - built in;Walker - 2 wheels      Prior Function Level of Independence: Needs assistance   Gait / Transfers Assistance Needed: household ambulator without AD  ADL's / Homemaking Assistance Needed: assisted by family/friends        Hand Dominance   Dominant Hand: Right    Extremity/Trunk Assessment   Upper Extremity Assessment Upper Extremity Assessment: Generalized weakness    Lower Extremity Assessment Lower Extremity Assessment: Generalized weakness    Cervical / Trunk Assessment Cervical / Trunk Assessment: Kyphotic  Communication   Communication: No difficulties  Cognition Arousal/Alertness: Awake/alert Behavior During Therapy: WFL for tasks assessed/performed Overall Cognitive Status: History of cognitive impairments - at baseline                                        General Comments      Exercises     Assessment/Plan    PT Assessment Patient needs continued PT services  PT Problem List Decreased strength;Decreased activity tolerance;Decreased balance;Decreased mobility       PT Treatment Interventions DME instruction;Stair training;Gait training;Functional mobility training;Therapeutic activities;Therapeutic exercise;Balance training;Patient/family education    PT Goals (Current goals can be found in the Care Plan section)   Acute Rehab PT Goals Patient Stated Goal: return home after rehab PT Goal Formulation: With patient/family Time For Goal Achievement: 01/10/21 Potential to Achieve Goals: Good    Frequency Min 3X/week   Barriers to discharge        Co-evaluation               AM-PAC PT "6 Clicks" Mobility  Outcome Measure Help needed turning from your back to your side while in a flat bed without using bedrails?: None Help needed moving from lying on your back to sitting on the side of a flat bed without using bedrails?: A Little Help needed moving to and from a bed to a chair (including a wheelchair)?: A Little Help needed standing up from a chair using your arms (e.g., wheelchair or bedside chair)?: A Lot Help needed to walk in hospital room?: A Lot Help needed climbing 3-5 steps with a railing? : A Lot 6 Click Score: 16    End of Session   Activity Tolerance: Patient tolerated treatment well;Patient limited by fatigue Patient left: in chair;with call bell/phone within reach;with chair alarm set;with family/visitor present Nurse Communication: Mobility status  PT Visit Diagnosis: Unsteadiness on feet (R26.81);Other abnormalities of gait and mobility (R26.89);Muscle weakness (generalized) (M62.81)    Time: 7353-2992 PT Time Calculation (min) (ACUTE ONLY): 21 min   Charges:   PT Evaluation $PT Eval Moderate Complexity: 1 Mod PT Treatments $Therapeutic Activity: 8-22 mins        12:01 PM, 01/02/21 Ocie Bob, MPT Physical Therapist with [facility] 336 [phone] office [phone] mobile phone

## 2021-01-02 NOTE — Plan of Care (Signed)
  Problem: Acute Rehab PT Goals(only PT should resolve) Goal: Pt Will Go Supine/Side To Sit Outcome: Progressing Flowsheets (Taken 01/02/2021 1202) Pt will go Supine/Side to Sit: with modified independence Goal: Patient Will Transfer Sit To/From Stand Outcome: Progressing Flowsheets (Taken 01/02/2021 1202) Patient will transfer sit to/from stand:  with min guard assist  with minimal assist Goal: Pt Will Transfer Bed To Chair/Chair To Bed Outcome: Progressing Flowsheets (Taken 01/02/2021 1202) Pt will Transfer Bed to Chair/Chair to Bed:  with min assist  with mod assist Goal: Pt Will Ambulate 01/02/2021 1203 by Ocie Bob, PT Outcome: Progressing Flowsheets (Taken 01/02/2021 1203) Pt will Ambulate:  50 feet  with min guard assist  with minimal assist  with rolling walker 01/02/2021 1202 by Ocie Bob, PT Outcome: Progressing Flowsheets (Taken 01/02/2021 1202) Pt will Ambulate:  with min guard assist  with minimal assist  with rolling walker   12:03 PM, 01/02/21 Ocie Bob, MPT Physical Therapist with [facility] 336 [phone] office [phone] mobile phone

## 2021-01-02 NOTE — Progress Notes (Signed)
Patient has become more confused and more non directable as night shift has progressed.  Patient is only alert and oriented x 1 and patient has tried several times to get out of bed without assistance.

## 2021-01-02 NOTE — Progress Notes (Signed)
PROGRESS NOTE    Patient: Rebecca Huerta                            PCP: Delorse Lek, MD                    DOB: 1945/07/18            DOA: 12/31/2020 JOA:[id number]             DOS: 01/02/2021, 9:47 AM   LOS: 2 days   Date of Service: The patient was seen and examined on 01/02/2021  Subjective:   The patient was seen and examined this morning, stable awake sitting up in bed, pleasantly confused. Nursing staff report confusion overnight but no agitation  Brief Narrative:   Rebecca Huerta  is a 75 y.o. female, with history of A. fib, anxiety, dementia, CHF, tobacco use disorder, and presents the ED with a chief complaint of dyspnea.   Patient has known history of CHF and recently had an echo 4 days ago.   Daughter reports the patient has had increasing her peripheral edema over the last 5 or 6 days.  At home she is on 20 mg of Lasix 3 times weekly-MWF.  She has been compliant with this medication.  Last 2 days she has had an increase in her shortness of breath as well.  This evening the family noticed increased work of breathing, patient nodding off and then waking up gasping for air.     Assessment & Plan:   Active Problems:   A-fib (HCC)   Tobacco abuse   Acute on chronic systolic CHF (congestive heart failure) (HCC)   Benign essential HTN   Hypokalemia   Pulmonary hypertension (HCC)   CHF exacerbation (HCC)   Acute CHF (congestive heart failure) (HCC)   Acute respiratory failure with hypoxia (HCC)    Acute on chronic combined systolic-diastolic CHF. -Last echo reviewed, EF 40% -Status post treatment with IV Lasix, will switch to p.o. today 01/02/2021 -Continue on Coreg - Entresto started 01/01/2021-tolerating well -Appreciate cardiology --- recommended continue diuretics, initiating Entresto 24/26 mg twice daily (Discontinued lisinopril)   Acute respiratory failure with hypoxia -Much improved, patient has been successfully weaned off supplemental oxygen, currently on room  air, satting 98% -Due to CHF/COPD -Patient required BiPAP on admission -She has not been weaned down to nasal cannula -Continue to monitor -per Daughter not O2 dependent at home, Was on O2 up to 4 L, was subsequently weaned off  COPD -She has long history of tobacco use--refusing to quit -She has not been formally diagnosed with COPD, she has not had a PFTs -Continue inhaled steroids, bronchodilators -If she develops significant wheezing, will likely start IV steroids--we will taper down slowly -was  requiring 3 L of oxygen, currently satting 100% (goal to maintain 88-92%) Weaned off supplemental oxygen, currently on room air, satting 98%   Permanent atrial fibrillation -Currently rate controlled, remained stable -Anticoagulated with Xarelto   Hypertension -Much improved, stable -Currently on Coreg -Discontinued lisinopril,  -Entresto 24/26 mg p.o. twice daily 8 01/01/2021   Dementia -She is having periods worsening confusion, with mild agitation -Ativan discontinued, continue Xanax 0.25 mg 3 times daily as needed -Seroquel 50 mg p.o. nightly initiated 01/02/2021  Goals of care -Discussed with patient's daughter who is her POA at bedside -She admits that patient would want to be DNR  Severe generalized global weakness -Pending PT/OT evaluation, -Based on  current evaluation patient would likely qualify for SNF - daughter is agreeable with the plan -Social worker consulted to assist for placement  ---------------------------------------------------------------------------------------------------------------------------------------------- Cultures; None   Antimicrobials: None    Consultants: Cardiology   ------------------------------------------------------------------------------------------------------------------------------------------------  DVT prophylaxis:  [id number]  Code Status:   Code Status: DNR  Family Communication: No family member present at  bedside- attempt will be made to update daily The above findings and plan of care has been discussed with patient (and family)  in detail,  they expressed understanding and agreement of above. -Advance care planning has been discussed.   Admission status:   Status is: Inpatient  Remains inpatient appropriate because:Hemodynamically unstable and Inpatient level of care appropriate due to severity of illness  Dispo: The patient is from: Home              Anticipated d/c is to: Home to her daughter in next 2-3 days              Patient currently is not medically stable to d/c.   Difficult to place patient No      Level of care: Telemetry   Procedures:   No admission procedures for hospital encounter.    Antimicrobials:  Anti-infectives (From admission, onward)    None        Medication:   budesonide (PULMICORT) nebulizer solution  0.25 mg Nebulization BID   carvedilol  25 mg Oral BID WC   furosemide  40 mg Oral Daily   guaiFENesin  1,200 mg Oral BID   ipratropium-albuterol  3 mL Nebulization Q6H WA   nicotine  21 mg Transdermal Daily   [START ON 01/03/2021] potassium chloride  10 mEq Oral Daily   potassium chloride  40 mEq Oral Once   QUEtiapine  50 mg Oral QHS   Rivaroxaban  15 mg Oral Q supper   sacubitril-valsartan  1 tablet Oral BID   thiamine  100 mg Oral Daily    acetaminophen **OR** acetaminophen, ALPRAZolam, morphine injection, ondansetron **OR** ondansetron (ZOFRAN) IV, oxyCODONE, polyethylene glycol   Objective:   Vitals:   01/02/21 0336 01/02/21 0500 01/02/21 0740 01/02/21 0745  BP: (!) 121/99     Pulse: 100     Resp: 19     Temp: 98.2 F (36.8 C)     TempSrc:      SpO2: 98%  96% 96%  Weight:  57.9 kg    Height:        Intake/Output Summary (Last 24 hours) at 01/02/2021 0947 Last data filed at 01/01/2021 1749 Gross per 24 hour  Intake 350 ml  Output 1500 ml  Net -1150 ml   Filed Weights   12/31/20 2200 01/01/21 0513 01/02/21 0500   Weight: 59.9 kg 59.3 kg 57.9 kg     Examination:       Physical Exam:   General:  Awake, pleasantly confused  HEENT:  Normocephalic, PERRL, otherwise with in Normal limits   Neuro:  CNII-XII intact. , normal motor and sensation, reflexes intact   Lungs:   Clear to auscultation BL, Respirations unlabored, no wheezes / crackles  Cardio:    S1/S2, RRR, No murmure, No Rubs or Gallops   Abdomen:   Soft, non-tender, bowel sounds active all four quadrants,  no guarding or peritoneal signs.  Muscular skeletal:  Global generalized weaknesses, Limited exam - in bed, able to move all 4 extremities,  2+ pulses,  symmetric, No pitting edema  Skin:  Dry, warm to touch, negative for any Rashes,  Wounds: Please see nursing documentation          ------------------------------------------------------------------------------------------------------------------------------------------    LABs:  CBC Latest Ref Rng & Units 01/01/2021 12/31/2020 11/23/2020  WBC 4.0 - 10.5 K/uL 7.7 8.9 7.9  Hemoglobin 12.0 - 15.0 g/dL 10.7(L) 11.8(L) 12.6  Hematocrit 36.0 - 46.0 % 36.3 39.1 41.0  Platelets 150 - 400 K/uL 193 246 220   CMP Latest Ref Rng & Units 01/02/2021 01/01/2021 12/31/2020  Glucose 70 - 99 mg/dL 588(F) 92 027(X)  BUN 8 - 23 mg/dL 41(O) 20 19  Creatinine 0.44 - 1.00 mg/dL 8.78(M) 7.67(M) 0.94(B)  Sodium 135 - 145 mmol/L 140 142 140  Potassium 3.5 - 5.1 mmol/L 3.3(L) 3.9 3.4(L)  Chloride 98 - 111 mmol/L 102 105 107  CO2 22 - 32 mmol/L 30 29 25   Calcium 8.9 - 10.3 mg/dL ) 0.9(G) 2.8(Z)  Total Protein 6.5 - 8.1 g/dL - 5.9(L) 7.1  Total Bilirubin 0.3 - 1.2 mg/dL - 0.8 0.6  Alkaline Phos 38 - 126 U/L - 64 74  AST 15 - 41 U/L - 28 33  ALT 0 - 44 U/L - 30 35       Micro Results Recent Results (from the past 240 hour(s))  Resp Panel by RT-PCR (Flu A&B, Covid) Nasopharyngeal Swab     Status: None   Collection Time: 12/31/20  3:50 AM   Specimen: Nasopharyngeal Swab; Nasopharyngeal(NP)  swabs in vial transport medium  Result Value Ref Range Status   SARS Coronavirus 2 by RT PCR NEGATIVE NEGATIVE Final    Comment: (NOTE) SARS-CoV-2 target nucleic acids are NOT DETECTED.  The SARS-CoV-2 RNA is generally detectable in upper respiratory specimens during the acute phase of infection. The lowest concentration of SARS-CoV-2 viral copies this assay can detect is 138 copies/mL. A negative result does not preclude SARS-Cov-2 infection and should not be used as the sole basis for treatment or other patient management decisions. A negative result may occur with  improper specimen collection/handling, submission of specimen other than nasopharyngeal swab, presence of viral mutation(s) within the areas targeted by this assay, and inadequate number of viral copies(<138 copies/mL). A negative result must be combined with clinical observations, patient history, and epidemiological information. The expected result is Negative.  Fact Sheet for Patients:  01/02/21  Fact Sheet for Healthcare Providers:  BloggerCourse.com  This test is no t yet approved or cleared by the [employer] FDA and  has been authorized for detection and/or diagnosis of SARS-CoV-2 by FDA under an Emergency Use Authorization (EUA). This EUA will remain  in effect (meaning this test can be used) for the duration of the COVID-19 declaration under Section 564(b)(1) of the Act, 21 U.S.C.section 360bbb-3(b)(1), unless the authorization is terminated  or revoked sooner.       Influenza A by PCR NEGATIVE NEGATIVE Final   Influenza B by PCR NEGATIVE NEGATIVE Final    Comment: (NOTE) The Xpert Xpress SARS-CoV-2/FLU/RSV plus assay is intended as an aid in the diagnosis of influenza from Nasopharyngeal swab specimens and should not be used as a sole basis for treatment. Nasal washings and aspirates are unacceptable for Xpert Xpress  SARS-CoV-2/FLU/RSV testing.  Fact Sheet for Patients: [place]  Fact Sheet for Healthcare Providers: BloggerCourse.com  This test is not yet approved or cleared by the [employer]ious[employer].[place] FDA and has been authorized for detection and/or diagnosis of SARS-CoV-2 by FDA under an Emergency Use Authorization (EUA). This EUA will remain in effect (meaning this test can be used) for  the duration of the COVID-19 declaration under Section 564(b)(1) of the Act, 21 U.S.C. section 360bbb-3(b)(1), unless the authorization is terminated or revoked.  Performed at [facility], [address] [place], [place] [address]     Radiology Reports DG Chest [place] 1 View  Result Date: 12/31/2020 CLINICAL DATA:  Dyspnea EXAM: PORTABLE CHEST 1 VIEW COMPARISON:  11/23/2020, 04/06/2020 FINDINGS: The lungs are symmetrically well expanded. Chronic underlying parenchymal interstitial changes are again identified, however, there are superimposed interstitial infiltrates now present likely representing mild superimposed interstitial pulmonary edema. The cardiac size is mildly enlarged, similar to prior examination, but progressive since 04/06/2020. No pneumothorax or pleural effusion. No acute bone abnormality. IMPRESSION: Mild cardiogenic failure superimposed upon underlying mild chronic interstitial lung disease. Electronically Signed   By: Helyn Numbers MD   On: 12/31/2020 04:05   ECHOCARDIOGRAM COMPLETE  Result Date: 12/28/2020    ECHOCARDIOGRAM REPORT   Patient Name:   Clessie Heeney Date of Exam: 12/27/2020 Medical Rec #:  [id number]   Height:       64.0 in Accession #:    [id number]  Weight:       132.2 lb Date of Birth:  February 14, 1946   BSA:          1.641 m Patient Age:    74 years    BP:           160/110 mmHg Patient Gender: F           HR:           65 bpm. [facility] Procedure: 2D Echo, Cardiac Doppler and Color Doppler Indications:    R06.02  (ICD-10-CM) - SOB (shortness of breath)  History:        Patient has prior history of Echocardiogram examinations, most                 recent 04/20/2015. CHF and Cardiomyopathy, COPD,                 Arrythmias:Atrial Fibrillation; Risk Factors:Former Smoker and                 Hypertension. ETOH.  Sonographer:    Celesta Gentile RCS Referring Phys: [id number] Dorothe Pea BRANCH IMPRESSIONS  1. The apical viiews are somehat foreshortend There is diffuse hypokinesis, worse in the basal inferior and inferoseptal walls . Left ventricular ejection fraction, by estimation, is 40%%. The left ventricle demonstrates global hypokinesis. The left ventricular internal cavity size was severely dilated. There is mild left ventricular hypertrophy. Left ventricular diastolic parameters are indeterminate.  2. Right ventricular systolic function is normal. The right ventricular size is normal. There is moderately elevated pulmonary artery systolic pressure.  3. Left atrial size was severely dilated.  4. The mitral valve is normal in structure. Moderate mitral valve regurgitation.  5. Tricuspid valve regurgitation is mild to moderate.  6. The aortic valve is tricuspid. Aortic valve regurgitation is not visualized. Mild aortic valve sclerosis is present, with no evidence of aortic valve stenosis.  7. The inferior vena cava is dilated in size with >50% respiratory variability, suggesting right atrial pressure of 8 mmHg. Comparison(s): No prior Echocardiogram. FINDINGS  Left Ventricle: The apical viiews are somehat foreshortend There is diffuse hypokinesis, worse in the basal inferior and inferoseptal walls. Left ventricular ejection fraction, by estimation, is 40%%. The left ventricle demonstrates global hypokinesis. The left ventricular internal cavity size was severely dilated. There is mild left ventricular hypertrophy. Left ventricular diastolic parameters  are indeterminate. Right Ventricle: The right ventricular size is normal. Right  vetricular wall thickness was not assessed. Right ventricular systolic function is normal. There is moderately elevated pulmonary artery systolic pressure. The tricuspid regurgitant velocity is  3.37 m/s, and with an assumed right atrial pressure of 3 mmHg, the estimated right ventricular systolic pressure is 48.4 mmHg. Left Atrium: Left atrial size was severely dilated. Right Atrium: Right atrial size was normal in size. Pericardium: Trivial pericardial effusion is present. Mitral Valve: The mitral valve is normal in structure. Moderate mitral valve regurgitation. Tricuspid Valve: The tricuspid valve is normal in structure. Tricuspid valve regurgitation is mild to moderate. Aortic Valve: The aortic valve is tricuspid. Aortic valve regurgitation is not visualized. Mild aortic valve sclerosis is present, with no evidence of aortic valve stenosis. Pulmonic Valve: The pulmonic valve was normal in structure. Pulmonic valve regurgitation is not visualized. Aorta: The aortic root is normal in size and structure. Venous: The inferior vena cava is dilated in size with greater than 50% respiratory variability, suggesting right atrial pressure of 8 mmHg. IAS/Shunts: No atrial level shunt detected by color flow Doppler.  LEFT VENTRICLE PLAX 2D LVIDd:         6.30 cm LVIDs:         4.90 cm LV PW:         1.30 cm LV IVS:        1.00 cm LVOT diam:     1.90 cm LV SV:         42 LV SV Index:   26 LVOT Area:     2.84 cm  RIGHT VENTRICLE TAPSE (M-mode): 1.5 cm LEFT ATRIUM              Index        RIGHT ATRIUM           Index LA diam:        5.90 cm  3.60 cm/m   RA Area:     15.80 cm LA Vol (A2C):   182.0 ml 110.93 ml/m RA Volume:   36.10 ml  22.00 ml/m LA Vol (A4C):   213.0 ml 129.82 ml/m LA Biplane Vol: 197.0 ml 120.07 ml/m  AORTIC VALVE LVOT Vmax:   94.60 cm/s LVOT Vmean:  58.100 cm/s LVOT VTI:    0.149 m  AORTA Ao Root diam: 3.50 cm MITRAL VALVE                TRICUSPID VALVE MV Area (PHT): 6.65 cm     TR Peak grad:    45.4 mmHg MV Decel Time: 114 msec     TR Vmax:        337.00 cm/s MR Peak grad: 89.9 mmHg MR Mean grad: 47.0 mmHg     SHUNTS MR Vmax:      474.00 cm/s   Systemic VTI:  0.15 m MR Vmean:     317.0 cm/s    Systemic Diam: 1.90 cm MV E velocity: 127.00 cm/s Dietrich PatesPaula Ross MD Electronically signed by Dietrich PatesPaula Ross MD Signature Date/Time: 12/28/2020/1:49:19 AM    Final     SIGNED: Kendell BaneSeyed A Jamale Spangler, MD, FHM. [facility],  Pager (please use [email or web address] to page/text) Please use Epic Secure Chat for non-urgent communication (7AM-7PM)  If 7PM-7AM, please contact night-coverage [email or web address], 01/02/2021, 9:47 AM

## 2021-01-02 NOTE — NC FL2 (Signed)
[place] MEDICAID FL2 LEVEL OF CARE SCREENING TOOL     IDENTIFICATION  Patient Name: Rebecca Huerta Birthdate: July 26, 1945 Sex: female Admission Date (Current Location): 12/31/2020  [facility] and [place] Number:  [employer] and Address:  [facility],  [address], [place] [address]      Provider Number: [id number]  Attending Physician Name and Address:  Kendell Bane, MD  Relative Name and Phone Number:  Mateo-Law,[name] (Daughter)   [phone]    Current Level of Care: Hospital Recommended Level of Care: Skilled Nursing Facility Prior Approval Number:    Date Approved/Denied: 04/07/20 PASRR Number: [id number]  Discharge Plan: SNF    Current Diagnoses: Patient Active Problem List   Diagnosis Date Noted   CHF exacerbation (HCC) 12/31/2020   Acute CHF (congestive heart failure) (HCC) 12/31/2020   Acute respiratory failure with hypoxia (HCC) 12/31/2020   Scalp laceration 04/07/2020   Leukocytosis 04/07/2020   History of CHF (congestive heart failure) 04/07/2020   Lumbar burst fracture (HCC) 04/06/2020   Cardiomyopathy (HCC) 06/09/2014   Chronic systolic heart failure (HCC) 06/09/2014   Malnutrition (HCC) 06/09/2014   Rash 06/09/2014   Anticoagulation adequate 06/09/2014   Pulmonary hypertension (HCC)    Acute on chronic systolic CHF (congestive heart failure) (HCC) 05/12/2014   Benign essential HTN 05/12/2014   Anxiety state 05/12/2014   Atrial fibrillation with RVR (HCC) 05/12/2014   Postoperative pulmonary edema (HCC) 05/12/2014   Pleural effusion 05/12/2014   Hypokalemia 05/12/2014   Hyponatremia 05/12/2014   A-fib (HCC) 05/11/2014   Edema leg 05/11/2014   Pulmonary edema 05/11/2014   Tobacco abuse 05/11/2014   Alcohol abuse 05/11/2014    Orientation RESPIRATION BLADDER Height & Weight     Self, Time, Situation, Place  Normal Continent Weight: 127 lb 10.3 oz (57.9 kg) Height:  5\' 4"  (162.6 cm)  BEHAVIORAL SYMPTOMS/MOOD  NEUROLOGICAL BOWEL NUTRITION STATUS      Continent Diet (Diet Heart Room service appropriate? Yes; Fluid consistency: Thin; Fluid restriction: 1500 mL Fluid (Order ))  AMBULATORY STATUS COMMUNICATION OF NEEDS Skin   Extensive Assist Verbally Normal                       Personal Care Assistance Level of Assistance  Bathing, Feeding, Dressing Bathing Assistance: Maximum assistance Feeding assistance: Independent Dressing Assistance: Limited assistance     Functional Limitations Info  Sight, Hearing, Speech Sight Info: Adequate Hearing Info: Adequate Speech Info: Adequate    SPECIAL CARE FACTORS FREQUENCY  PT (By licensed PT)     PT Frequency: 5x              Contractures Contractures Info: Not present    Additional Factors Info  Code Status, Allergies Code Status Info: DNR Allergies Info: Penicillins           Current Medications (01/02/2021):  This is the current hospital active medication list Current Facility-Administered Medications  Medication Dose Route Frequency Provider Last Rate Last Admin   acetaminophen (TYLENOL) tablet 650 mg  650 mg Oral Q6H PRN Zierle-Ghosh, Asia B, DO       Or   acetaminophen (TYLENOL) suppository 650 mg  650 mg Rectal Q6H PRN Zierle-Ghosh, Asia B, DO       ALPRAZolam (XANAX) tablet 0.25 mg  0.25 mg Oral TID PRN Shahmehdi, Seyed A, MD   0.25 mg at 01/02/21 0952   budesonide (PULMICORT) nebulizer solution 0.25 mg  0.25 mg Nebulization BID 01/04/21, MD  0.25 mg at 01/02/21 0745   carvedilol (COREG) tablet 25 mg  25 mg Oral BID WC Shahmehdi, Seyed A, MD   25 mg at 01/02/21 5188   furosemide (LASIX) tablet 40 mg  40 mg Oral Daily Shahmehdi, Seyed A, MD   40 mg at 01/02/21 0956   guaiFENesin (MUCINEX) 12 hr tablet 1,200 mg  1,200 mg Oral BID Erick Blinks, MD   1,200 mg at 01/02/21 0953   ipratropium-albuterol (DUONEB) 0.5-2.5 (3) MG/3ML nebulizer solution 3 mL  3 mL Nebulization Q6H WA Erick Blinks, MD   3 mL at  01/02/21 1335   morphine 2 MG/ML injection 2 mg  2 mg Intravenous Q2H PRN Zierle-Ghosh, Asia B, DO       nicotine (NICODERM CQ - dosed in mg/24 hours) patch 21 mg  21 mg Transdermal Daily Zierle-Ghosh, Asia B, DO   21 mg at 01/02/21 0955   ondansetron (ZOFRAN) tablet 4 mg  4 mg Oral Q6H PRN Zierle-Ghosh, Asia B, DO       Or   ondansetron (ZOFRAN) injection 4 mg  4 mg Intravenous Q6H PRN Zierle-Ghosh, Asia B, DO   4 mg at 12/31/20 1658   oxyCODONE (Oxy IR/ROXICODONE) immediate release tablet 5 mg  5 mg Oral Q4H PRN Zierle-Ghosh, Asia B, DO       polyethylene glycol (MIRALAX / GLYCOLAX) packet 17 g  17 g Oral Daily PRN Zierle-Ghosh, Asia B, DO       [START ON 01/03/2021] potassium chloride (KLOR-CON) CR tablet 10 mEq  10 mEq Oral Daily Shahmehdi, Seyed A, MD       QUEtiapine (SEROQUEL) tablet 50 mg  50 mg Oral QHS Shahmehdi, Seyed A, MD       Rivaroxaban (XARELTO) tablet 15 mg  15 mg Oral Q supper Zierle-Ghosh, Asia B, DO   15 mg at 01/01/21 1736   sacubitril-valsartan (ENTRESTO) 24-26 mg per tablet  1 tablet Oral BID Nevin Bloodgood A, MD   1 tablet at 01/02/21 4166   thiamine tablet 100 mg  100 mg Oral Daily Zierle-Ghosh, Asia B, DO   100 mg at 01/02/21 0630     Discharge Medications: Please see discharge summary for a list of discharge medications.  Relevant Imaging Results:  Relevant Lab Results:   Additional Information PT SSN: [id number]  Barry Brunner, LCSW

## 2021-01-03 ENCOUNTER — Inpatient Hospital Stay (HOSPITAL_COMMUNITY): Payer: PPO

## 2021-01-03 ENCOUNTER — Inpatient Hospital Stay (HOSPITAL_COMMUNITY)
Admit: 2021-01-03 | Discharge: 2021-01-03 | Disposition: A | Discharge status: Home / Self Care | Payer: PPO | Attending: Neurology | Admitting: Neurology

## 2021-01-03 DIAGNOSIS — R29898 Other symptoms and signs involving the musculoskeletal system: Secondary | ICD-10-CM

## 2021-01-03 DIAGNOSIS — R4701 Aphasia: Secondary | ICD-10-CM

## 2021-01-03 DIAGNOSIS — F03918 Unspecified dementia, unspecified severity, with other behavioral disturbance: Secondary | ICD-10-CM

## 2021-01-03 DIAGNOSIS — F0391 Unspecified dementia with behavioral disturbance: Secondary | ICD-10-CM

## 2021-01-03 DIAGNOSIS — R4781 Slurred speech: Secondary | ICD-10-CM

## 2021-01-03 LAB — BASIC METABOLIC PANEL
Anion gap: 7 (ref 5–15)
BUN: 26 mg/dL — ABNORMAL HIGH (ref 8–23)
CO2: 28 mmol/L (ref 22–32)
Calcium: 8.6 mg/dL — ABNORMAL LOW (ref 8.9–10.3)
Chloride: 105 mmol/L (ref 98–111)
Creatinine, Ser: 1.2 mg/dL — ABNORMAL HIGH (ref 0.44–1.00)
GFR, Estimated: 47 mL/min — ABNORMAL LOW (ref >60)
Glucose, Bld: 88 mg/dL (ref 70–99)
Potassium: 3.5 mmol/L (ref 3.5–5.1)
Sodium: 140 mmol/L (ref 135–145)

## 2021-01-03 LAB — VITAMIN B12: Vitamin B-12: 450 pg/mL (ref 180–914)

## 2021-01-03 LAB — LIPID PANEL
Cholesterol: 177 mg/dL (ref 0–200)
HDL: 44 mg/dL (ref >40)
LDL Cholesterol: 111 mg/dL — ABNORMAL HIGH (ref 0–99)
Total CHOL/HDL Ratio: 4 RATIO
Triglycerides: 109 mg/dL (ref <150)
VLDL: 22 mg/dL (ref 0–40)

## 2021-01-03 LAB — TSH: TSH: 1.008 u[IU]/mL (ref 0.350–4.500)

## 2021-01-03 LAB — GLUCOSE, CAPILLARY: Glucose-Capillary: 135 mg/dL — ABNORMAL HIGH (ref 70–99)

## 2021-01-03 LAB — SARS CORONAVIRUS 2 (TAT 6-24 HRS): SARS Coronavirus 2: NEGATIVE

## 2021-01-03 MED ORDER — RIVAROXABAN 15 MG PO TABS
15.0000 mg | ORAL_TABLET | Freq: Every day | ORAL | Status: DC
  Administered 2021-01-04: 15 mg via ORAL
  Filled 2021-01-03: qty 1

## 2021-01-03 MED ORDER — CARVEDILOL 25 MG PO TABS: 25.0000 mg | ORAL_TABLET | Freq: Two times a day (BID) | ORAL | 1 refills | Status: AC

## 2021-01-03 MED ORDER — SODIUM CHLORIDE 0.9 % IV SOLN: INTRAVENOUS | Status: DC

## 2021-01-03 MED ORDER — FUROSEMIDE 40 MG PO TABS
40.0000 mg | ORAL_TABLET | Freq: Every day | ORAL | 1 refills | Status: DC
Start: 2021-01-03 — End: 2021-01-18

## 2021-01-03 MED ORDER — SACUBITRIL-VALSARTAN 24-26 MG PO TABS: 1.0000 | ORAL_TABLET | Freq: Two times a day (BID) | ORAL | 1 refills | Status: AC

## 2021-01-03 MED ORDER — GUAIFENESIN ER 600 MG PO TB12: 1200.0000 mg | ORAL_TABLET | Freq: Two times a day (BID) | ORAL | 0 refills | Status: DC

## 2021-01-03 MED ORDER — HALOPERIDOL LACTATE 5 MG/ML IJ SOLN
2.0000 mg | Freq: Once | INTRAMUSCULAR | Status: AC
  Administered 2021-01-03: 2 mg via INTRAVENOUS
  Filled 2021-01-03: qty 1

## 2021-01-03 MED ORDER — QUETIAPINE FUMARATE 50 MG PO TABS: 50.0000 mg | ORAL_TABLET | Freq: Every day | ORAL | 0 refills | Status: DC

## 2021-01-03 MED ORDER — QUETIAPINE FUMARATE 50 MG PO TABS: 50.0000 mg | ORAL_TABLET | Freq: Every day | ORAL | 0 refills | Status: AC

## 2021-01-03 MED ORDER — POLYETHYLENE GLYCOL 3350 17 G PO PACK: 17.0000 g | PACK | Freq: Every day | ORAL | 0 refills | Status: AC | PRN

## 2021-01-03 MED ORDER — ALPRAZOLAM 0.25 MG PO TABS: 0.2500 mg | ORAL_TABLET | Freq: Three times a day (TID) | ORAL | 0 refills | Status: DC | PRN

## 2021-01-03 MED ORDER — BUDESONIDE 0.25 MG/2ML IN SUSP: 0.2500 mg | Freq: Two times a day (BID) | RESPIRATORY_TRACT | 12 refills | Status: AC

## 2021-01-03 MED ORDER — IPRATROPIUM-ALBUTEROL 0.5-2.5 (3) MG/3ML IN SOLN
3.0000 mL | Freq: Two times a day (BID) | RESPIRATORY_TRACT | Status: DC
  Administered 2021-01-04 – 2021-01-05 (×3): 3 mL via RESPIRATORY_TRACT
  Filled 2021-01-03 (×3): qty 3

## 2021-01-03 MED ORDER — RIVAROXABAN 15 MG PO TABS: 15.0000 mg | ORAL_TABLET | Freq: Every day | ORAL | Status: DC

## 2021-01-03 MED ORDER — IPRATROPIUM-ALBUTEROL 0.5-2.5 (3) MG/3ML IN SOLN: 3.0000 mL | Freq: Four times a day (QID) | RESPIRATORY_TRACT | 1 refills | Status: DC

## 2021-01-03 MED ORDER — NICOTINE 21 MG/24HR TD PT24: 21.0000 mg | MEDICATED_PATCH | Freq: Every day | TRANSDERMAL | 0 refills | Status: AC

## 2021-01-03 MED ORDER — IOHEXOL 350 MG/ML SOLN
100.0000 mL | Freq: Once | INTRAVENOUS | Status: AC | PRN
  Administered 2021-01-03: 100 mL via INTRAVENOUS

## 2021-01-03 NOTE — TOC Progression Note (Signed)
Transition of Care [facility]) - Progression Note    Patient Details  Name: Rebecca Huerta MRN: [id number] Date of Birth: 06/15/1945  [facility] CM/SW Contact  Karn Cass[place], [place] Phone Number: 01/03/2021, 3:45 PM  Clinical Narrative:   LCSW spoke with pt's daughter several times today updating with bed offers. She accepted [facility]. LCSW called [facility] to update. Call received from [facility] with [facility] stating that pt was denied and MD could complete peer-to-peer with Dr. Jacob Moores [phone]) by noon tomorrow. MD notified. LCSW updated pt's daughter as well. Code stroke called this afternoon. LCSW will follow up in AM.     Expected Discharge Plan: Skilled Nursing Facility Barriers to Discharge: Continued Medical Work up  Expected Discharge Plan and Services Expected Discharge Plan: Skilled Nursing Facility In-house Referral: Clinical Social Work Discharge Planning Services: CM Consult Post Acute Care Choice: Home Health Living arrangements for the past 2 months: Single Family Home Expected Discharge Date: 01/03/21                                     Social Determinants of Health (SDOH) Interventions    Readmission Risk Interventions Readmission Risk Prevention Plan 01/02/2021  Transportation Screening Complete  PCP or Specialist Appt within 5-7 Days Complete  Home Care Screening Complete  Medication Review (RN CM) Complete  Some recent data might be hidden

## 2021-01-03 NOTE — Procedures (Signed)
Patient Name: Rebecca Huerta  MRN: [id number]  Epilepsy Attending: Charlsie Quest  Referring Physician/Provider: Dr Brooke Dare Date: 01/03/2021 Duration: 23.16 mins  Patient history: 75yo F with sudden onset of right upper extremity weakness and aphasia. Eeg to evaluate for seizure  Level of alertness: Awake, asleep  AEDs during EEG study: None  Technical aspects: This EEG study was done with scalp electrodes positioned according to the 10-20 International system of electrode placement. Electrical activity was acquired at a sampling rate of 500Hz  and reviewed with a high frequency filter of 70Hz  and a low frequency filter of 1Hz . EEG data were recorded continuously and digitally stored.   Description: The posterior dominant rhythm consists of 8 Hz activity of moderate voltage (25-35 uV) seen predominantly in posterior head regions, symmetric and reactive to eye opening and eye closing. Sleep was characterized by vertex waves, sleep spindles (12 to 14 Hz), maximal frontocentral region. EEG showed continuous generalized polymorphic mixed frequencies with predominantly 5 to 8 Hz theta as well as intermittent 2-3hz  generalized delta slowing.  Hyperventilation and photic stimulation were not performed.     ABNORMALITY - Continuous slow, generalized  IMPRESSION: This study is suggestive of mild to moderate diffuse encephalopathy, nonspecific etiology. No seizures or epileptiform discharges were seen throughout the recording.  Irva Loser 

## 2021-01-03 NOTE — Progress Notes (Addendum)
Code stroke was called as per patient's daughter patient had some speech difficulty..   Vitals with BMI 01/03/2021 01/03/2021 01/03/2021  Height - - -  Weight - 125 lbs 14 oz -  BMI - 60.1 -  Systolic 093 - 235  Diastolic 96 - 89  Pulse 66 - 91   The patient was seen and examined at bedside, no focal neurological findings, negative for any asymmetric weaknesses.  Poor memory, no significant speech changes.     Physical Exam:   General:  Awake alert x2, poor long-term memory,  HEENT:  Normocephalic, PERRL, otherwise with in Normal limits   Neuro:  CNII-XII intact. , normal motor and sensation, reflexes intact  Strength symmetrical bilaterally, negative for any facial droop,  Lungs:   Clear to auscultation BL, Respirations unlabored, no wheezes / crackles  Cardio:    S1/S2, RRR, No murmure, No Rubs or Gallops   Abdomen:   Soft, non-tender, bowel sounds active all four quadrants,  no guarding or peritoneal signs.  Muscular skeletal:  Severe global generalized weaknesses, Limited exam - in bed, able to move all 4 extremities, Normal strength,  2+ pulses,  symmetric, No pitting edema  Skin:  Dry, warm to touch, negative for any Rashes,  Wounds: Please see nursing documentation        Code stroke: On-call neurologist consulted: Stat CT of the head negative Recommended MRI and EEG  Patient is already on Xarelto anticoagulated  Discussed with the patient daughter at bedside... Expectations were met, confirmed DNR/DNI status  Additional 40 minutes of critical care was spent in evaluating patient for possible stroke, discuss finding work-up with the family and patient at bedside, and nursing staff at bedside.  Dr. Roger Shelter

## 2021-01-03 NOTE — Progress Notes (Addendum)
Pt's daughter notified this nurse that pt seems to have an increase in her slurred speech from baseline and she was also concerned with her increase in coughing and choking on food while eating. Pt assessed and MD notified of both concerns. This nurse received a call in regards to pts daughters further concerns of pt not being able to communicate and get words out as she was before and she noticed that she wasn't able to open her pudding cup with ease during lunch today where as usually she doesn't have trouble opening. Pt's daughter states she noticed difficulty with her not being able to get words out on 7/24 and she noticed a difference in her fine motor skills such as opening her pudding cup with ease on 7/25. MD notified and code stroke protocol was initiated.

## 2021-01-03 NOTE — Consult Note (Signed)
[facility] Telemedicine Consult   Requesting Provider: Nevin Bloodgood Consult Participants: Myself, atrium nurse Misty Stanley, bedside nurse Lawanna Kobus, patient, daughter at bedside Location of the provider: [facility] Location of the patient: [facility]  This consult was provided via telemedicine with 2-way video and audio communication. The patient/family was informed that care would be provided in this way and agreed to receive care in this manner.    Chief Complaint: Right upper extremity weakness and aphasia  HPI: This is a 75 year old woman with past medical history significant for atrial fibrillation (last dose Xarelto 7/24 at 5 PM), anxiety, dementia, congestive heart failure, ongoing tobacco use (1 pack/day, ~40-pack-year history), combined systolic-diastolic congestive heart failure, likely COPD with acute exacerbation, hypertension, concern for dementia (memory impairment and agitation), former heavy alcohol use.  Family reports that she was speaking normally last when they left on Saturday at 8 PM (some intermittent slurred speech but no difficulty getting words out).  When they arrived on Sunday morning they noted she was having some difficulty getting her words out which seemed worse on their arrival this morning.  Additionally today she had some right upper extremity weakness, was unable to open her pudding, and her fingers kept landing into the cup.  Therefore code stroke was activated for new focal neurological signs  She was admitted on 7/22 with dyspnea and increasing edema felt to be acute decompensated heart failure, requiring BiPAP on admission, slowly weaned off to room air treated with diuretics.  Her agitation was initially managed with Ativan (7/22 1 dose of 1 mg) Xanax (0.25 mg, 3 doses on 7/23) but since this is not home medication changed to Seroquel 50 mg nightly starting 7/24  Notably she was recently evaluated by her primary care physician about 1 month ago, with  family concerned for memory complaints that have been going on for years but dramatically worsened starting sometime in May.  Neurology referral and basic metabolic work-up was refused by the patient.  Family notes that the patient had quit drinking and smoking in 2015 when she initially began to struggle with congestive heart failure.  However after about 3-1/2 years she started smoking again is currently smoking a pack a day  LKW: Saturday at 8 PM on clarification but initially reported as Sunday evening to me tpa given?: No IR Thrombectomy? No, no LVO MRS 1 to 2 (mild to slight disability) Time of teleneurologist evaluation: 2:22 PM  Past Medical History:  Diagnosis Date   A-fib (HCC)    Anxiety    CHF (congestive heart failure) (HCC)    a.  EF was previously 25-30% in 2015 and felt to be tachycardia-mediated b. EF normalized to 55-60% by echo in 2016   Hypertension    Current Outpatient Medications  Medication Instructions   acetaminophen (TYLENOL) 650 mg, Oral, Every 6 hours   ALPRAZolam (XANAX) 0.25 mg, Oral, 3 times daily PRN   budesonide (PULMICORT) 0.25 mg, Nebulization, 2 times daily   carvedilol (COREG) 12.5 mg, Oral, 2 times daily with meals   carvedilol (COREG) 25 mg, Oral, 2 times daily with meals   folic acid (FOLVITE) 1 MG tablet TAKE 1 TABLET(1 MG) BY MOUTH DAILY   furosemide (LASIX) 20 MG tablet Take on Monday, Wednesday, and Friday   furosemide (LASIX) 40 mg, Oral, Daily   guaiFENesin (MUCINEX) 1,200 mg, Oral, 2 times daily   ipratropium-albuterol (DUONEB) 0.5-2.5 (3) MG/3ML SOLN 3 mLs, Nebulization, Every 6 hours   lisinopril (ZESTRIL) 20 mg, Oral, 2 times  daily   Multiple Vitamin (MULTIVITAMIN) tablet 1 tablet, Oral, Daily   nicotine (NICODERM CQ - DOSED IN MG/24 HOURS) 21 mg, Transdermal, Daily   Omega-3 Fatty Acids (FISH OIL) 1000 MG CAPS 1 capsule, Oral, Daily   polyethylene glycol (MIRALAX / GLYCOLAX) 17 g, Oral, Daily PRN   QUEtiapine (SEROQUEL) 50 mg,  Oral, Daily at bedtime   Rivaroxaban (XARELTO) 15 mg, Oral, Daily with supper   sacubitril-valsartan (ENTRESTO) 24-26 MG 1 tablet, Oral, 2 times daily   thiamine 100 mg, Oral, Daily    Current Meds  Medication Sig   acetaminophen (TYLENOL) 325 MG tablet Take 2 tablets (650 mg total) by mouth every 6 (six) hours.   carvedilol (COREG) 12.5 MG tablet Take 1 tablet (12.5 mg total) by mouth 2 (two) times daily with a meal.   folic acid (FOLVITE) 1 MG tablet TAKE 1 TABLET(1 MG) BY MOUTH DAILY   furosemide (LASIX) 20 MG tablet Take on Monday, Wednesday, and Friday   lisinopril (ZESTRIL) 20 MG tablet Take 1 tablet (20 mg total) by mouth in the morning and at bedtime.   Multiple Vitamin (MULTIVITAMIN) tablet Take 1 tablet by mouth daily.   Omega-3 Fatty Acids (FISH OIL) 1000 MG CAPS Take 1 capsule by mouth daily.   Rivaroxaban (XARELTO) 15 MG TABS tablet Take 1 tablet (15 mg total) by mouth daily with supper.   thiamine 100 MG tablet Take 1 tablet (100 mg total) by mouth daily.       Exam: Vitals:   01/03/21 1413 01/03/21 1507  BP: (!) 113/96   Pulse: 66   Resp:    Temp:    SpO2:  97%    General: Awake, alert, comfortable and does not appear to be in acute distress Pulmonary: breathing comfortably Cardiac: Irregular rate and rhythm on monitor   NIH Stroke scale 1A: Level of Consciousness - 0 1B: Ask Month and Age - 2 1C: 'Blink Eyes' & 'Squeeze Hands' - 0 2: Test Horizontal Extraocular Movements - 0 3: Test Visual Fields - 0 4: Test Facial Palsy - 0 5A: Test Left Arm Motor Drift - 0 5B: Test Right Arm Motor Drift - 1 6A: Test Left Leg Motor Drift - 0 6B: Test Right Leg Motor Drift - 0 7: Test Limb Ataxia - 0 8: Test Sensation - 0 9: Test Language/Aphasia- 1 (difficulty repeating complex sentences such as "no ifs, ands or buts" difficulty naming watch) 10: Test Dysarthria - 1 11: Test Extinction/Inattention - 0 NIHSS score: 4   Imaging Reviewed:  Head CT without clear  acute intracranial process though there is an age-indeterminate right thalamic hypodensity new from last prior scan 8 to 9 months ago, as well as a significant burden of chronic microvascular changes that may be masking an acute process CTA personally reviewed, no LVO or hemodynamically significant stenosis CT perfusion personally reviewed, artifactual area of "tissue at risk "identified by RAPID analysis which does not represent acute ischemia given its location  Labs reviewed in epic and pertinent values follow: Creatinine 1.2 Glucose 88  No results found for: VITAMINB12 Lab Results  Component Value Date   TSH 3.260 05/11/2014   No results found for: RPR No results found for: HIV1X2   ECHO 12/27/2020   Assessment: This is a 75 year old woman with stroke risk factors as detailed above new neurological deficits.  Although her NIH stroke scale is low given her symptoms have been worsening there is concern for potential large vessel occlusion with failing collaterals.  Therefore CTA and CT perfusion were obtained to definitively rule out any intervenable lesion.  Given she just received Xarelto less than 24 hours ago, she is not a tPA candidate.  The differential is broad and includes delirium/progression of dementia (diagnosis of exclusion), new stroke/TIA, focal seizures  Code stroke recommendations: CTA/CT perfusion initially given cortical signs of aphasia with right upper extremity weakness, negative as detailed above  Recommendations:  #Possible TIA versus delirium/recrudescence - Stroke labs HgbA1c, fasting lipid panel - MRI brain w/o  - Frequent neuro checks - Echocardiogram already completed as above - Holding anticoagulation pending MRI confirmation of no acute intracranial process  -Please restart Xarelto if MRI confirms no acute intracranial process, in the case of acute stroke, please start aspirin 325 mg starting tomorrow until cleared by neurology to resume  anticoagulation - Risk factor modification - Telemetry monitoring - Blood pressure goal   - Permissive hypertension to 220/120 pending MRI brain, normotension if no acute intracranial process - PT consult, OT consult, Speech consult, unless patient is back to baseline  # Dementia/delirium - EEG - MRI as above - B12, TSH, RPR, HIV (no utility in checking thiamine given patient is on this chronically)  Inpatient neurology (Dr. Gerilyn Pilgrim) to follow  This patient is receiving care for possible acute neurological changes. There was 80 minutes of care by this provider at the time of service, including time for direct evaluation via telemedicine, review of medical records, imaging studies and discussion of findings with providers, the patient and family.  Brooke Dare MD-PhD [facility] [phone] If 8pm-8am, please page neurology on call as listed in [facility].

## 2021-01-03 NOTE — Progress Notes (Signed)
Code stroke CT times  1407 overhead page 1430 exam started 1431 exam finished 1433 exam complete in EPIC 1433 [place] radiology called

## 2021-01-03 NOTE — Care Management Important Message (Signed)
Important Message  Patient Details  Name: Rebecca Huerta MRN: [id number] Date of Birth: May 19, 1946   Medicare Important Message Given:  Yes     Corey Harold 01/03/2021, 11:50 AM

## 2021-01-03 NOTE — Discharge Summary (Signed)
Physician Discharge Summary [facility]    Patient: Rebecca Huerta                   Admit date: 12/31/2020   DOB: 17-Aug-1945             Discharge date:01/03/2021/8:00 AM RUE:[id number]                          PCP: Delorse Lek, MD  Disposition: SNF   Recommendations for Outpatient Follow-up:   Follow up: Cardiologist-3 weeks Follow PCP 3-6 weeks Continue current recommended medications including diuretics, newly added medication of Entresto by cardiologist Patient will need referral to psychiatrist, neurologist for confusion, memory issues  Discharge Condition: Stable   Code Status:   Code Status: DNR  Diet recommendation: Regular healthy diet   Discharge Diagnoses:    Active Problems:   A-fib (HCC)   Tobacco abuse   Acute on chronic systolic CHF (congestive heart failure) (HCC)   Benign essential HTN   Hypokalemia   Pulmonary hypertension (HCC)   CHF exacerbation (HCC)   Acute CHF (congestive heart failure) (HCC)   Acute respiratory failure with hypoxia (HCC)   History of Present Illness/ Hospital Course Charline Bills Summary:    Rebecca Huerta  is a 75 y.o. female, with history of A. fib, anxiety, dementia, CHF, tobacco use disorder, and presents the ED with a chief complaint of dyspnea.   Patient has known history of CHF and recently had an echo 4 days ago.   Daughter reports the patient has had increasing her peripheral edema over the last 5 or 6 days.  At home she is on 20 mg of Lasix 3 times weekly-MWF.  She has been compliant with this medication.  Last 2 days she has had an increase in her shortness of breath as well.  This evening the family noticed increased work of breathing, patient nodding off and then waking up gasping for air.          Acute on chronic combined systolic-diastolic CHF. -Much improved responded well to current treatment during this hospitalization Back to baseline, off oxygen  -Last echo reviewed, EF 40% -Status post treatment  with IV Lasix, will switch to p.o. today 01/02/2021 -Continue on Coreg - Entresto started 01/01/2021-tolerating well -Appreciate cardiology --- recommended continue diuretics, initiating Entresto 24/26 mg twice daily (Discontinued lisinopril)   Acute respiratory failure with hypoxia -Much improved, patient has been successfully weaned off supplemental oxygen, currently on room air, satting 98% -Due to CHF/COPD -Patient required BiPAP on admission -She has not been weaned down to nasal cannula -Continue to monitor -per Daughter not O2 dependent at home, Was on O2 up to 4 L, was subsequently weaned off   COPD -She has long history of tobacco use--refusing to quit -She has not been formally diagnosed with COPD, she has not had a PFTs -Continue inhalers -If she develops significant wheezing, will likely start IV steroids--we will taper down slowly -was  requiring 3 L of oxygen, currently satting 100% (goal to maintain 88-92%) Weaned off supplemental oxygen, currently on room air, satting 98%   Permanent atrial fibrillation -Currently rate controlled, remained stable -Anticoagulated with Xarelto   Hypertension -Much improved, stable -Currently on Coreg -Discontinued lisinopril, -Entresto 24/26 mg p.o. twice daily 8 01/01/2021   Dementia -She is having periods worsening confusion, with mild agitation -Ativan discontinued, continue Xanax 0.25 mg 3 times daily as needed -Seroquel 50 mg p.o. nightly initiated  01/02/2021   Goals of care -Discussed with patient's daughter who is her POA at bedside -She admits that patient would want to be DNR   Severe generalized global weakness PT/OT evaluation >>> recommend SNF family agreeable - daughter is agreeable with the plan -Social worker consulted t   ---------------------------------------------------------------------------------------------------------------------------------------------- Cultures; None   Antimicrobials: None      Consultants: Cardiology     ------------------------------------------------------------------------------------------------------------------------------------------------  Code Status:   Code Status: DNR   Family Communication: Discussed with daughter at bedside in detail The above findings and plan of care has been discussed with patient (and family)  in detail, they expressed understanding and agreement of above. -Advance care planning has been discussed.     Dispo: The patient is from: Home              Anticipated d/c is to SNF     Discharge Instructions:   Discharge Instructions     Activity as tolerated - No restrictions   Complete by: As directed    Call MD for:  difficulty breathing, headache or visual disturbances   Complete by: As directed    Call MD for:  extreme fatigue   Complete by: As directed    Call MD for:  hives   Complete by: As directed    Call MD for:  persistant dizziness or light-headedness   Complete by: As directed    Call MD for:  persistant nausea and vomiting   Complete by: As directed    Call MD for:  severe uncontrolled pain   Complete by: As directed    Call MD for:  temperature >100.4   Complete by: As directed    Diet - low sodium heart healthy   Complete by: As directed    Discharge instructions   Complete by: As directed    Continue with current recommended medication, recommending daily weight Follow-up with PCP in 1 week Follow-up with cardiology 2-4 weeks   Increase activity slowly   Complete by: As directed         Medication List     STOP taking these medications    folic acid 1 MG tablet Commonly known as: FOLVITE   lisinopril 20 MG tablet Commonly known as: ZESTRIL   thiamine 100 MG tablet       TAKE these medications    acetaminophen 325 MG tablet Commonly known as: TYLENOL Take 2 tablets (650 mg total) by mouth every 6 (six) hours.   ALPRAZolam 0.25 MG tablet Commonly known as: XANAX Take 1  tablet (0.25 mg total) by mouth 3 (three) times daily as needed for anxiety.   budesonide 0.25 MG/2ML nebulizer solution Commonly known as: PULMICORT Take 2 mLs (0.25 mg total) by nebulization 2 (two) times daily.   carvedilol 25 MG tablet Commonly known as: COREG Take 1 tablet (25 mg total) by mouth 2 (two) times daily with a meal. What changed:  medication strength how much to take   Fish Oil 1000 MG Caps Take 1 capsule by mouth daily.   furosemide 40 MG tablet Commonly known as: LASIX Take 1 tablet (40 mg total) by mouth daily. What changed:  medication strength how much to take how to take this when to take this additional instructions   guaiFENesin 600 MG 12 hr tablet Commonly known as: MUCINEX Take 2 tablets (1,200 mg total) by mouth 2 (two) times daily.   ipratropium-albuterol 0.5-2.5 (3) MG/3ML Soln Commonly known as: DUONEB Take 3 mLs by nebulization every 6 (  six) hours.   multivitamin tablet Take 1 tablet by mouth daily.   nicotine 21 mg/24hr patch Commonly known as: NICODERM CQ - dosed in mg/24 hours Place 1 patch (21 mg total) onto the skin daily.   polyethylene glycol 17 g packet Commonly known as: MIRALAX / GLYCOLAX Take 17 g by mouth daily as needed for mild constipation.   QUEtiapine 50 MG tablet Commonly known as: SEROQUEL Take 1 tablet (50 mg total) by mouth at bedtime.   Rivaroxaban 15 MG Tabs tablet Commonly known as: XARELTO Take 1 tablet (15 mg total) by mouth daily with supper.   sacubitril-valsartan 24-26 MG Commonly known as: ENTRESTO Take 1 tablet by mouth 2 (two) times daily.        Allergies  Allergen Reactions   Penicillins Rash     Procedures /Studies:   DG Chest Port 1 View  Result Date: 12/31/2020 CLINICAL DATA:  Dyspnea EXAM: PORTABLE CHEST 1 VIEW COMPARISON:  11/23/2020, 04/06/2020 FINDINGS: The lungs are symmetrically well expanded. Chronic underlying parenchymal interstitial changes are again identified,  however, there are superimposed interstitial infiltrates now present likely representing mild superimposed interstitial pulmonary edema. The cardiac size is mildly enlarged, similar to prior examination, but progressive since 04/06/2020. No pneumothorax or pleural effusion. No acute bone abnormality. IMPRESSION: Mild cardiogenic failure superimposed upon underlying mild chronic interstitial lung disease. Electronically Signed   By: Helyn Numbers MD   On: 12/31/2020 04:05   ECHOCARDIOGRAM COMPLETE  Result Date: 12/28/2020    ECHOCARDIOGRAM REPORT   Patient Name:   Jayde Keckler Date of Exam: 12/27/2020 Medical Rec #:  [id number]   Height:       64.0 in Accession #:    [id number]  Weight:       132.2 lb Date of Birth:  July 27, 1945   BSA:          1.641 m Patient Age:    74 years    BP:           160/110 mmHg Patient Gender: F           HR:           65 bpm. [facility] Procedure: 2D Echo, Cardiac Doppler and Color Doppler Indications:    R06.02 (ICD-10-CM) - SOB (shortness of breath)  History:        Patient has prior history of Echocardiogram examinations, most                 recent 04/20/2015. CHF and Cardiomyopathy, COPD,                 Arrythmias:Atrial Fibrillation; Risk Factors:Former Smoker and                 Hypertension. ETOH.  Sonographer:    Celesta Gentile RCS Referring Phys: [id number] Dorothe Pea BRANCH IMPRESSIONS  1. The apical viiews are somehat foreshortend There is diffuse hypokinesis, worse in the basal inferior and inferoseptal walls . Left ventricular ejection fraction, by estimation, is 40%%. The left ventricle demonstrates global hypokinesis. The left ventricular internal cavity size was severely dilated. There is mild left ventricular hypertrophy. Left ventricular diastolic parameters are indeterminate.  2. Right ventricular systolic function is normal. The right ventricular size is normal. There is moderately elevated pulmonary artery systolic pressure.  3. Left atrial size was  severely dilated.  4. The mitral valve is normal in structure. Moderate mitral valve regurgitation.  5. Tricuspid valve regurgitation is mild to moderate.  6. The aortic valve is tricuspid. Aortic valve regurgitation is not visualized. Mild aortic valve sclerosis is present, with no evidence of aortic valve stenosis.  7. The inferior vena cava is dilated in size with >50% respiratory variability, suggesting right atrial pressure of 8 mmHg. Comparison(s): No prior Echocardiogram. FINDINGS  Left Ventricle: The apical viiews are somehat foreshortend There is diffuse hypokinesis, worse in the basal inferior and inferoseptal walls. Left ventricular ejection fraction, by estimation, is 40%%. The left ventricle demonstrates global hypokinesis. The left ventricular internal cavity size was severely dilated. There is mild left ventricular hypertrophy. Left ventricular diastolic parameters are indeterminate. Right Ventricle: The right ventricular size is normal. Right vetricular wall thickness was not assessed. Right ventricular systolic function is normal. There is moderately elevated pulmonary artery systolic pressure. The tricuspid regurgitant velocity is  3.37 m/s, and with an assumed right atrial pressure of 3 mmHg, the estimated right ventricular systolic pressure is 48.4 mmHg. Left Atrium: Left atrial size was severely dilated. Right Atrium: Right atrial size was normal in size. Pericardium: Trivial pericardial effusion is present. Mitral Valve: The mitral valve is normal in structure. Moderate mitral valve regurgitation. Tricuspid Valve: The tricuspid valve is normal in structure. Tricuspid valve regurgitation is mild to moderate. Aortic Valve: The aortic valve is tricuspid. Aortic valve regurgitation is not visualized. Mild aortic valve sclerosis is present, with no evidence of aortic valve stenosis. Pulmonic Valve: The pulmonic valve was normal in structure. Pulmonic valve regurgitation is not visualized. Aorta: The  aortic root is normal in size and structure. Venous: The inferior vena cava is dilated in size with greater than 50% respiratory variability, suggesting right atrial pressure of 8 mmHg. IAS/Shunts: No atrial level shunt detected by color flow Doppler.  LEFT VENTRICLE PLAX 2D LVIDd:         6.30 cm LVIDs:         4.90 cm LV PW:         1.30 cm LV IVS:        1.00 cm LVOT diam:     1.90 cm LV SV:         42 LV SV Index:   26 LVOT Area:     2.84 cm  RIGHT VENTRICLE TAPSE (M-mode): 1.5 cm LEFT ATRIUM              Index        RIGHT ATRIUM           Index LA diam:        5.90 cm  3.60 cm/m   RA Area:     15.80 cm LA Vol (A2C):   182.0 ml 110.93 ml/m RA Volume:   36.10 ml  22.00 ml/m LA Vol (A4C):   213.0 ml 129.82 ml/m LA Biplane Vol: 197.0 ml 120.07 ml/m  AORTIC VALVE LVOT Vmax:   94.60 cm/s LVOT Vmean:  58.100 cm/s LVOT VTI:    0.149 m  AORTA Ao Root diam: 3.50 cm MITRAL VALVE                TRICUSPID VALVE MV Area (PHT): 6.65 cm     TR Peak grad:   45.4 mmHg MV Decel Time: 114 msec     TR Vmax:        337.00 cm/s MR Peak grad: 89.9 mmHg MR Mean grad: 47.0 mmHg     SHUNTS MR Vmax:      474.00 cm/s   Systemic VTI:  0.15 m MR Vmean:  317.0 cm/s    Systemic Diam: 1.90 cm MV E velocity: 127.00 cm/s Dietrich Pates MD Electronically signed by Dietrich Pates MD Signature Date/Time: 12/28/2020/1:49:19 AM    Final     Subjective:   Patient was seen and examined 01/03/2021, 8:00 AM Patient stable today. No acute distress.  No issues overnight Stable for discharge.  Discharge Exam:    Vitals:   01/03/21 0617 01/03/21 0645 01/03/21 0756 01/03/21 0758  BP: 120/89     Pulse: 91     Resp: 18     Temp: 97.9 F (36.6 C)     TempSrc: Oral     SpO2: 95%  100% 100%  Weight:  57.1 kg    Height:        General: Pt lying comfortably in bed & appears in no obvious distress. Cardiovascular: S1 & S2 heard, RRR, S1/S2 +. No murmurs, rubs, gallops or clicks. No JVD or pedal edema. Respiratory: Clear to auscultation  without wheezing, rhonchi or crackles. No increased work of breathing. Abdominal:  Non-distended, non-tender & soft. No organomegaly or masses appreciated. Normal bowel sounds heard. CNS: Alert and oriented. No focal deficits. Extremities: no edema, no cyanosis      The results of significant diagnostics from this hospitalization (including imaging, microbiology, ancillary and laboratory) are listed below for reference.      Microbiology:   Recent Results (from the past 240 hour(s))  Resp Panel by RT-PCR (Flu A&B, Covid) Nasopharyngeal Swab     Status: None   Collection Time: 12/31/20  3:50 AM   Specimen: Nasopharyngeal Swab; Nasopharyngeal(NP) swabs in vial transport medium  Result Value Ref Range Status   SARS Coronavirus 2 by RT PCR NEGATIVE NEGATIVE Final    Comment: (NOTE) SARS-CoV-2 target nucleic acids are NOT DETECTED.  The SARS-CoV-2 RNA is generally detectable in upper respiratory specimens during the acute phase of infection. The lowest concentration of SARS-CoV-2 viral copies this assay can detect is 138 copies/mL. A negative result does not preclude SARS-Cov-2 infection and should not be used as the sole basis for treatment or other patient management decisions. A negative result may occur with  improper specimen collection/handling, submission of specimen other than nasopharyngeal swab, presence of viral mutation(s) within the areas targeted by this assay, and inadequate number of viral copies(<138 copies/mL). A negative result must be combined with clinical observations, patient history, and epidemiological information. The expected result is Negative.  Fact Sheet for Patients:  BloggerCourse.com  Fact Sheet for Healthcare Providers:  SeriousBroker.it  This test is no t yet approved or cleared by the [place] FDA and  has been authorized for detection and/or diagnosis of SARS-CoV-2 by FDA under an  Emergency Use Authorization (EUA). This EUA will remain  in effect (meaning this test can be used) for the duration of the COVID-19 declaration under Section 564(b)(1) of the Act, 21 U.S.C.section 360bbb-3(b)(1), unless the authorization is terminated  or revoked sooner.       Influenza A by PCR NEGATIVE NEGATIVE Final   Influenza B by PCR NEGATIVE NEGATIVE Final    Comment: (NOTE) The Xpert Xpress SARS-CoV-2/FLU/RSV plus assay is intended as an aid in the diagnosis of influenza from Nasopharyngeal swab specimens and should not be used as a sole basis for treatment. Nasal washings and aspirates are unacceptable for Xpert Xpress SARS-CoV-2/FLU/RSV testing.  Fact Sheet for Patients: BloggerCourse.com  Fact Sheet for Healthcare Providers: SeriousBroker.it  This test is not yet approved or cleared by the [place] FDA  and has been authorized for detection and/or diagnosis of SARS-CoV-2 by FDA under an Emergency Use Authorization (EUA). This EUA will remain in effect (meaning this test can be used) for the duration of the COVID-19 declaration under Section 564(b)(1) of the Act, 21 U.S.C. section 360bbb-3(b)(1), unless the authorization is terminated or revoked.  Performed at [facility], [address], [place], [place] [address]   SARS CORONAVIRUS 2 (TAT 6-24 HRS) Nasopharyngeal Nasopharyngeal Swab     Status: None   Collection Time: 01/02/21  6:00 PM   Specimen: Nasopharyngeal Swab  Result Value Ref Range Status   SARS Coronavirus 2 NEGATIVE NEGATIVE Final    Comment: (NOTE) SARS-CoV-2 target nucleic acids are NOT DETECTED.  The SARS-CoV-2 RNA is generally detectable in upper and lower respiratory specimens during the acute phase of infection. Negative results do not preclude SARS-CoV-2 infection, do not rule out co-infections with other pathogens, and should not be used as the sole basis for treatment or other patient  management decisions. Negative results must be combined with clinical observations, patient history, and epidemiological information. The expected result is Negative.  Fact Sheet for Patients: HairSlick.no  Fact Sheet for Healthcare Providers: quierodirigir.com  This test is not yet approved or cleared by the [place] FDA and  has been authorized for detection and/or diagnosis of SARS-CoV-2 by FDA under an Emergency Use Authorization (EUA). This EUA will remain  in effect (meaning this test can be used) for the duration of the COVID-19 declaration under Se ction 564(b)(1) of the Act, 21 U.S.C. section 360bbb-3(b)(1), unless the authorization is terminated or revoked sooner.  Performed at [facility], [address], [place], [place] [address]      Labs:   CBC: Recent Labs  Lab 12/31/20 0346 01/01/21 0529  WBC 8.9 7.7  HGB 11.8* 10.7*  HCT 39.1 36.3  MCV 89.9 90.5  PLT 246 193   Basic Metabolic Panel: Recent Labs  Lab 12/31/20 0346 01/01/21 0529 01/02/21 0804 01/03/21 0423  NA 140 142 140 140  K 3.4* 3.9 3.3* 3.5  CL 107 105 102 105  CO2 GLUCOSE 128* 92 109* 88  BUN 19 20 28* 26*  CREATININE 1.11* 1.10* 1.23* 1.20*  CALCIUM 8.8* 8.6* 8.6* 8.6*  MG  --  2.0  --   --    Liver Function Tests: Recent Labs  Lab 12/31/20 0346 01/01/21 0529  AST 33 28  ALT 35 30  ALKPHOS 74 64  BILITOT 0.6 0.8  PROT 7.1 5.9*  ALBUMIN 3.2* 2.8*   BNP (last 3 results) Recent Labs    11/23/20 1146 12/31/20 0346  BNP 788.0* 1,405.0*   Cardiac Enzymes:    Time coordinating discharge: Over 45 minutes  SIGNED: Kendell Bane, MD, FACP, FHM. [facility],  Please use [email or web address] to Page If 7PM-7AM, please contact night-coverage [email or web address], Password [facility] 01/03/2021, 8:00 AM

## 2021-01-03 NOTE — Progress Notes (Signed)
EEG completed, results pending. 

## 2021-01-04 ENCOUNTER — Encounter (HOSPITAL_COMMUNITY): Payer: Self-pay | Admitting: Family Medicine

## 2021-01-04 DIAGNOSIS — R29898 Other symptoms and signs involving the musculoskeletal system: Secondary | ICD-10-CM

## 2021-01-04 DIAGNOSIS — R4701 Aphasia: Secondary | ICD-10-CM

## 2021-01-04 DIAGNOSIS — F0391 Unspecified dementia with behavioral disturbance: Secondary | ICD-10-CM

## 2021-01-04 LAB — HIV ANTIBODY (ROUTINE TESTING W REFLEX): HIV Screen 4th Generation wRfx: NONREACTIVE

## 2021-01-04 LAB — HEMOGLOBIN A1C
Hgb A1c MFr Bld: 5.5 % (ref 4.8–5.6)
Mean Plasma Glucose: 111 mg/dL

## 2021-01-04 MED ORDER — MEMANTINE HCL 10 MG PO TABS
10.0000 mg | ORAL_TABLET | Freq: Every day | ORAL | Status: DC
  Administered 2021-01-04 – 2021-01-05 (×2): 10 mg via ORAL
  Filled 2021-01-04 (×2): qty 1

## 2021-01-04 MED ORDER — MEMANTINE HCL 10 MG PO TABS: 10.0000 mg | ORAL_TABLET | Freq: Every day | ORAL | 1 refills | Status: AC

## 2021-01-04 NOTE — TOC Progression Note (Signed)
Transition of Care [facility]) - Progression Note    Patient Details  Name: Pantera Winterrowd MRN: [id number] Date of Birth: [birth year]  [facility] CM/SW Contact  Karn Cass[place], [place] Phone Number: 01/04/2021, 4:10 PM  Clinical Narrative:  Per MD, peer-to-peer was completed earlier today and pt approved for SNF. LCSW spoke with Judeth Cornfield at [facility] several times today and just received authorization at 1610. Auth: [id number]. EMS denied by insurance. Pt's daughter notified. Per SNF, pt likely will not receive all of her meds today due to late admission. SNF told daughter and daughter not interested in pt d/c unless she can receive all meds. LCSW spoke with supervisor who advised to hold d/c until tomorrow AM. Pt's daughter, MD, RN, and SNF notified.       Expected Discharge Plan: Skilled Nursing Facility Barriers to Discharge: Continued Medical Work up, English as a second language teacher  Expected Discharge Plan and Services Expected Discharge Plan: Skilled Nursing Facility In-house Referral: Clinical Social Work Discharge Planning Services: CM Consult Post Acute Care Choice: Home Health Living arrangements for the past 2 months: Single Family Home Expected Discharge Date: 01/03/21                                     Social Determinants of Health (SDOH) Interventions    Readmission Risk Interventions Readmission Risk Prevention Plan 01/02/2021  Transportation Screening Complete  PCP or Specialist Appt within 5-7 Days Complete  Home Care Screening Complete  Medication Review (RN CM) Complete  Some recent data might be hidden

## 2021-01-04 NOTE — Evaluation (Signed)
Occupational Therapy Evaluation Patient Details Name: Rebecca Huerta MRN: [id number] DOB: 04-12-1946 Today's Date: 01/04/2021    History of Present Illness Rebecca Huerta  is a 75 y.o. female, with history of A. fib, anxiety, dementia, CHF, tobacco use disorder, and presents the ED with a chief complaint of dyspnea.  Patient has known history of CHF and recently had an echo 4 days ago.  She has not yet followed up with her cardiologist since the echo.  Daughter reports the patient has had increasing her peripheral edema over the last 5 or 6 days.  At home she is on 20 mg of Lasix 3 times weekly-MWF.  She has been compliant with this medication.  Last 2 days she has had an increase in her shortness of breath as well.  This evening the family noticed increased work of breathing, patient nodding off and then waking up gasping for air.  Decided was time to bring her into the hospital.  The ED provider reports that patient was very dyspneic speaking in only 3 or 4 word phrases upon arrival.  She had crackles on exam she was started on nitro, BiPAP, given Lasix and had symptom improvement.  Daughter reports the patient has been complaining of chest pain intermittently for about 24 hours.  Unfortunately patient cannot give details due to her dementia.  The daughter is unsure if the chest pain is exertional, but she knows that the dyspnea is worse with exertion.  Patient is able to ambulate without any assistive device at home.  She has had a decrease in her appetite that is chronic.  Last night for dinner she did have a high salt meal with hamburger and [place] fries.  Is a current smoker and and has a chronic cough.  She did choke on her dinner 2 days ago, and her cough seems to have increased since then.   Clinical Impression   Pt agreeable to OT evaluation and PT co-treatment. Pt demonstrates general B UE weakness with slow mobility using RW. Min G to Min A for sit to stand and ambulatory transfer using RW. Pt donned  2LPM O2 for duration of session without signs of desaturation. Unsure of pt level of support at home which is reasoning for SNF placement. Pt will benefit from continued OT in the hospital and recommended venue below to increase strength, balance, and endurance for safe ADL's.     Follow Up Recommendations  SNF    Equipment Recommendations  None recommended by OT           Precautions / Restrictions Precautions Precautions: Fall Restrictions Weight Bearing Restrictions: No      Mobility Bed Mobility Overal bed mobility: Needs Assistance Bed Mobility: Supine to Sit     Supine to sit: Supervision     General bed mobility comments: slightly increased time, labored movement    Transfers Overall transfer level: Needs assistance Equipment used: Rolling walker (2 wheeled);None Transfers: Sit to/from [employer] to Stand: Min assist;Min guard Stand pivot transfers: Min assist;Min guard       General transfer comment: using RW    Balance Overall balance assessment: Needs assistance Sitting-balance support: Feet supported;No upper extremity supported Sitting balance-Leahy Scale: Good Sitting balance - Comments: seated at EOB   Standing balance support: During functional activity;No upper extremity supported Standing balance-Leahy Scale: Poor Standing balance comment: fair/poor using RW  ADL either performed or assessed with clinical judgement   ADL Overall ADL's : Needs assistance/impaired                         Toilet Transfer: Min guard;Minimal assistance;RW;Ambulation Statistician Details (indicate cue type and reason): simulated via ambulatory transfer from EOB to chair using RW                 Vision Baseline Vision/History: Wears glasses Wears Glasses: At all times (contacts) Patient Visual Report: No change from baseline Vision Assessment?: Yes Eye Alignment: Within Functional  Limits Ocular Range of Motion: Within Functional Limits Tracking/Visual Pursuits: Other (comment) (Head turns during tracking.) Saccades: Other (comment) (Mild difficulty following directions likely. Troubly maintaining gaze until asked to shift focus.) Convergence: Impaired (comment) (No convergence noted.) Visual Fields: No apparent deficits                Pertinent Vitals/Pain Pain Assessment: No/denies pain     Hand Dominance Right   Extremity/Trunk Assessment Upper Extremity Assessment Upper Extremity Assessment: Generalized weakness   Lower Extremity Assessment Lower Extremity Assessment: Defer to PT evaluation   Cervical / Trunk Assessment Cervical / Trunk Assessment: Kyphotic   Communication Communication Communication: No difficulties   Cognition Arousal/Alertness: Awake/alert Behavior During Therapy: WFL for tasks assessed/performed Overall Cognitive Status: History of cognitive impairments - at baseline                                                      Home Living Family/patient expects to be discharged to:: Private residence Living Arrangements: Alone Available Help at Discharge: Friend(s);Neighbor;Available PRN/intermittently;Family Type of Home: House Home Access: Stairs to enter [employer] of Steps: 3 Entrance Stairs-Rails: None Home Layout: Two level Alternate Level Stairs-Number of Steps: has lift chair to go to 2nd floor   Bathroom Shower/Tub: Tub/shower unit;Walk-in shower   Bathroom Toilet: Standard     Home Equipment: Shower seat - built in;Walker - 2 wheels   Additional Comments: History taken from document review due to pt being poor historian.      Prior Functioning/Environment Level of Independence: Needs assistance  Gait / Transfers Assistance Needed: household ambulator without AD ADL's / Homemaking Assistance Needed: assisted by family/friends            OT Problem List: Decreased  strength;Impaired balance (sitting and/or standing)      OT Treatment/Interventions: Self-care/ADL training;Therapeutic exercise;Therapeutic activities;DME and/or AE instruction;Cognitive remediation/compensation;Patient/family education;Balance training    OT Goals(Current goals can be found in the care plan section) Acute Rehab OT Goals Patient Stated Goal: return home after rehab OT Goal Formulation: With patient Time For Goal Achievement: 01/18/21 Potential to Achieve Goals: Good  OT Frequency: Min 2X/week               Co-evaluation PT/OT/SLP Co-Evaluation/Treatment: Yes Reason for Co-Treatment: To address functional/ADL transfers   OT goals addressed during session: ADL's and self-care;Strengthening/ROM                       End of Session Equipment Utilized During Treatment: Rolling walker  Activity Tolerance: Patient tolerated treatment well Patient left: in chair;with call bell/phone within reach;with chair alarm set  OT Visit Diagnosis: Unsteadiness on feet (R26.81);Muscle weakness (generalized) (M62.81)  Time: 5009-3818 OT Time Calculation (min): 18 min Charges:  OT General Charges $OT Visit: 1 Visit OT Evaluation $OT Eval Low Complexity: 1 Low  Maksym Pfiffner OT, MOT  Danie Chandler 01/04/2021, 10:05 AM

## 2021-01-04 NOTE — Plan of Care (Signed)
  Problem: Acute Rehab OT Goals (only OT should resolve) Goal: Pt. Will Perform Grooming Flowsheets (Taken 01/04/2021 1009) Pt Will Perform Grooming:  with modified independence  standing  with adaptive equipment Goal: Pt. Will Perform Lower Body Dressing Flowsheets (Taken 01/04/2021 1009) Pt Will Perform Lower Body Dressing:  with modified independence  sitting/lateral leans  sit to/from stand  with adaptive equipment Goal: Pt. Will Transfer To Toilet Flowsheets (Taken 01/04/2021 1009) Pt Will Transfer to Toilet:  with modified independence  stand pivot transfer  ambulating Goal: Pt/Caregiver Will Perform Home Exercise Program Flowsheets (Taken 01/04/2021 1009) Pt/caregiver will Perform Home Exercise Program:  Increased strength  Both right and left upper extremity  With Supervision  Montserrat Shek OT, MOT

## 2021-01-04 NOTE — Discharge Summary (Signed)
Physician Discharge Summary [facility]    Patient: Rebecca Huerta                   Admit date: 12/31/2020   DOB: [birth year]             Discharge date:01/04/2021/11:03 AM [id number]                          PCP: Stephens Shire, MD  Disposition: SNF   Recommendations for Outpatient Follow-up:   Follow up: Cardiologist-3 weeks Follow PCP 3-6 weeks Continue current recommended medications including diuretics, newly added medication of Entresto by cardiologist Patient will need referral to psychiatrist, neurologist for confusion, memory issues  Discharge Condition: Stable   Code Status:   Code Status: DNR  Diet recommendation: Regular healthy diet   Discharge Diagnoses:    Active Problems:   A-fib (HCC)   Tobacco abuse   Acute on chronic systolic CHF (congestive heart failure) (HCC)   Benign essential HTN   Hypokalemia   Pulmonary hypertension (HCC)   CHF exacerbation (HCC)   Acute CHF (congestive heart failure) (HCC)   Acute respiratory failure with hypoxia (HCC)   Aphasia determined by examination   Right arm weakness   Dementia with behavioral disturbance ([place])   History of Present Illness/ Hospital Course Rebecca Huerta Summary:    Rebecca Huerta  is a 75 y.o. female, with history of A. fib, anxiety, dementia, CHF, tobacco use disorder, and presents the ED with a chief complaint of dyspnea.   Patient has known history of CHF and recently had an echo 4 days ago.   Daughter reports the patient has had increasing her peripheral edema over the last 5 or 6 days.  At home she is on 20 mg of Lasix 3 times weekly-MWF.  She has been compliant with this medication.  Last 2 days she has had an increase in her shortness of breath as well.  This evening the family noticed increased work of breathing, patient nodding off and then waking up gasping for air.          Acute on chronic combined systolic-diastolic CHF. -Much improved responded well to current treatment during  this hospitalization Back to baseline, off oxygen  -Last echo reviewed, EF 40% -Status post treatment with IV Lasix, will switch to p.o. today 01/02/2021 -Continue on Coreg - Entresto started 01/01/2021-tolerating well -Appreciate cardiology --- recommended continue diuretics, initiating Entresto 24/26 mg twice daily (Discontinued lisinopril)   Acute respiratory failure with hypoxia -Much improved, patient has been successfully weaned off supplemental oxygen, currently on room air, satting 98% -Due to CHF/COPD -Patient required BiPAP on admission -She has not been weaned down to nasal cannula -Continue to monitor -per Daughter not O2 dependent at home, Was on O2 up to 4 L, was subsequently weaned off   COPD -She has long history of tobacco use--refusing to quit -She has not been formally diagnosed with COPD, she has not had a PFTs -Continue inhalers -If she develops significant wheezing, will likely start IV steroids--we will taper down slowly -was  requiring 3 L of oxygen, currently satting 100% (goal to maintain 88-92%) Weaned off supplemental oxygen, currently on room air, satting 98%   Permanent atrial fibrillation -Currently rate controlled, remained stable -Anticoagulated with Xarelto   Hypertension -Much improved, stable -Currently on Coreg -Discontinued lisinopril, -Entresto 24/26 mg p.o. twice daily 8 01/01/2021   Dementia -She is having periods worsening confusion, with mild  agitation -Ativan discontinued, continue Xanax 0.25 mg 3 times daily as needed -Seroquel 50 mg p.o. nightly initiated 01/02/2021   Goals of care -Discussed with patient's daughter who is her POA at bedside -She admits that patient would want to be DNR   Severe generalized global weakness PT/OT evaluation >>> recommend SNF family agreeable - daughter is agreeable with the plan -Social worker consulted    Code stroke: 01/03/2021 due to slurred speech, fine motor deficit On-call neurologist  consulted: Negative for new focal neurological findings, stroke was ruled out Stat CT of the head negative MRI and EEG--- results were reviewed, negative for any acute findings, no signs of seizures or acute stroke   Patient is already on Xarelto anticoagulated   Discussed with the patient daughter at bedside... Expectations were met, confirmed DNR/DNI status    ----------------------------------------------------------------------------------------------------------------------------------------------  Consultants: Cardiology/telemetry neurology     ------------------------------------------------------------------------------------------------------------------------------------------------  Code Status:   Code Status: DNR   Family Communication: Discussed with daughter at bedside in detail The above findings and plan of care has been discussed with patient (and family)  in detail, they expressed understanding and agreement of above. -Advance care planning has been discussed.     Dispo: The patient is from: Home              Anticipated d/c is to SNF     Discharge Instructions:   Discharge Instructions     Activity as tolerated - No restrictions   Complete by: As directed    Call MD for:  difficulty breathing, headache or visual disturbances   Complete by: As directed    Call MD for:  extreme fatigue   Complete by: As directed    Call MD for:  hives   Complete by: As directed    Call MD for:  persistant dizziness or light-headedness   Complete by: As directed    Call MD for:  persistant nausea and vomiting   Complete by: As directed    Call MD for:  severe uncontrolled pain   Complete by: As directed    Call MD for:  temperature >100.4   Complete by: As directed    Diet - low sodium heart healthy   Complete by: As directed    Discharge instructions   Complete by: As directed    Continue with current recommended medication, recommending daily weight Follow-up  with PCP in 1 week Follow-up with cardiology 2-4 weeks   Increase activity slowly   Complete by: As directed         Medication List     STOP taking these medications    folic acid 1 MG tablet Commonly known as: FOLVITE   lisinopril 20 MG tablet Commonly known as: ZESTRIL   thiamine 100 MG tablet       TAKE these medications    acetaminophen 325 MG tablet Commonly known as: TYLENOL Take 2 tablets (650 mg total) by mouth every 6 (six) hours.   ALPRAZolam 0.25 MG tablet Commonly known as: XANAX Take 1 tablet (0.25 mg total) by mouth 3 (three) times daily as needed for anxiety.   budesonide 0.25 MG/2ML nebulizer solution Commonly known as: PULMICORT Take 2 mLs (0.25 mg total) by nebulization 2 (two) times daily.   carvedilol 25 MG tablet Commonly known as: COREG Take 1 tablet (25 mg total) by mouth 2 (two) times daily with a meal. What changed:  medication strength how much to take   Fish Oil 1000 MG Caps Take 1 capsule  by mouth daily.   furosemide 40 MG tablet Commonly known as: LASIX Take 1 tablet (40 mg total) by mouth daily. What changed:  medication strength how much to take how to take this when to take this additional instructions   guaiFENesin 600 MG 12 hr tablet Commonly known as: MUCINEX Take 2 tablets (1,200 mg total) by mouth 2 (two) times daily.   ipratropium-albuterol 0.5-2.5 (3) MG/3ML Soln Commonly known as: DUONEB Take 3 mLs by nebulization every 6 (six) hours.   memantine 10 MG tablet Commonly known as: NAMENDA Take 1 tablet (10 mg total) by mouth daily.   multivitamin tablet Take 1 tablet by mouth daily.   nicotine 21 mg/24hr patch Commonly known as: NICODERM CQ - dosed in mg/24 hours Place 1 patch (21 mg total) onto the skin daily.   polyethylene glycol 17 g packet Commonly known as: MIRALAX / GLYCOLAX Take 17 g by mouth daily as needed for mild constipation.   QUEtiapine 50 MG tablet Commonly known as: SEROQUEL Take  1 tablet (50 mg total) by mouth at bedtime.   Rivaroxaban 15 MG Tabs tablet Commonly known as: XARELTO Take 1 tablet (15 mg total) by mouth daily with supper.   sacubitril-valsartan 24-26 MG Commonly known as: ENTRESTO Take 1 tablet by mouth 2 (two) times daily.        Allergies  Allergen Reactions   Penicillins Rash     Procedures /Studies:   MR BRAIN WO CONTRAST  Result Date: 01/03/2021 CLINICAL DATA:  Neurological deficit, acute, stroke suspected. Weakness over the last 2 days. EXAM: MRI HEAD WITHOUT CONTRAST TECHNIQUE: Multiplanar, multiecho pulse sequences of the brain and surrounding structures were obtained without intravenous contrast. COMPARISON:  CT study earlier same day. FINDINGS: Brain: Diffusion imaging does not show any acute or subacute infarction. There is considerable artifact in the region of the medulla, lower pons and inferior cerebellum. There are chronic small-vessel ischemic changes of the pons. Numerous old small vessel infarctions within the inferior cerebellum. Cerebral hemispheres show chronic small-vessel ischemic changes of the thalami, basal ganglia and hemispheric white matter. No large vessel territory infarction. No mass lesion, hemorrhage, hydrocephalus or extra-axial collection. Vascular: Major vessels at the base of the brain show flow. Skull and upper cervical spine: Negative Sinuses/Orbits: Clear/normal Other: None IMPRESSION: No acute or subacute finding. Numerous old small vessel infarctions within the inferior cerebellum. Chronic small-vessel ischemic changes of the pons, thalami, basal ganglia and cerebral hemispheric white matter. Because artifact on diffusion imaging in the lower portion of the scan, presumably from dental work, a small acute infarction in the inferior cerebellum might be inapparent, but I have no specific basis upon which to suggest that. Electronically Signed   By: Nelson Chimes M.D.   On: 01/03/2021 16:12   DG Chest Port 1  View  Result Date: 12/31/2020 CLINICAL DATA:  Dyspnea EXAM: PORTABLE CHEST 1 VIEW COMPARISON:  11/23/2020, 04/06/2020 FINDINGS: The lungs are symmetrically well expanded. Chronic underlying parenchymal interstitial changes are again identified, however, there are superimposed interstitial infiltrates now present likely representing mild superimposed interstitial pulmonary edema. The cardiac size is mildly enlarged, similar to prior examination, but progressive since 04/06/2020. No pneumothorax or pleural effusion. No acute bone abnormality. IMPRESSION: Mild cardiogenic failure superimposed upon underlying mild chronic interstitial lung disease. Electronically Signed   By: Fidela Salisbury MD   On: 12/31/2020 04:05   EEG adult  Result Date: 01/03/2021 Lora Havens, MD     01/03/2021  6:03 PM Patient  Name: Rebecca Huerta MRN: [id number] Epilepsy Attending: Lora Havens Referring Physician/Provider: Dr Lesleigh Noe Date: 01/03/2021 Duration: 23.16 mins Patient history: 75yo F with sudden onset of right upper extremity weakness and aphasia. Eeg to evaluate for seizure Level of alertness: Awake, asleep AEDs during EEG study: None Technical aspects: This EEG study was done with scalp electrodes positioned according to the 10-20 International system of electrode placement. Electrical activity was acquired at a sampling rate of '500Hz'  and reviewed with a high frequency filter of '70Hz'  and a low frequency filter of '1Hz' . EEG data were recorded continuously and digitally stored. Description: The posterior dominant rhythm consists of 8 Hz activity of moderate voltage (25-35 uV) seen predominantly in posterior head regions, symmetric and reactive to eye opening and eye closing. Sleep was characterized by vertex waves, sleep spindles (12 to 14 Hz), maximal frontocentral region. EEG showed continuous generalized polymorphic mixed frequencies with predominantly 5 to 8 Hz theta as well as intermittent 2-'3hz'  generalized delta  slowing.  Hyperventilation and photic stimulation were not performed.   ABNORMALITY - Continuous slow, generalized IMPRESSION: This study is suggestive of mild to moderate diffuse encephalopathy, nonspecific etiology. No seizures or epileptiform discharges were seen throughout the recording. Lora Havens   ECHOCARDIOGRAM COMPLETE  Result Date: 12/28/2020    ECHOCARDIOGRAM REPORT   Patient Name:   Rebecca Huerta Date of Exam: 12/27/2020 Medical Rec #:  [id number]   Height:       64.0 in Accession #:    [id number]  Weight:       132.2 lb Date of Birth:  1946/05/14   BSA:          1.641 m Patient Age:    62 years    BP:           160/110 mmHg Patient Gender: F           HR:           65 bpm. Exam Location:  [facility] Procedure: 2D Echo, Cardiac Doppler and Color Doppler Indications:    R06.02 (ICD-10-CM) - SOB (shortness of breath)  History:        Patient has prior history of Echocardiogram examinations, most                 recent 04/20/2015. CHF and Cardiomyopathy, COPD,                 Arrythmias:Atrial Fibrillation; Risk Factors:Former Smoker and                 Hypertension. ETOH.  Sonographer:    Alvino Chapel RCS Referring Phys: [id number] [place]  1. The apical viiews are somehat foreshortend There is diffuse hypokinesis, worse in the basal inferior and inferoseptal walls . Left ventricular ejection fraction, by estimation, is 40%%. The left ventricle demonstrates global hypokinesis. The left ventricular internal cavity size was severely dilated. There is mild left ventricular hypertrophy. Left ventricular diastolic parameters are indeterminate.  2. Right ventricular systolic function is normal. The right ventricular size is normal. There is moderately elevated pulmonary artery systolic pressure.  3. Left atrial size was severely dilated.  4. The mitral valve is normal in structure. Moderate mitral valve regurgitation.  5. Tricuspid valve regurgitation is mild to moderate.  6. The aortic  valve is tricuspid. Aortic valve regurgitation is not visualized. Mild aortic valve sclerosis is present, with no evidence of aortic valve stenosis.  7. The inferior vena cava is dilated in size  with >50% respiratory variability, suggesting right atrial pressure of 8 mmHg. Comparison(s): No prior Echocardiogram. FINDINGS  Left Ventricle: The apical viiews are somehat foreshortend There is diffuse hypokinesis, worse in the basal inferior and inferoseptal walls. Left ventricular ejection fraction, by estimation, is 40%%. The left ventricle demonstrates global hypokinesis. The left ventricular internal cavity size was severely dilated. There is mild left ventricular hypertrophy. Left ventricular diastolic parameters are indeterminate. Right Ventricle: The right ventricular size is normal. Right vetricular wall thickness was not assessed. Right ventricular systolic function is normal. There is moderately elevated pulmonary artery systolic pressure. The tricuspid regurgitant velocity is  3.37 m/s, and with an assumed right atrial pressure of 3 mmHg, the estimated right ventricular systolic pressure is 73.2 mmHg. Left Atrium: Left atrial size was severely dilated. Right Atrium: Right atrial size was normal in size. Pericardium: Trivial pericardial effusion is present. Mitral Valve: The mitral valve is normal in structure. Moderate mitral valve regurgitation. Tricuspid Valve: The tricuspid valve is normal in structure. Tricuspid valve regurgitation is mild to moderate. Aortic Valve: The aortic valve is tricuspid. Aortic valve regurgitation is not visualized. Mild aortic valve sclerosis is present, with no evidence of aortic valve stenosis. Pulmonic Valve: The pulmonic valve was normal in structure. Pulmonic valve regurgitation is not visualized. Aorta: The aortic root is normal in size and structure. Venous: The inferior vena cava is dilated in size with greater than 50% respiratory variability, suggesting right atrial  pressure of 8 mmHg. IAS/Shunts: No atrial level shunt detected by color flow Doppler.  LEFT VENTRICLE PLAX 2D LVIDd:         6.30 cm LVIDs:         4.90 cm LV PW:         1.30 cm LV IVS:        1.00 cm LVOT diam:     1.90 cm LV SV:         42 LV SV Index:   26 LVOT Area:     2.84 cm  RIGHT VENTRICLE TAPSE (M-mode): 1.5 cm LEFT ATRIUM              Index        RIGHT ATRIUM           Index LA diam:        5.90 cm  3.60 cm/m   RA Area:     15.80 cm LA Vol (A2C):   182.0 ml 110.93 ml/m RA Volume:   36.10 ml  22.00 ml/m LA Vol (A4C):   213.0 ml 129.82 ml/m LA Biplane Vol: 197.0 ml 120.07 ml/m  AORTIC VALVE LVOT Vmax:   94.60 cm/s LVOT Vmean:  58.100 cm/s LVOT VTI:    0.149 m  AORTA Ao Root diam: 3.50 cm MITRAL VALVE                TRICUSPID VALVE MV Area (PHT): 6.65 cm     TR Peak grad:   45.4 mmHg MV Decel Time: 114 msec     TR Vmax:        337.00 cm/s MR Peak grad: 89.9 mmHg MR Mean grad: 47.0 mmHg     SHUNTS MR Vmax:      474.00 cm/s   Systemic VTI:  0.15 m MR Vmean:     317.0 cm/s    Systemic Diam: 1.90 cm MV E velocity: 127.00 cm/s Dorris Carnes MD Electronically signed by Dorris Carnes MD Signature Date/Time: 12/28/2020/1:49:19 AM    Final  CT HEAD CODE STROKE WO CONTRAST  Result Date: 01/03/2021 CLINICAL DATA:  Code stroke. Neuro deficit, acute, stroke suspected; aphasia and decreased right hand movement EXAM: CT HEAD WITHOUT CONTRAST TECHNIQUE: Contiguous axial images were obtained from the base of the skull through the vertex without intravenous contrast. COMPARISON:  04/06/2020 FINDINGS: Brain: There is no acute intracranial hemorrhage, mass effect, or edema. Patchy and confluent areas of low-attenuation in the supratentorial white matter are nonspecific but probably reflects similar moderate to advanced chronic microvascular ischemic changes. There are numerous small vessel infarcts involving the basal ganglia, thalamus, and adjacent white matter as well as the cerebellar hemispheres. Majority of these  appear to be present on the prior study. New age-indeterminate involvement of the lateral right thalamus. There is no extra-axial collection. Vascular: No hyperdense vessel. There is intracranial atherosclerotic calcification at the skull base. Skull: Unremarkable. Sinuses/Orbits: No acute abnormality. Other: Mastoid air cells are clear. ASPECTS ([place] Stroke Program Early CT Score) - Ganglionic level infarction (caudate, lentiform nuclei, internal capsule, insula, M1-M3 cortex): 7 - Supraganglionic infarction (M4-M6 cortex): 3 Total score (0-10 with 10 being normal): 10 IMPRESSION: There is no acute intracranial hemorrhage or evidence of acute infarction. ASPECT score is 10. These results were called by telephone at the time of interpretation on 01/03/2021 at 2:42 pm to provider [facility] , who verbally acknowledged these results. Electronically Signed   By: Macy Mis M.D.   On: 01/03/2021 14:42   CT ANGIO HEAD NECK W WO CM W PERF (CODE STROKE)  Result Date: 01/03/2021 CLINICAL DATA:  Neuro deficit, acute, stroke suspected; aphasia and decreased right hand motor EXAM: CT ANGIOGRAPHY HEAD AND NECK CT PERFUSION BRAIN TECHNIQUE: Multidetector CT imaging of the head and neck was performed using the standard protocol during bolus administration of intravenous contrast. Multiplanar CT image reconstructions and MIPs were obtained to evaluate the vascular anatomy. Carotid stenosis measurements (when applicable) are obtained utilizing NASCET criteria, using the distal internal carotid diameter as the denominator. Multiphase CT imaging of the brain was performed following IV bolus contrast injection. Subsequent parametric perfusion maps were calculated using RAPID software. CONTRAST:  159m OMNIPAQUE IOHEXOL 350 MG/ML SOLN COMPARISON:  None. FINDINGS: CTA NECK FINDINGS Aortic arch: Moderate calcified and noncalcified plaque arch patent great vessel origins. There is direct origin of the left artery arch. No  high-grade proximal subclavian stenosis. Right carotid system: Patent. Calcified plaque along the common carotid with minimal stenosis. Calcified plaque along proximal internal carotid causing less than 50% stenosis. Left carotid system: Patent. Calcified plaque along the common carotid without stenosis. Mixed plaque along the proximal internal carotid causing less than 50% stenosis. Vertebral arteries: Patent and codominant. Multifocal calcified plaque without high-grade stenosis. Skeleton: Multilevel degenerative changes of the included spine. Degenerative changes of the left temporomandibular joint. Other neck: Enlarged, heterogeneous left thyroid. Upper chest: No apical lung mass. Review of the MIP images confirms the above findings CTA HEAD FINDINGS Anterior circulation: Intracranial internal carotid arteries are patent with calcified plaque causing mild stenosis. Anterior and middle cerebral arteries are patent. Posterior circulation: Intracranial vertebral arteries are patent with mild plaque. Basilar artery is patent with fenestration proximally. Major cerebellar artery origins are patent. Posterior cerebral arteries are patent. Moderate stenosis at the left P1-P2 junction. Venous sinuses: As permitted by contrast timing, patent. Review of the MIP images confirms the above findings CT Brain Perfusion Findings: CBF (<30%) Volume: 08mPerfusion (Tmax>6.0s) volume: 1282mismatch Volume: 56m35mhis is within the inferior frontal lobes and  likely artifactual. IMPRESSION: No large vessel occlusion. Plaque at the ICA origins causes less than 50% stenosis. Moderate stenosis left P1-P2 PCA junction. Calculated territory at risk of 12 mL within the inferior frontal lobes is likely artifactual. Heterogeneous, enlarged left thyroid. Nonemergent ultrasound could be considered if not previously performed and in the absence of significant comorbidities or decreased life expectancy. Initial results were communicated to Dr.  Curly [place] at 3:07 pm on 01/03/2021 by text page via the [employer] messaging system. Electronically Signed   By: Macy Mis M.D.   On: 01/03/2021 15:17    Subjective:   Patient was seen and examined 01/04/2021, 11:03 AM Patient stable today. No acute distress.  No issues overnight Stable for discharge.  Patient was cleared for discharge yesterday but unfortunately patient had an episode of slurred speech, some motor dysfunction noted by daughter at bedside.  Code stroke was called  Patient had a complete stroke work-up including CT of the head, followed by MRI, neurology evaluation.  Stroke was ruled out, MRI negative for any acute findings Patient is already anticoagulated, on statins   Discussed with UM, patient seems to have poor insight, progressive dementia. Patient does follow commands, with global weakness, patient is deemed to be habitable before returning to the care of her daughter.  But long-term prognosis is poor with anticipation of decline.  This was discussed with the patient daughter in near future patient will need full 24-hour care versus nursing home.      Discharge Exam:    Vitals:   01/03/21 2053 01/04/21 0528 01/04/21 0723 01/04/21 0801  BP: (!) 153/88 (!) 186/100  (!) 160/112  Pulse: 88 87  (!) 104  Resp: 18     Temp: 98 F (36.7 C) 98.1 F (36.7 C)    TempSrc: Oral Oral    SpO2: 97% 100% 98% 94%  Weight:      Height:        General: Pt lying comfortably in bed & appears in no obvious distress. Cardiovascular: S1 & S2 heard, RRR, S1/S2 +. No murmurs, rubs, gallops or clicks. No JVD or pedal edema. Respiratory: Clear to auscultation without wheezing, rhonchi or crackles. No increased work of breathing. Abdominal:  Non-distended, non-tender & soft. No organomegaly or masses appreciated. Normal bowel sounds heard. CNS: Alert and oriented. No focal deficits. Extremities: no edema, no cyanosis      The results of significant diagnostics from this  hospitalization (including imaging, microbiology, ancillary and laboratory) are listed below for reference.      Microbiology:   Recent Results (from the past 240 hour(s))  Resp Panel by RT-PCR (Flu A&B, Covid) Nasopharyngeal Swab     Status: None   Collection Time: 12/31/20  3:50 AM   Specimen: Nasopharyngeal Swab; Nasopharyngeal(NP) swabs in vial transport medium  Result Value Ref Range Status   SARS Coronavirus 2 by RT PCR NEGATIVE NEGATIVE Final    Comment: (NOTE) SARS-CoV-2 target nucleic acids are NOT DETECTED.  The SARS-CoV-2 RNA is generally detectable in upper respiratory specimens during the acute phase of infection. The lowest concentration of SARS-CoV-2 viral copies this assay can detect is 138 copies/mL. A negative result does not preclude SARS-Cov-2 infection and should not be used as the sole basis for treatment or other patient management decisions. A negative result may occur with  improper specimen collection/handling, submission of specimen other than nasopharyngeal swab, presence of viral mutation(s) within the areas targeted by this assay, and inadequate number of viral copies(<138 copies/mL). A  negative result must be combined with clinical observations, patient history, and epidemiological information. The expected result is Negative.  Fact Sheet for Patients:  EntrepreneurPulse.com.au  Fact Sheet for Healthcare Providers:  IncredibleEmployment.be  This test is no t yet approved or cleared by the [place] FDA and  has been authorized for detection and/or diagnosis of SARS-CoV-2 by FDA under an Emergency Use Authorization (EUA). This EUA will remain  in effect (meaning this test can be used) for the duration of the COVID-19 declaration under Section 564(b)(1) of the Act, 21 U.S.C.section 360bbb-3(b)(1), unless the authorization is terminated  or revoked sooner.       Influenza A by PCR NEGATIVE NEGATIVE Final    Influenza B by PCR NEGATIVE NEGATIVE Final    Comment: (NOTE) The Xpert Xpress SARS-CoV-2/FLU/RSV plus assay is intended as an aid in the diagnosis of influenza from Nasopharyngeal swab specimens and should not be used as a sole basis for treatment. Nasal washings and aspirates are unacceptable for Xpert Xpress SARS-CoV-2/FLU/RSV testing.  Fact Sheet for Patients: EntrepreneurPulse.com.au  Fact Sheet for Healthcare Providers: IncredibleEmployment.be  This test is not yet approved or cleared by the [place] FDA and has been authorized for detection and/or diagnosis of SARS-CoV-2 by FDA under an Emergency Use Authorization (EUA). This EUA will remain in effect (meaning this test can be used) for the duration of the COVID-19 declaration under Section 564(b)(1) of the Act, 21 U.S.C. section 360bbb-3(b)(1), unless the authorization is terminated or revoked.  Performed at [facility], [address], [place], [place] [address]   SARS CORONAVIRUS 2 (TAT 6-24 HRS) Nasopharyngeal Nasopharyngeal Swab     Status: None   Collection Time: 01/02/21  6:00 PM   Specimen: Nasopharyngeal Swab  Result Value Ref Range Status   SARS Coronavirus 2 NEGATIVE NEGATIVE Final    Comment: (NOTE) SARS-CoV-2 target nucleic acids are NOT DETECTED.  The SARS-CoV-2 RNA is generally detectable in upper and lower respiratory specimens during the acute phase of infection. Negative results do not preclude SARS-CoV-2 infection, do not rule out co-infections with other pathogens, and should not be used as the sole basis for treatment or other patient management decisions. Negative results must be combined with clinical observations, patient history, and epidemiological information. The expected result is Negative.  Fact Sheet for Patients: SugarRoll.be  Fact Sheet for Healthcare Providers: https://www.woods-mathews.com/  This  test is not yet approved or cleared by the [place] FDA and  has been authorized for detection and/or diagnosis of SARS-CoV-2 by FDA under an Emergency Use Authorization (EUA). This EUA will remain  in effect (meaning this test can be used) for the duration of the COVID-19 declaration under Se ction 564(b)(1) of the Act, 21 U.S.C. section 360bbb-3(b)(1), unless the authorization is terminated or revoked sooner.  Performed at [facility], [place] [address], [place], [place] [address]      Labs:   CBC: Recent Labs  Lab 12/31/20 0346 01/01/21 0529  WBC 8.9 7.7  HGB 11.8* 10.7*  HCT 39.1 36.3  MCV 89.9 90.5  PLT 246 013   Basic Metabolic Panel: Recent Labs  Lab 12/31/20 0346 01/01/21 0529 01/02/21 0804 01/03/21 0423  NA 140 142 140 140  K 3.4* 3.9 3.3* 3.5  CL 107 105 102 105  CO2 '25 29 30 28  ' GLUCOSE 128* 92 109* 88  BUN 19 20 28* 26*  CREATININE 1.11* 1.10* 1.23* 1.20*  CALCIUM 8.8* 8.6* 8.6* 8.6*  MG  --  2.0  --   --  Liver Function Tests: Recent Labs  Lab 12/31/20 0346 01/01/21 0529  AST 33 28  ALT 35 30  ALKPHOS 74 64  BILITOT 0.6 0.8  PROT 7.1 5.9*  ALBUMIN 3.2* 2.8*   BNP (last 3 results) Recent Labs    11/23/20 1146 12/31/20 0346  BNP 788.0* 1,405.0*   Cardiac Enzymes:    Time coordinating discharge: Over 55 minutes  SIGNED: Deatra James, MD, FACP, [facility]. [facility],  Please use [email or web address] to Page If 7PM-7AM, please contact night-coverage [email or web address], Password [facility] 01/04/2021, 11:03 AM

## 2021-01-04 NOTE — Progress Notes (Signed)
Physical Therapy Treatment Patient Details Name: Rebecca Huerta MRN: [id number] DOB: 22-Sep-1945 Today's Date: 01/04/2021    History of Present Illness Rebecca Huerta  is a 75 y.o. female, with history of A. fib, anxiety, dementia, CHF, tobacco use disorder, and presents the ED with a chief complaint of dyspnea.  Patient has known history of CHF and recently had an echo 4 days ago.  She has not yet followed up with her cardiologist since the echo.  Daughter reports the patient has had increasing her peripheral edema over the last 5 or 6 days.  At home she is on 20 mg of Lasix 3 times weekly-MWF.  She has been compliant with this medication.  Last 2 days she has had an increase in her shortness of breath as well.  This evening the family noticed increased work of breathing, patient nodding off and then waking up gasping for air.  Decided was time to bring her into the hospital.  The ED provider reports that patient was very dyspneic speaking in only 3 or 4 word phrases upon arrival.  She had crackles on exam she was started on nitro, BiPAP, given Lasix and had symptom improvement.  Daughter reports the patient has been complaining of chest pain intermittently for about 24 hours.  Unfortunately patient cannot give details due to her dementia.  The daughter is unsure if the chest pain is exertional, but she knows that the dyspnea is worse with exertion.  Patient is able to ambulate without any assistive device at home.  She has had a decrease in her appetite that is chronic.  Last night for dinner she did have a high salt meal with hamburger and [place] fries.  Is a current smoker and and has a chronic cough.  She did choke on her dinner 2 days ago, and her cough seems to have increased since then.    PT Comments    Patient demonstrates slightly increased endurance/distance for ambulation with slow labored cadence requiring Min assist and occasional standing rest breaks, limited mostly due to fatigue and tolerated  sitting up in chair after therapy - RN notified.  Patient will benefit from continued physical therapy in hospital and recommended venue below to increase strength, balance, endurance for safe ADLs and gait.     Follow Up Recommendations  SNF     Equipment Recommendations  None recommended by PT    Recommendations for Other Services       Precautions / Restrictions Precautions Precautions: Fall Restrictions Weight Bearing Restrictions: No    Mobility  Bed Mobility Overal bed mobility: Needs Assistance Bed Mobility: Supine to Sit     Supine to sit: Supervision     General bed mobility comments: slightly increased time, labored movement    Transfers Overall transfer level: Needs assistance Equipment used: Rolling walker (2 wheeled);None Transfers: Sit to/from [employer] to Stand: Min assist;Min guard Stand pivot transfers: Min assist;Min guard       General transfer comment: using RW  Ambulation/Gait Ambulation/Gait assistance: Min assist Gait Distance (Feet): 35 Feet Assistive device: Rolling walker (2 wheeled) Gait Pattern/deviations: Decreased step length - right;Decreased step length - left;Decreased stride length;Trunk flexed Gait velocity: decreased   General Gait Details: increased endurance/distance for taking steps with slow labored cadence, frequent standing rest breaks, no loss of balance and limited mostly due to c/o fatigue   Stairs             Wheelchair Mobility    Modified Rankin (Stroke  Patients Only)       Balance Overall balance assessment: Needs assistance Sitting-balance support: Feet supported;No upper extremity supported Sitting balance-Leahy Scale: Good Sitting balance - Comments: seated at EOB   Standing balance support: During functional activity;No upper extremity supported Standing balance-Leahy Scale: Poor Standing balance comment: fair using RW                            Cognition  Arousal/Alertness: Awake/alert Behavior During Therapy: WFL for tasks assessed/performed Overall Cognitive Status: History of cognitive impairments - at baseline                                        Exercises      General Comments        Pertinent Vitals/Pain Pain Assessment: No/denies pain    Home Living Family/patient expects to be discharged to:: Private residence Living Arrangements: Alone Available Help at Discharge: Friend(s);Neighbor;Available PRN/intermittently;Family Type of Home: House Home Access: Stairs to enter Entrance Stairs-Rails: None Home Layout: Two level Home Equipment: Shower seat - built in;Walker - 2 wheels Additional Comments: History taken from document review due to pt being poor historian.    Prior Function Level of Independence: Needs assistance  Gait / Transfers Assistance Needed: household ambulator without AD ADL's / Homemaking Assistance Needed: assisted by family/friends     PT Goals (current goals can now be found in the care plan section) Acute Rehab PT Goals Patient Stated Goal: return home after rehab PT Goal Formulation: With patient/family Time For Goal Achievement: 01/10/21 Potential to Achieve Goals: Good Progress towards PT goals: Progressing toward goals    Frequency    Min 3X/week      PT Plan Current plan remains appropriate    Co-evaluation   Reason for Co-Treatment: To address functional/ADL transfers   OT goals addressed during session: ADL's and self-care;Strengthening/ROM      AM-PAC PT "6 Clicks" Mobility   Outcome Measure  Help needed turning from your back to your side while in a flat bed without using bedrails?: None Help needed moving from lying on your back to sitting on the side of a flat bed without using bedrails?: A Little Help needed moving to and from a bed to a chair (including a wheelchair)?: A Little Help needed standing up from a chair using your arms (e.g., wheelchair or  bedside chair)?: A Little Help needed to walk in hospital room?: A Little Help needed climbing 3-5 steps with a railing? : A Lot 6 Click Score: 18    End of Session Equipment Utilized During Treatment: Oxygen Activity Tolerance: Patient tolerated treatment well;Patient limited by fatigue Patient left: in chair;with call bell/phone within reach;with chair alarm set;with family/visitor present Nurse Communication: Mobility status PT Visit Diagnosis: Unsteadiness on feet (R26.81);Other abnormalities of gait and mobility (R26.89);Muscle weakness (generalized) (M62.81)     Time: 6160-7371 PT Time Calculation (min) (ACUTE ONLY): 20 min  Charges:  $Gait Training: 8-22 mins $Therapeutic Activity: 8-22 mins                     11:37 AM, 01/04/21 Ocie Bob, MPT Physical Therapist with [facility] [phone] office [phone] mobile phone

## 2021-01-05 LAB — RPR: RPR Ser Ql: NONREACTIVE

## 2021-01-05 NOTE — Discharge Summary (Signed)
Physician Discharge Summary [facility]    Patient: Rebecca Huerta                   Admit date: 12/31/2020   DOB: Mar 05, 1946             Discharge date:01/05/2021/9:38 AM [id number]                          PCP: Stephens Shire, MD  Disposition: SNF   Recommendations for Outpatient Follow-up:   Follow up: Cardiologist-3 weeks Follow PCP 3-6 weeks Continue current recommended medications including diuretics, newly added medication of Entresto by cardiologist Patient will need referral to psychiatrist, neurologist for confusion, memory issues  Discharge Condition: Stable   Code Status:   Code Status: DNR  Diet recommendation: Regular healthy diet   Patient was seen and examined this morning, stable no issues overnight.  Discussed plan of care and discharge with daughter in detail.  Patient cleared for discharge this AM. No further changes to this discharge summary.   Discharge Diagnoses:    Active Problems:   A-fib (HCC)   Tobacco abuse   Acute on chronic systolic CHF (congestive heart failure) (HCC)   Benign essential HTN   Hypokalemia   Pulmonary hypertension (HCC)   CHF exacerbation (HCC)   Acute CHF (congestive heart failure) (HCC)   Acute respiratory failure with hypoxia (HCC)   Aphasia determined by examination   Right arm weakness   Dementia with behavioral disturbance ([place])   History of Present Illness/ Hospital Course Kathleen Argue Summary:    Rebecca Huerta  is a 75 y.o. female, with history of A. fib, anxiety, dementia, CHF, tobacco use disorder, and presents the ED with a chief complaint of dyspnea.   Patient has known history of CHF and recently had an echo 4 days ago.   Daughter reports the patient has had increasing her peripheral edema over the last 5 or 6 days.  At home she is on 20 mg of Lasix 3 times weekly-MWF.  She has been compliant with this medication.  Last 2 days she has had an increase in her shortness of breath as well.  This evening  the family noticed increased work of breathing, patient nodding off and then waking up gasping for air.          Acute on chronic combined systolic-diastolic CHF. -Much improved responded well to current treatment during this hospitalization Back to baseline, off oxygen  -Last echo reviewed, EF 40% -Status post treatment with IV Lasix, will switch to p.o. today 01/02/2021 -Continue on Coreg - Entresto started 01/01/2021-tolerating well -Appreciate cardiology --- recommended continue diuretics, initiating Entresto 24/26 mg twice daily (Discontinued lisinopril)   Acute respiratory failure with hypoxia -Much improved, patient has been successfully weaned off supplemental oxygen, currently on room air, satting 98% -Due to CHF/COPD -Patient required BiPAP on admission -She has not been weaned down to nasal cannula -Continue to monitor -per Daughter not O2 dependent at home, Was on O2 up to 4 L, was subsequently weaned off   COPD -She has long history of tobacco use--refusing to quit -She has not been formally diagnosed with COPD, she has not had a PFTs -Continue inhalers -If she develops significant wheezing, will likely start IV steroids--we will taper down slowly -was  requiring 3 L of oxygen, currently satting 100% (goal to maintain 88-92%) Weaned off supplemental oxygen, currently on room air, satting 98%   Permanent atrial fibrillation -  Currently rate controlled, remained stable -Anticoagulated with Xarelto   Hypertension -Much improved, stable -Currently on Coreg -Discontinued lisinopril, -Entresto 24/26 mg p.o. twice daily 8 01/01/2021   Dementia -She is having periods worsening confusion, with mild agitation -Ativan discontinued, continue Xanax 0.25 mg 3 times daily as needed -Seroquel 50 mg p.o. nightly initiated 01/02/2021   Goals of care -Discussed with patient's daughter who is her POA at bedside -She admits that patient would want to be DNR   Severe generalized  global weakness PT/OT evaluation >>> recommend SNF family agreeable - daughter is agreeable with the plan -Social worker consulted    Code stroke: 01/03/2021 due to slurred speech, fine motor deficit On-call neurologist consulted: Negative for new focal neurological findings, stroke was ruled out Stat CT of the head negative MRI and EEG--- results were reviewed, negative for any acute findings, no signs of seizures or acute stroke   Patient is already on Xarelto anticoagulated   Discussed with the patient daughter at bedside... Expectations were met, confirmed DNR/DNI status    ----------------------------------------------------------------------------------------------------------------------------------------------  Consultants: Cardiology/telemetry neurology     ------------------------------------------------------------------------------------------------------------------------------------------------  Code Status:   Code Status: DNR   Family Communication: Discussed with daughter at bedside in detail The above findings and plan of care has been discussed with patient (and family)  in detail, they expressed understanding and agreement of above. -Advance care planning has been discussed.     Dispo: The patient is from: Home              Anticipated d/c is to SNF     Discharge Instructions:   Discharge Instructions     Activity as tolerated - No restrictions   Complete by: As directed    Call MD for:  difficulty breathing, headache or visual disturbances   Complete by: As directed    Call MD for:  extreme fatigue   Complete by: As directed    Call MD for:  hives   Complete by: As directed    Call MD for:  persistant dizziness or light-headedness   Complete by: As directed    Call MD for:  persistant nausea and vomiting   Complete by: As directed    Call MD for:  severe uncontrolled pain   Complete by: As directed    Call MD for:  temperature >100.4    Complete by: As directed    Diet - low sodium heart healthy   Complete by: As directed    Discharge instructions   Complete by: As directed    Continue with current recommended medication, recommending daily weight Follow-up with PCP in 1 week Follow-up with cardiology 2-4 weeks   Increase activity slowly   Complete by: As directed         Medication List     STOP taking these medications    folic acid 1 MG tablet Commonly known as: FOLVITE   lisinopril 20 MG tablet Commonly known as: ZESTRIL   thiamine 100 MG tablet       TAKE these medications    acetaminophen 325 MG tablet Commonly known as: TYLENOL Take 2 tablets (650 mg total) by mouth every 6 (six) hours.   ALPRAZolam 0.25 MG tablet Commonly known as: XANAX Take 1 tablet (0.25 mg total) by mouth 3 (three) times daily as needed for anxiety.   budesonide 0.25 MG/2ML nebulizer solution Commonly known as: PULMICORT Take 2 mLs (0.25 mg total) by nebulization 2 (two) times daily.   carvedilol 25 MG tablet  Commonly known as: COREG Take 1 tablet (25 mg total) by mouth 2 (two) times daily with a meal. What changed:  medication strength how much to take   Fish Oil 1000 MG Caps Take 1 capsule by mouth daily.   furosemide 40 MG tablet Commonly known as: LASIX Take 1 tablet (40 mg total) by mouth daily. What changed:  medication strength how much to take how to take this when to take this additional instructions   guaiFENesin 600 MG 12 hr tablet Commonly known as: MUCINEX Take 2 tablets (1,200 mg total) by mouth 2 (two) times daily.   ipratropium-albuterol 0.5-2.5 (3) MG/3ML Soln Commonly known as: DUONEB Take 3 mLs by nebulization every 6 (six) hours.   memantine 10 MG tablet Commonly known as: NAMENDA Take 1 tablet (10 mg total) by mouth daily.   multivitamin tablet Take 1 tablet by mouth daily.   nicotine 21 mg/24hr patch Commonly known as: NICODERM CQ - dosed in mg/24 hours Place 1 patch  (21 mg total) onto the skin daily.   polyethylene glycol 17 g packet Commonly known as: MIRALAX / GLYCOLAX Take 17 g by mouth daily as needed for mild constipation.   QUEtiapine 50 MG tablet Commonly known as: SEROQUEL Take 1 tablet (50 mg total) by mouth at bedtime.   Rivaroxaban 15 MG Tabs tablet Commonly known as: XARELTO Take 1 tablet (15 mg total) by mouth daily with supper.   sacubitril-valsartan 24-26 MG Commonly known as: ENTRESTO Take 1 tablet by mouth 2 (two) times daily.        Contact information for after-discharge care     [facility] .   Service: Skilled Nursing Contact information: [place] [address] [phone]                    Allergies  Allergen Reactions   Penicillins Rash     Procedures /Studies:   MR BRAIN WO CONTRAST  Result Date: 01/03/2021 CLINICAL DATA:  Neurological deficit, acute, stroke suspected. Weakness over the last 2 days. EXAM: MRI HEAD WITHOUT CONTRAST TECHNIQUE: Multiplanar, multiecho pulse sequences of the brain and surrounding structures were obtained without intravenous contrast. COMPARISON:  CT study earlier same day. FINDINGS: Brain: Diffusion imaging does not show any acute or subacute infarction. There is considerable artifact in the region of the medulla, lower pons and inferior cerebellum. There are chronic small-vessel ischemic changes of the pons. Numerous old small vessel infarctions within the inferior cerebellum. Cerebral hemispheres show chronic small-vessel ischemic changes of the thalami, basal ganglia and hemispheric white matter. No large vessel territory infarction. No mass lesion, hemorrhage, hydrocephalus or extra-axial collection. Vascular: Major vessels at the base of the brain show flow. Skull and upper cervical spine: Negative Sinuses/Orbits: Clear/normal Other: None IMPRESSION: No acute or subacute finding. Numerous old  small vessel infarctions within the inferior cerebellum. Chronic small-vessel ischemic changes of the pons, thalami, basal ganglia and cerebral hemispheric white matter. Because artifact on diffusion imaging in the lower portion of the scan, presumably from dental work, a small acute infarction in the inferior cerebellum might be inapparent, but I have no specific basis upon which to suggest that. Electronically Signed   By: Nelson Chimes M.D.   On: 01/03/2021 16:12   DG Chest Port 1 View  Result Date: 12/31/2020 CLINICAL DATA:  Dyspnea EXAM: PORTABLE CHEST 1 VIEW COMPARISON:  11/23/2020, 04/06/2020 FINDINGS: The lungs are symmetrically well expanded. Chronic  underlying parenchymal interstitial changes are again identified, however, there are superimposed interstitial infiltrates now present likely representing mild superimposed interstitial pulmonary edema. The cardiac size is mildly enlarged, similar to prior examination, but progressive since 04/06/2020. No pneumothorax or pleural effusion. No acute bone abnormality. IMPRESSION: Mild cardiogenic failure superimposed upon underlying mild chronic interstitial lung disease. Electronically Signed   By: Fidela Salisbury MD   On: 12/31/2020 04:05   EEG adult  Result Date: 01/03/2021 Lora Havens, MD     01/03/2021  6:03 PM Patient Name: Joelly Bolanos MRN: [id number] Epilepsy Attending: Lora Havens Referring Physician/Provider: Dr Lesleigh Noe Date: 01/03/2021 Duration: 23.16 mins Patient history: 75yo F with sudden onset of right upper extremity weakness and aphasia. Eeg to evaluate for seizure Level of alertness: Awake, asleep AEDs during EEG study: None Technical aspects: This EEG study was done with scalp electrodes positioned according to the 10-20 International system of electrode placement. Electrical activity was acquired at a sampling rate of '500Hz'  and reviewed with a high frequency filter of '70Hz'  and a low frequency filter of '1Hz' . EEG data were  recorded continuously and digitally stored. Description: The posterior dominant rhythm consists of 8 Hz activity of moderate voltage (25-35 uV) seen predominantly in posterior head regions, symmetric and reactive to eye opening and eye closing. Sleep was characterized by vertex waves, sleep spindles (12 to 14 Hz), maximal frontocentral region. EEG showed continuous generalized polymorphic mixed frequencies with predominantly 5 to 8 Hz theta as well as intermittent 2-'3hz'  generalized delta slowing.  Hyperventilation and photic stimulation were not performed.   ABNORMALITY - Continuous slow, generalized IMPRESSION: This study is suggestive of mild to moderate diffuse encephalopathy, nonspecific etiology. No seizures or epileptiform discharges were seen throughout the recording. Lora Havens   ECHOCARDIOGRAM COMPLETE  Result Date: 12/28/2020    ECHOCARDIOGRAM REPORT   Patient Name:   Laray Skyles Date of Exam: 12/27/2020 Medical Rec #:  [id number]   Height:       64.0 in Accession #:    [id number]  Weight:       132.2 lb Date of Birth:  1946-06-05   BSA:          1.641 m Patient Age:    25 years    BP:           160/110 mmHg Patient Gender: F           HR:           65 bpm. Exam Location:  [facility] Procedure: 2D Echo, Cardiac Doppler and Color Doppler Indications:    R06.02 (ICD-10-CM) - SOB (shortness of breath)  History:        Patient has prior history of Echocardiogram examinations, most                 recent 04/20/2015. CHF and Cardiomyopathy, COPD,                 Arrythmias:Atrial Fibrillation; Risk Factors:Former Smoker and                 Hypertension. ETOH.  Sonographer:    Alvino Chapel RCS Referring Phys: [id number] [place]  1. The apical viiews are somehat foreshortend There is diffuse hypokinesis, worse in the basal inferior and inferoseptal walls . Left ventricular ejection fraction, by estimation, is 40%%. The left ventricle demonstrates global hypokinesis. The left ventricular  internal cavity size was severely dilated. There is mild left ventricular hypertrophy. Left ventricular  diastolic parameters are indeterminate.  2. Right ventricular systolic function is normal. The right ventricular size is normal. There is moderately elevated pulmonary artery systolic pressure.  3. Left atrial size was severely dilated.  4. The mitral valve is normal in structure. Moderate mitral valve regurgitation.  5. Tricuspid valve regurgitation is mild to moderate.  6. The aortic valve is tricuspid. Aortic valve regurgitation is not visualized. Mild aortic valve sclerosis is present, with no evidence of aortic valve stenosis.  7. The inferior vena cava is dilated in size with >50% respiratory variability, suggesting right atrial pressure of 8 mmHg. Comparison(s): No prior Echocardiogram. FINDINGS  Left Ventricle: The apical viiews are somehat foreshortend There is diffuse hypokinesis, worse in the basal inferior and inferoseptal walls. Left ventricular ejection fraction, by estimation, is 40%%. The left ventricle demonstrates global hypokinesis. The left ventricular internal cavity size was severely dilated. There is mild left ventricular hypertrophy. Left ventricular diastolic parameters are indeterminate. Right Ventricle: The right ventricular size is normal. Right vetricular wall thickness was not assessed. Right ventricular systolic function is normal. There is moderately elevated pulmonary artery systolic pressure. The tricuspid regurgitant velocity is  3.37 m/s, and with an assumed right atrial pressure of 3 mmHg, the estimated right ventricular systolic pressure is 22.2 mmHg. Left Atrium: Left atrial size was severely dilated. Right Atrium: Right atrial size was normal in size. Pericardium: Trivial pericardial effusion is present. Mitral Valve: The mitral valve is normal in structure. Moderate mitral valve regurgitation. Tricuspid Valve: The tricuspid valve is normal in structure. Tricuspid valve  regurgitation is mild to moderate. Aortic Valve: The aortic valve is tricuspid. Aortic valve regurgitation is not visualized. Mild aortic valve sclerosis is present, with no evidence of aortic valve stenosis. Pulmonic Valve: The pulmonic valve was normal in structure. Pulmonic valve regurgitation is not visualized. Aorta: The aortic root is normal in size and structure. Venous: The inferior vena cava is dilated in size with greater than 50% respiratory variability, suggesting right atrial pressure of 8 mmHg. IAS/Shunts: No atrial level shunt detected by color flow Doppler.  LEFT VENTRICLE PLAX 2D LVIDd:         6.30 cm LVIDs:         4.90 cm LV PW:         1.30 cm LV IVS:        1.00 cm LVOT diam:     1.90 cm LV SV:         42 LV SV Index:   26 LVOT Area:     2.84 cm  RIGHT VENTRICLE TAPSE (M-mode): 1.5 cm LEFT ATRIUM              Index        RIGHT ATRIUM           Index LA diam:        5.90 cm  3.60 cm/m   RA Area:     15.80 cm LA Vol (A2C):   182.0 ml 110.93 ml/m RA Volume:   36.10 ml  22.00 ml/m LA Vol (A4C):   213.0 ml 129.82 ml/m LA Biplane Vol: 197.0 ml 120.07 ml/m  AORTIC VALVE LVOT Vmax:   94.60 cm/s LVOT Vmean:  58.100 cm/s LVOT VTI:    0.149 m  AORTA Ao Root diam: 3.50 cm MITRAL VALVE                TRICUSPID VALVE MV Area (PHT): 6.65 cm     TR Peak grad:  45.4 mmHg MV Decel Time: 114 msec     TR Vmax:        337.00 cm/s MR Peak grad: 89.9 mmHg MR Mean grad: 47.0 mmHg     SHUNTS MR Vmax:      474.00 cm/s   Systemic VTI:  0.15 m MR Vmean:     317.0 cm/s    Systemic Diam: 1.90 cm MV E velocity: 127.00 cm/s Dorris Carnes MD Electronically signed by Dorris Carnes MD Signature Date/Time: 12/28/2020/1:49:19 AM    Final    CT HEAD CODE STROKE WO CONTRAST  Result Date: 01/03/2021 CLINICAL DATA:  Code stroke. Neuro deficit, acute, stroke suspected; aphasia and decreased right hand movement EXAM: CT HEAD WITHOUT CONTRAST TECHNIQUE: Contiguous axial images were obtained from the base of the skull through the  vertex without intravenous contrast. COMPARISON:  04/06/2020 FINDINGS: Brain: There is no acute intracranial hemorrhage, mass effect, or edema. Patchy and confluent areas of low-attenuation in the supratentorial white matter are nonspecific but probably reflects similar moderate to advanced chronic microvascular ischemic changes. There are numerous small vessel infarcts involving the basal ganglia, thalamus, and adjacent white matter as well as the cerebellar hemispheres. Majority of these appear to be present on the prior study. New age-indeterminate involvement of the lateral right thalamus. There is no extra-axial collection. Vascular: No hyperdense vessel. There is intracranial atherosclerotic calcification at the skull base. Skull: Unremarkable. Sinuses/Orbits: No acute abnormality. Other: Mastoid air cells are clear. ASPECTS ([place] Stroke Program Early CT Score) - Ganglionic level infarction (caudate, lentiform nuclei, internal capsule, insula, M1-M3 cortex): 7 - Supraganglionic infarction (M4-M6 cortex): 3 Total score (0-10 with 10 being normal): 10 IMPRESSION: There is no acute intracranial hemorrhage or evidence of acute infarction. ASPECT score is 10. These results were called by telephone at the time of interpretation on 01/03/2021 at 2:42 pm to provider [facility] , who verbally acknowledged these results. Electronically Signed   By: Macy Mis M.D.   On: 01/03/2021 14:42   CT ANGIO HEAD NECK W WO CM W PERF (CODE STROKE)  Result Date: 01/03/2021 CLINICAL DATA:  Neuro deficit, acute, stroke suspected; aphasia and decreased right hand motor EXAM: CT ANGIOGRAPHY HEAD AND NECK CT PERFUSION BRAIN TECHNIQUE: Multidetector CT imaging of the head and neck was performed using the standard protocol during bolus administration of intravenous contrast. Multiplanar CT image reconstructions and MIPs were obtained to evaluate the vascular anatomy. Carotid stenosis measurements (when applicable) are  obtained utilizing NASCET criteria, using the distal internal carotid diameter as the denominator. Multiphase CT imaging of the brain was performed following IV bolus contrast injection. Subsequent parametric perfusion maps were calculated using RAPID software. CONTRAST:  113m OMNIPAQUE IOHEXOL 350 MG/ML SOLN COMPARISON:  None. FINDINGS: CTA NECK FINDINGS Aortic arch: Moderate calcified and noncalcified plaque arch patent great vessel origins. There is direct origin of the left artery arch. No high-grade proximal subclavian stenosis. Right carotid system: Patent. Calcified plaque along the common carotid with minimal stenosis. Calcified plaque along proximal internal carotid causing less than 50% stenosis. Left carotid system: Patent. Calcified plaque along the common carotid without stenosis. Mixed plaque along the proximal internal carotid causing less than 50% stenosis. Vertebral arteries: Patent and codominant. Multifocal calcified plaque without high-grade stenosis. Skeleton: Multilevel degenerative changes of the included spine. Degenerative changes of the left temporomandibular joint. Other neck: Enlarged, heterogeneous left thyroid. Upper chest: No apical lung mass. Review of the MIP images confirms the above findings CTA HEAD FINDINGS Anterior circulation: Intracranial  internal carotid arteries are patent with calcified plaque causing mild stenosis. Anterior and middle cerebral arteries are patent. Posterior circulation: Intracranial vertebral arteries are patent with mild plaque. Basilar artery is patent with fenestration proximally. Major cerebellar artery origins are patent. Posterior cerebral arteries are patent. Moderate stenosis at the left P1-P2 junction. Venous sinuses: As permitted by contrast timing, patent. Review of the MIP images confirms the above findings CT Brain Perfusion Findings: CBF (<30%) Volume: 41m Perfusion (Tmax>6.0s) volume: 151mMismatch Volume: 1289mThis is within the inferior  frontal lobes and likely artifactual. IMPRESSION: No large vessel occlusion. Plaque at the ICA origins causes less than 50% stenosis. Moderate stenosis left P1-P2 PCA junction. Calculated territory at risk of 12 mL within the inferior frontal lobes is likely artifactual. Heterogeneous, enlarged left thyroid. Nonemergent ultrasound could be considered if not previously performed and in the absence of significant comorbidities or decreased life expectancy. Initial results were communicated to Dr. BhaCurly [place]ores 3:07 pm on 01/03/2021 by text page via the [facility] system. Electronically Signed   By: PraMacy MisD.   On: 01/03/2021 15:17    Subjective:   Patient was seen and examined 01/05/2021, 9:38 AM Patient stable today. No acute distress.  No issues overnight Stable for discharge.  Discharge Exam:    Vitals:   01/04/21 1953 01/04/21 2016 01/05/21 0500 01/05/21 0808  BP:  118/85    Pulse:  78    Resp:  19    Temp:  98 F (36.7 C)    TempSrc:  Oral    SpO2: 96% 92%  94%  Weight:   57.2 kg   Height:        General: Pt lying comfortably in bed & appears in no obvious distress. Cardiovascular: S1 & S2 heard, RRR, S1/S2 +. No murmurs, rubs, gallops or clicks. No JVD or pedal edema. Respiratory: Clear to auscultation without wheezing, rhonchi or crackles. No increased work of breathing. Abdominal:  Non-distended, non-tender & soft. No organomegaly or masses appreciated. Normal bowel sounds heard. CNS: Alert and oriented. No focal deficits. Extremities: no edema, no cyanosis      The results of significant diagnostics from this hospitalization (including imaging, microbiology, ancillary and laboratory) are listed below for reference.      Microbiology:   Recent Results (from the past 240 hour(s))  Resp Panel by RT-PCR (Flu A&B, Covid) Nasopharyngeal Swab     Status: None   Collection Time: 12/31/20  3:50 AM   Specimen: Nasopharyngeal Swab; Nasopharyngeal(NP) swabs in vial  transport medium  Result Value Ref Range Status   SARS Coronavirus 2 by RT PCR NEGATIVE NEGATIVE Final    Comment: (NOTE) SARS-CoV-2 target nucleic acids are NOT DETECTED.  The SARS-CoV-2 RNA is generally detectable in upper respiratory specimens during the acute phase of infection. The lowest concentration of SARS-CoV-2 viral copies this assay can detect is 138 copies/mL. A negative result does not preclude SARS-Cov-2 infection and should not be used as the sole basis for treatment or other patient management decisions. A negative result may occur with  improper specimen collection/handling, submission of specimen other than nasopharyngeal swab, presence of viral mutation(s) within the areas targeted by this assay, and inadequate number of viral copies(<138 copies/mL). A negative result must be combined with clinical observations, patient history, and epidemiological information. The expected result is Negative.  Fact Sheet for Patients:  httEntrepreneurPulse.com.auact Sheet for Healthcare Providers:  httIncredibleEmployment.behis test is no t yet approved or cleared by  the [employer] and  has been authorized for detection and/or diagnosis of SARS-CoV-2 by FDA under an Emergency Use Authorization (EUA). This EUA will remain  in effect (meaning this test can be used) for the duration of the COVID-19 declaration under Section 564(b)(1) of the Act, 21 U.S.C.section 360bbb-3(b)(1), unless the authorization is terminated  or revoked sooner.       Influenza A by PCR NEGATIVE NEGATIVE Final   Influenza B by PCR NEGATIVE NEGATIVE Final    Comment: (NOTE) The Xpert Xpress SARS-CoV-2/FLU/RSV plus assay is intended as an aid in the diagnosis of influenza from Nasopharyngeal swab specimens and should not be used as a sole basis for treatment. Nasal washings and aspirates are unacceptable for Xpert Xpress SARS-CoV-2/FLU/RSV testing.  Fact  Sheet for Patients: EntrepreneurPulse.com.au  Fact Sheet for Healthcare Providers: IncredibleEmployment.be  This test is not yet approved or cleared by the [place] FDA and has been authorized for detection and/or diagnosis of SARS-CoV-2 by FDA under an Emergency Use Authorization (EUA). This EUA will remain in effect (meaning this test can be used) for the duration of the COVID-19 declaration under Section 564(b)(1) of the Act, 21 U.S.C. section 360bbb-3(b)(1), unless the authorization is terminated or revoked.  Performed at [facility], [address] [place], [place] [address]   SARS CORONAVIRUS 2 (TAT 6-24 HRS) Nasopharyngeal Nasopharyngeal Swab     Status: None   Collection Time: 01/02/21  6:00 PM   Specimen: Nasopharyngeal Swab  Result Value Ref Range Status   SARS Coronavirus 2 NEGATIVE NEGATIVE Final    Comment: (NOTE) SARS-CoV-2 target nucleic acids are NOT DETECTED.  The SARS-CoV-2 RNA is generally detectable in upper and lower respiratory specimens during the acute phase of infection. Negative results do not preclude SARS-CoV-2 infection, do not rule out co-infections with other pathogens, and should not be used as the sole basis for treatment or other patient management decisions. Negative results must be combined with clinical observations, patient history, and epidemiological information. The expected result is Negative.  Fact Sheet for Patients: SugarRoll.be  Fact Sheet for Healthcare Providers: https://www.woods-mathews.com/  This test is not yet approved or cleared by the [place] FDA and  has been authorized for detection and/or diagnosis of SARS-CoV-2 by FDA under an Emergency Use Authorization (EUA). This EUA will remain  in effect (meaning this test can be used) for the duration of the COVID-19 declaration under Se ction 564(b)(1) of the Act, 21 U.S.C. section  360bbb-3(b)(1), unless the authorization is terminated or revoked sooner.  Performed at [facility], [place] [address], [place], [place] [address]      Labs:   CBC: Recent Labs  Lab 12/31/20 0346 01/01/21 0529  WBC 8.9 7.7  HGB 11.8* 10.7*  HCT 39.1 36.3  MCV 89.9 90.5  PLT 246 517   Basic Metabolic Panel: Recent Labs  Lab 12/31/20 0346 01/01/21 0529 01/02/21 0804 01/03/21 0423  NA 140 142 140 140  K 3.4* 3.9 3.3* 3.5  CL 107 105 102 105  CO2 '25 29 30 28  ' GLUCOSE 128* 92 109* 88  BUN 19 20 28* 26*  CREATININE 1.11* 1.10* 1.23* 1.20*  CALCIUM 8.8* 8.6* 8.6* 8.6*  MG  --  2.0  --   --    Liver Function Tests: Recent Labs  Lab 12/31/20 0346 01/01/21 0529  AST 33 28  ALT 35 30  ALKPHOS 74 64  BILITOT 0.6 0.8  PROT 7.1 5.9*  ALBUMIN 3.2* 2.8*  BNP (last 3 results) Recent Labs    11/23/20 1146 12/31/20 0346  BNP 788.0* 1,405.0*   Cardiac Enzymes:    Time coordinating discharge: Over 55 minutes  SIGNED: Deatra James, MD, FACP, [facility]. [facility],  Please use [email or web address] to Page If 7PM-7AM, please contact night-coverage [email or web address] 01/05/2021, 9:38 AM

## 2021-01-06 DIAGNOSIS — F0390 Unspecified dementia without behavioral disturbance: Secondary | ICD-10-CM | POA: Diagnosis not present

## 2021-01-06 DIAGNOSIS — Z720 Tobacco use: Secondary | ICD-10-CM | POA: Diagnosis not present

## 2021-01-06 DIAGNOSIS — I5043 Acute on chronic combined systolic (congestive) and diastolic (congestive) heart failure: Secondary | ICD-10-CM | POA: Diagnosis not present

## 2021-01-06 DIAGNOSIS — I4891 Unspecified atrial fibrillation: Secondary | ICD-10-CM | POA: Diagnosis not present

## 2021-01-06 DIAGNOSIS — J449 Chronic obstructive pulmonary disease, unspecified: Secondary | ICD-10-CM | POA: Diagnosis not present

## 2021-01-06 DIAGNOSIS — I10 Essential (primary) hypertension: Secondary | ICD-10-CM | POA: Diagnosis not present

## 2021-01-07 LAB — METHYLMALONIC ACID, SERUM: Methylmalonic Acid, Quantitative: 417 nmol/L — ABNORMAL HIGH (ref 0–378)

## 2021-01-10 ENCOUNTER — Ambulatory Visit: Payer: PPO | Admitting: Physician Assistant

## 2021-01-10 DIAGNOSIS — T68XXXD Hypothermia, subsequent encounter: Secondary | ICD-10-CM | POA: Diagnosis not present

## 2021-01-10 DIAGNOSIS — Z880 Allergy status to penicillin: Secondary | ICD-10-CM | POA: Diagnosis not present

## 2021-01-10 DIAGNOSIS — Z79899 Other long term (current) drug therapy: Secondary | ICD-10-CM | POA: Diagnosis not present

## 2021-01-10 DIAGNOSIS — I5043 Acute on chronic combined systolic (congestive) and diastolic (congestive) heart failure: Secondary | ICD-10-CM | POA: Diagnosis not present

## 2021-01-10 DIAGNOSIS — E162 Hypoglycemia, unspecified: Secondary | ICD-10-CM | POA: Diagnosis not present

## 2021-01-10 DIAGNOSIS — N179 Acute kidney failure, unspecified: Secondary | ICD-10-CM | POA: Diagnosis not present

## 2021-01-10 DIAGNOSIS — R0602 Shortness of breath: Secondary | ICD-10-CM | POA: Diagnosis not present

## 2021-01-10 DIAGNOSIS — J449 Chronic obstructive pulmonary disease, unspecified: Secondary | ICD-10-CM | POA: Diagnosis not present

## 2021-01-10 DIAGNOSIS — Z87891 Personal history of nicotine dependence: Secondary | ICD-10-CM | POA: Diagnosis not present

## 2021-01-10 DIAGNOSIS — E876 Hypokalemia: Secondary | ICD-10-CM | POA: Diagnosis not present

## 2021-01-10 DIAGNOSIS — I517 Cardiomegaly: Secondary | ICD-10-CM | POA: Diagnosis not present

## 2021-01-10 DIAGNOSIS — T68XXXA Hypothermia, initial encounter: Secondary | ICD-10-CM | POA: Diagnosis not present

## 2021-01-10 DIAGNOSIS — R6520 Severe sepsis without septic shock: Secondary | ICD-10-CM | POA: Diagnosis not present

## 2021-01-10 DIAGNOSIS — E1122 Type 2 diabetes mellitus with diabetic chronic kidney disease: Secondary | ICD-10-CM | POA: Diagnosis not present

## 2021-01-10 DIAGNOSIS — I4821 Permanent atrial fibrillation: Secondary | ICD-10-CM | POA: Diagnosis not present

## 2021-01-10 DIAGNOSIS — I5022 Chronic systolic (congestive) heart failure: Secondary | ICD-10-CM | POA: Diagnosis not present

## 2021-01-10 DIAGNOSIS — R4701 Aphasia: Secondary | ICD-10-CM | POA: Diagnosis not present

## 2021-01-10 DIAGNOSIS — R2689 Other abnormalities of gait and mobility: Secondary | ICD-10-CM | POA: Diagnosis not present

## 2021-01-10 DIAGNOSIS — Z7951 Long term (current) use of inhaled steroids: Secondary | ICD-10-CM | POA: Diagnosis not present

## 2021-01-10 DIAGNOSIS — R279 Unspecified lack of coordination: Secondary | ICD-10-CM | POA: Diagnosis not present

## 2021-01-10 DIAGNOSIS — A419 Sepsis, unspecified organism: Secondary | ICD-10-CM | POA: Diagnosis not present

## 2021-01-10 DIAGNOSIS — Z743 Need for continuous supervision: Secondary | ICD-10-CM | POA: Diagnosis not present

## 2021-01-10 DIAGNOSIS — R41841 Cognitive communication deficit: Secondary | ICD-10-CM | POA: Diagnosis not present

## 2021-01-10 DIAGNOSIS — U071 COVID-19: Secondary | ICD-10-CM | POA: Diagnosis not present

## 2021-01-10 DIAGNOSIS — Z66 Do not resuscitate: Secondary | ICD-10-CM | POA: Diagnosis not present

## 2021-01-10 DIAGNOSIS — Z7901 Long term (current) use of anticoagulants: Secondary | ICD-10-CM | POA: Diagnosis not present

## 2021-01-10 DIAGNOSIS — R4182 Altered mental status, unspecified: Secondary | ICD-10-CM | POA: Diagnosis not present

## 2021-01-10 DIAGNOSIS — F0390 Unspecified dementia without behavioral disturbance: Secondary | ICD-10-CM | POA: Diagnosis not present

## 2021-01-10 DIAGNOSIS — F419 Anxiety disorder, unspecified: Secondary | ICD-10-CM | POA: Diagnosis not present

## 2021-01-10 DIAGNOSIS — M6281 Muscle weakness (generalized): Secondary | ICD-10-CM | POA: Diagnosis not present

## 2021-01-10 DIAGNOSIS — I10 Essential (primary) hypertension: Secondary | ICD-10-CM | POA: Diagnosis not present

## 2021-01-10 DIAGNOSIS — G9341 Metabolic encephalopathy: Secondary | ICD-10-CM | POA: Diagnosis not present

## 2021-01-10 DIAGNOSIS — N1832 Chronic kidney disease, stage 3b: Secondary | ICD-10-CM | POA: Diagnosis not present

## 2021-01-10 DIAGNOSIS — A4151 Sepsis due to Escherichia coli [E. coli]: Secondary | ICD-10-CM | POA: Diagnosis not present

## 2021-01-10 DIAGNOSIS — I272 Pulmonary hypertension, unspecified: Secondary | ICD-10-CM | POA: Diagnosis not present

## 2021-01-10 DIAGNOSIS — I509 Heart failure, unspecified: Secondary | ICD-10-CM | POA: Diagnosis not present

## 2021-01-10 DIAGNOSIS — I130 Hypertensive heart and chronic kidney disease with heart failure and stage 1 through stage 4 chronic kidney disease, or unspecified chronic kidney disease: Secondary | ICD-10-CM | POA: Diagnosis not present

## 2021-01-10 DIAGNOSIS — J9601 Acute respiratory failure with hypoxia: Secondary | ICD-10-CM | POA: Diagnosis not present

## 2021-01-10 DIAGNOSIS — N390 Urinary tract infection, site not specified: Secondary | ICD-10-CM | POA: Diagnosis not present

## 2021-01-10 DIAGNOSIS — R5381 Other malaise: Secondary | ICD-10-CM | POA: Diagnosis not present

## 2021-01-10 DIAGNOSIS — F0391 Unspecified dementia with behavioral disturbance: Secondary | ICD-10-CM | POA: Diagnosis not present

## 2021-01-10 DIAGNOSIS — Z111 Encounter for screening for respiratory tuberculosis: Secondary | ICD-10-CM | POA: Diagnosis not present

## 2021-01-11 DIAGNOSIS — I509 Heart failure, unspecified: Secondary | ICD-10-CM | POA: Diagnosis not present

## 2021-01-11 DIAGNOSIS — R5381 Other malaise: Secondary | ICD-10-CM | POA: Diagnosis not present

## 2021-01-11 DIAGNOSIS — Z111 Encounter for screening for respiratory tuberculosis: Secondary | ICD-10-CM | POA: Diagnosis not present

## 2021-01-12 ENCOUNTER — Emergency Department (HOSPITAL_COMMUNITY): Payer: PPO

## 2021-01-12 ENCOUNTER — Inpatient Hospital Stay (HOSPITAL_COMMUNITY)
Admission: EM | Admit: 2021-01-12 | Discharge: 2021-01-18 | DRG: 871 | Disposition: A | Discharge status: Home Health | Payer: PPO | Attending: Internal Medicine | Admitting: Internal Medicine

## 2021-01-12 ENCOUNTER — Other Ambulatory Visit: Payer: Self-pay

## 2021-01-12 DIAGNOSIS — N179 Acute kidney failure, unspecified: Secondary | ICD-10-CM | POA: Diagnosis not present

## 2021-01-12 DIAGNOSIS — T68XXXA Hypothermia, initial encounter: Secondary | ICD-10-CM | POA: Diagnosis not present

## 2021-01-12 DIAGNOSIS — G9341 Metabolic encephalopathy: Secondary | ICD-10-CM | POA: Diagnosis present

## 2021-01-12 DIAGNOSIS — U071 COVID-19: Secondary | ICD-10-CM | POA: Diagnosis present

## 2021-01-12 DIAGNOSIS — N1832 Chronic kidney disease, stage 3b: Secondary | ICD-10-CM | POA: Diagnosis not present

## 2021-01-12 DIAGNOSIS — R0602 Shortness of breath: Secondary | ICD-10-CM | POA: Diagnosis not present

## 2021-01-12 DIAGNOSIS — F419 Anxiety disorder, unspecified: Secondary | ICD-10-CM | POA: Diagnosis present

## 2021-01-12 DIAGNOSIS — N390 Urinary tract infection, site not specified: Secondary | ICD-10-CM | POA: Diagnosis not present

## 2021-01-12 DIAGNOSIS — E162 Hypoglycemia, unspecified: Secondary | ICD-10-CM | POA: Diagnosis not present

## 2021-01-12 DIAGNOSIS — R4182 Altered mental status, unspecified: Secondary | ICD-10-CM

## 2021-01-12 DIAGNOSIS — F03918 Unspecified dementia, unspecified severity, with other behavioral disturbance: Secondary | ICD-10-CM | POA: Diagnosis present

## 2021-01-12 DIAGNOSIS — Z66 Do not resuscitate: Secondary | ICD-10-CM | POA: Diagnosis not present

## 2021-01-12 DIAGNOSIS — Z880 Allergy status to penicillin: Secondary | ICD-10-CM | POA: Diagnosis not present

## 2021-01-12 DIAGNOSIS — R6520 Severe sepsis without septic shock: Secondary | ICD-10-CM | POA: Diagnosis not present

## 2021-01-12 DIAGNOSIS — E1122 Type 2 diabetes mellitus with diabetic chronic kidney disease: Secondary | ICD-10-CM | POA: Diagnosis present

## 2021-01-12 DIAGNOSIS — Z79899 Other long term (current) drug therapy: Secondary | ICD-10-CM

## 2021-01-12 DIAGNOSIS — Z87891 Personal history of nicotine dependence: Secondary | ICD-10-CM | POA: Diagnosis not present

## 2021-01-12 DIAGNOSIS — Z743 Need for continuous supervision: Secondary | ICD-10-CM | POA: Diagnosis not present

## 2021-01-12 DIAGNOSIS — A4151 Sepsis due to Escherichia coli [E. coli]: Principal | ICD-10-CM | POA: Diagnosis present

## 2021-01-12 DIAGNOSIS — I517 Cardiomegaly: Secondary | ICD-10-CM | POA: Diagnosis not present

## 2021-01-12 DIAGNOSIS — A419 Sepsis, unspecified organism: Secondary | ICD-10-CM | POA: Diagnosis not present

## 2021-01-12 DIAGNOSIS — Z7901 Long term (current) use of anticoagulants: Secondary | ICD-10-CM

## 2021-01-12 DIAGNOSIS — I4821 Permanent atrial fibrillation: Secondary | ICD-10-CM | POA: Diagnosis not present

## 2021-01-12 DIAGNOSIS — I130 Hypertensive heart and chronic kidney disease with heart failure and stage 1 through stage 4 chronic kidney disease, or unspecified chronic kidney disease: Secondary | ICD-10-CM | POA: Diagnosis present

## 2021-01-12 DIAGNOSIS — I5022 Chronic systolic (congestive) heart failure: Secondary | ICD-10-CM | POA: Diagnosis not present

## 2021-01-12 DIAGNOSIS — Z7951 Long term (current) use of inhaled steroids: Secondary | ICD-10-CM

## 2021-01-12 DIAGNOSIS — E876 Hypokalemia: Secondary | ICD-10-CM | POA: Diagnosis present

## 2021-01-12 DIAGNOSIS — F0391 Unspecified dementia with behavioral disturbance: Secondary | ICD-10-CM | POA: Diagnosis present

## 2021-01-12 DIAGNOSIS — T68XXXD Hypothermia, subsequent encounter: Secondary | ICD-10-CM | POA: Diagnosis not present

## 2021-01-12 DIAGNOSIS — J449 Chronic obstructive pulmonary disease, unspecified: Secondary | ICD-10-CM | POA: Diagnosis not present

## 2021-01-12 DIAGNOSIS — R5381 Other malaise: Secondary | ICD-10-CM | POA: Diagnosis not present

## 2021-01-12 LAB — COMPREHENSIVE METABOLIC PANEL
ALT: 21 U/L (ref 0–44)
AST: 32 U/L (ref 15–41)
Albumin: 3.1 g/dL — ABNORMAL LOW (ref 3.5–5.0)
Alkaline Phosphatase: 57 U/L (ref 38–126)
Anion gap: 11 (ref 5–15)
BUN: 48 mg/dL — ABNORMAL HIGH (ref 8–23)
CO2: 23 mmol/L (ref 22–32)
Calcium: 8.1 mg/dL — ABNORMAL LOW (ref 8.9–10.3)
Chloride: 103 mmol/L (ref 98–111)
Creatinine, Ser: 2.08 mg/dL — ABNORMAL HIGH (ref 0.44–1.00)
GFR, Estimated: 24 mL/min — ABNORMAL LOW (ref >60)
Glucose, Bld: 292 mg/dL — ABNORMAL HIGH (ref 70–99)
Potassium: 3 mmol/L — ABNORMAL LOW (ref 3.5–5.1)
Sodium: 137 mmol/L (ref 135–145)
Total Bilirubin: 0.5 mg/dL (ref 0.3–1.2)
Total Protein: 6.5 g/dL (ref 6.5–8.1)

## 2021-01-12 LAB — CBC WITH DIFFERENTIAL/PLATELET
Abs Immature Granulocytes: 0.01 10*3/uL (ref 0.00–0.07)
Basophils Absolute: 0 10*3/uL (ref 0.0–0.1)
Basophils Relative: 0 %
Eosinophils Absolute: 0 10*3/uL (ref 0.0–0.5)
Eosinophils Relative: 0 %
HCT: 41.9 % (ref 36.0–46.0)
Hemoglobin: 12.7 g/dL (ref 12.0–15.0)
Immature Granulocytes: 0 %
Lymphocytes Relative: 15 %
Lymphs Abs: 0.8 10*3/uL (ref 0.7–4.0)
MCH: 26.7 pg (ref 26.0–34.0)
MCHC: 30.3 g/dL (ref 30.0–36.0)
MCV: 88 fL (ref 80.0–100.0)
Monocytes Absolute: 0.2 10*3/uL (ref 0.1–1.0)
Monocytes Relative: 3 %
Neutro Abs: 4.6 10*3/uL (ref 1.7–7.7)
Neutrophils Relative %: 82 %
Platelets: 178 10*3/uL (ref 150–400)
RBC: 4.76 MIL/uL (ref 3.87–5.11)
RDW: 16.5 % — ABNORMAL HIGH (ref 11.5–15.5)
WBC: 5.6 10*3/uL (ref 4.0–10.5)
nRBC: 0 % (ref 0.0–0.2)

## 2021-01-12 LAB — LACTIC ACID, PLASMA
Lactic Acid, Venous: >11 mmol/L (ref 0.5–1.9)
Lactic Acid, Venous: 3.2 mmol/L (ref 0.5–1.9)
Lactic Acid, Venous: 1.4 mmol/L (ref 0.5–1.9)
Lactic Acid, Venous: 1.1 mmol/L (ref 0.5–1.9)

## 2021-01-12 LAB — URINALYSIS, ROUTINE W REFLEX MICROSCOPIC
Bilirubin Urine: NEGATIVE
Glucose, UA: NEGATIVE mg/dL
Ketones, ur: NEGATIVE mg/dL
Nitrite: NEGATIVE
Protein, ur: 30 mg/dL — AB
Specific Gravity, Urine: 1.016 (ref 1.005–1.030)
WBC, UA: >50 WBC/hpf — ABNORMAL HIGH (ref 0–5)
pH: 5 (ref 5.0–8.0)

## 2021-01-12 LAB — CBG MONITORING, ED
Glucose-Capillary: 30 mg/dL — CL (ref 70–99)
Glucose-Capillary: 245 mg/dL — ABNORMAL HIGH (ref 70–99)
Glucose-Capillary: 67 mg/dL — ABNORMAL LOW (ref 70–99)
Glucose-Capillary: 82 mg/dL (ref 70–99)

## 2021-01-12 LAB — RESP PANEL BY RT-PCR (FLU A&B, COVID) ARPGX2
Influenza A by PCR: NEGATIVE
Influenza B by PCR: NEGATIVE
SARS Coronavirus 2 by RT PCR: POSITIVE — AB

## 2021-01-12 LAB — ETHANOL: Alcohol, Ethyl (B): <10 mg/dL (ref <10)

## 2021-01-12 MED ORDER — DEXTROSE 50 % IV SOLN
1.0000 | Freq: Once | INTRAVENOUS | Status: AC
  Administered 2021-01-12: 50 mL via INTRAVENOUS

## 2021-01-12 MED ORDER — QUETIAPINE FUMARATE 25 MG PO TABS
50.0000 mg | ORAL_TABLET | Freq: Every day | ORAL | Status: DC
  Administered 2021-01-12 – 2021-01-17 (×3): 50 mg via ORAL
  Filled 2021-01-12 (×4): qty 2

## 2021-01-12 MED ORDER — SODIUM CHLORIDE 0.9 % IV SOLN
100.0000 mg | Freq: Every day | INTRAVENOUS | Status: AC
  Administered 2021-01-13 – 2021-01-14 (×2): 100 mg via INTRAVENOUS
  Filled 2021-01-12 (×2): qty 20

## 2021-01-12 MED ORDER — RIVAROXABAN 15 MG PO TABS
15.0000 mg | ORAL_TABLET | Freq: Every day | ORAL | Status: DC
  Administered 2021-01-12 – 2021-01-17 (×6): 15 mg via ORAL
  Filled 2021-01-12 (×6): qty 1

## 2021-01-12 MED ORDER — POLYETHYLENE GLYCOL 3350 17 G PO PACK: 17.0000 g | PACK | Freq: Every day | ORAL | Status: DC | PRN

## 2021-01-12 MED ORDER — MEMANTINE HCL 10 MG PO TABS
10.0000 mg | ORAL_TABLET | Freq: Every day | ORAL | Status: DC
  Administered 2021-01-12 – 2021-01-18 (×6): 10 mg via ORAL
  Filled 2021-01-12 (×7): qty 1

## 2021-01-12 MED ORDER — SODIUM CHLORIDE 0.9 % IV SOLN
100.0000 mg | Freq: Once | INTRAVENOUS | Status: AC
  Administered 2021-01-12: 100 mg via INTRAVENOUS

## 2021-01-12 MED ORDER — BUDESONIDE 0.25 MG/2ML IN SUSP: 0.2500 mg | Freq: Two times a day (BID) | RESPIRATORY_TRACT | Status: DC

## 2021-01-12 MED ORDER — DEXTROSE 50 % IV SOLN
INTRAVENOUS | Status: AC
  Filled 2021-01-12: qty 50

## 2021-01-12 MED ORDER — SODIUM CHLORIDE 0.9 % IV SOLN
100.0000 mg | INTRAVENOUS | Status: AC
  Administered 2021-01-12: 100 mg via INTRAVENOUS
  Filled 2021-01-12 (×2): qty 20

## 2021-01-12 MED ORDER — ALPRAZOLAM 0.25 MG PO TABS
0.2500 mg | ORAL_TABLET | Freq: Three times a day (TID) | ORAL | Status: DC | PRN
  Administered 2021-01-14 – 2021-01-18 (×5): 0.25 mg via ORAL
  Filled 2021-01-12 (×6): qty 1

## 2021-01-12 MED ORDER — CARVEDILOL 12.5 MG PO TABS
25.0000 mg | ORAL_TABLET | Freq: Two times a day (BID) | ORAL | Status: DC
  Administered 2021-01-13 – 2021-01-18 (×11): 25 mg via ORAL
  Filled 2021-01-12 (×11): qty 2

## 2021-01-12 MED ORDER — LACTATED RINGERS IV BOLUS
1000.0000 mL | Freq: Once | INTRAVENOUS | Status: AC
  Administered 2021-01-12: 1000 mL via INTRAVENOUS

## 2021-01-12 MED ORDER — ONDANSETRON HCL 4 MG/2ML IJ SOLN: 4.0000 mg | Freq: Four times a day (QID) | INTRAMUSCULAR | Status: DC | PRN

## 2021-01-12 MED ORDER — IPRATROPIUM-ALBUTEROL 20-100 MCG/ACT IN AERS
1.0000 | INHALATION_SPRAY | Freq: Four times a day (QID) | RESPIRATORY_TRACT | Status: DC
  Administered 2021-01-12: 1 via RESPIRATORY_TRACT
  Filled 2021-01-12: qty 4

## 2021-01-12 MED ORDER — ACETAMINOPHEN 650 MG RE SUPP: 650.0000 mg | Freq: Four times a day (QID) | RECTAL | Status: DC | PRN

## 2021-01-12 MED ORDER — SODIUM CHLORIDE 0.9 % IV SOLN
1.0000 g | Freq: Once | INTRAVENOUS | Status: AC
  Administered 2021-01-12: 1 g via INTRAVENOUS
  Filled 2021-01-12: qty 10

## 2021-01-12 MED ORDER — SODIUM CHLORIDE 0.9 % IV SOLN
1.0000 g | INTRAVENOUS | Status: DC
  Administered 2021-01-13 – 2021-01-14 (×2): 1 g via INTRAVENOUS
  Filled 2021-01-12 (×3): qty 10

## 2021-01-12 MED ORDER — ALBUTEROL SULFATE (2.5 MG/3ML) 0.083% IN NEBU: 2.5000 mg | INHALATION_SOLUTION | RESPIRATORY_TRACT | Status: DC | PRN

## 2021-01-12 MED ORDER — ONDANSETRON HCL 4 MG PO TABS: 4.0000 mg | ORAL_TABLET | Freq: Four times a day (QID) | ORAL | Status: DC | PRN

## 2021-01-12 MED ORDER — ACETAMINOPHEN 325 MG PO TABS
650.0000 mg | ORAL_TABLET | Freq: Four times a day (QID) | ORAL | Status: DC | PRN
  Administered 2021-01-17: 650 mg via ORAL
  Filled 2021-01-12: qty 2

## 2021-01-12 MED ORDER — LACTATED RINGERS IV BOLUS
30.0000 mL/kg | Freq: Once | INTRAVENOUS | Status: AC
  Administered 2021-01-12: 1710 mL via INTRAVENOUS

## 2021-01-12 MED ORDER — POTASSIUM CHLORIDE CRYS ER 20 MEQ PO TBCR
40.0000 meq | EXTENDED_RELEASE_TABLET | Freq: Once | ORAL | Status: AC
  Administered 2021-01-12: 40 meq via ORAL
  Filled 2021-01-12: qty 2

## 2021-01-12 MED ORDER — IPRATROPIUM-ALBUTEROL 0.5-2.5 (3) MG/3ML IN SOLN: 3.0000 mL | Freq: Four times a day (QID) | RESPIRATORY_TRACT | Status: DC

## 2021-01-12 NOTE — ED Notes (Addendum)
Critical lab: Lactic of 3.2. MD made aware.

## 2021-01-12 NOTE — ED Notes (Signed)
Attempting to obtain 02 sat on pt, unsuccessful at this time. Respiratory in place. Lawyer in place.

## 2021-01-12 NOTE — ED Notes (Signed)
Patient found again without warming blanket and educated on the importance of keeping blanket on. This nurse turned patient's tv on per request and tried to decrease environmental stimuli. Patient given wet cloth to wet mouth with.

## 2021-01-12 NOTE — ED Notes (Signed)
Patient has kicked off warming blanket and educated the importance of keeping blanket on.

## 2021-01-12 NOTE — ED Notes (Signed)
Daughter Rebecca Huerta updated at this time about patient status and care.

## 2021-01-12 NOTE — H&P (Signed)
[facility] History and Physical  Rebecca Huerta IOE:[id number] DOB: 1945-11-25 DOA: 01/12/2021   PCP: Charlynne Pander, MD  Specialists: Unknown  Chief Complaint: Altered mental status  HPI: Rebecca Huerta is a 75 y.o. female with a past medical history of atrial fibrillation on anticoagulation, history of dementia, history of anxiety, history of chronic systolic CHF who was recently hospitalized and discharged on July 27 after being managed for acute systolic and diastolic CHF.  She also has a longstanding history of COPD and was wheezing as well during previous hospitalization.  She was discharged to skilled nursing facility.  According to reports patient was found at the SNF on the bathroom floor playing with her stool.  She was noted to be cool to touch.  Patient appears to be pleasantly confused.  Not much information is available from her.  She however denies any chest pain abdominal pain.  Denies any shortness of breath currently.  History is limited due to her dementia.  In the emergency department she was found to have hypoglycemia.  She was noted to be significantly hypothermic.  Lactic acid level was greater than 11.  UA was noted to be abnormal.  Incidentally she also tested positive for COVID-19 on August 1 in the skilled nursing facility.  Patient was hospitalized for further management.  Home Medications: Prior to Admission medications   Medication Sig Start Date End Date Taking? Authorizing Provider  ALPRAZolam (XANAX) 0.25 MG tablet Take 1 tablet (0.25 mg total) by mouth 3 (three) times daily as needed for anxiety. 01/03/21 02/02/21 Yes Shahmehdi, Seyed A, MD  budesonide (PULMICORT) 0.25 MG/2ML nebulizer solution Take 2 mLs (0.25 mg total) by nebulization 2 (two) times daily. 01/03/21  Yes Shahmehdi, Gemma Payor, MD  carvedilol (COREG) 25 MG tablet Take 1 tablet (25 mg total) by mouth 2 (two) times daily with a meal. 01/03/21 02/02/21 Yes Shahmehdi, Seyed A, MD  furosemide (LASIX) 40 MG  tablet Take 1 tablet (40 mg total) by mouth daily. 01/03/21 02/02/21 Yes Shahmehdi, Gemma Payor, MD  guaiFENesin (MUCINEX) 600 MG 12 hr tablet Take 2 tablets (1,200 mg total) by mouth 2 (two) times daily. 01/03/21 02/02/21 Yes Shahmehdi, Seyed A, MD  ipratropium-albuterol (DUONEB) 0.5-2.5 (3) MG/3ML SOLN Take 3 mLs by nebulization every 6 (six) hours. 01/03/21 02/02/21 Yes Shahmehdi, Gemma Payor, MD  memantine (NAMENDA) 10 MG tablet Take 1 tablet (10 mg total) by mouth daily. 01/04/21 02/03/21 Yes Shahmehdi, Gemma Payor, MD  Multiple Vitamin (MULTIVITAMIN) tablet Take 1 tablet by mouth daily.   Yes [provider]  Omega-3 Fatty Acids (FISH OIL) 1000 MG CAPS Take 1 capsule by mouth daily.   Yes [provider]  polyethylene glycol (MIRALAX / GLYCOLAX) 17 g packet Take 17 g by mouth daily as needed for mild constipation. 01/03/21  Yes Shahmehdi, Seyed A, MD  QUEtiapine (SEROQUEL) 50 MG tablet Take 1 tablet (50 mg total) by mouth at bedtime. 01/03/21 02/02/21 Yes Shahmehdi, Gemma Payor, MD  Rivaroxaban (XARELTO) 15 MG TABS tablet Take 1 tablet (15 mg total) by mouth daily with supper. 04/12/20  Yes Johnson, Clanford L, MD  sacubitril-valsartan (ENTRESTO) 24-26 MG Take 1 tablet by mouth 2 (two) times daily. 01/03/21 02/02/21 Yes Shahmehdi, Gemma Payor, MD  acetaminophen (TYLENOL) 325 MG tablet Take 2 tablets (650 mg total) by mouth every 6 (six) hours. 04/07/20   Johnson, Clanford L, MD  nicotine (NICODERM CQ - DOSED IN MG/24 HOURS) 21 mg/24hr patch Place 1 patch (21 mg total) onto the skin  daily. 01/03/21   Kendell BaneShahmehdi, Seyed A, MD    Allergies:  Allergies  Allergen Reactions   Penicillins Rash    Past Medical History: Past Medical History:  Diagnosis Date   A-fib (HCC)    Anxiety    CHF (congestive heart failure) (HCC)    a.  EF was previously 25-30% in 2015 and felt to be tachycardia-mediated b. EF normalized to 55-60% by echo in 2016   Hypertension     Past Surgical History:  Procedure Laterality Date    TUBAL LIGATION      Social History: Skilled nursing facility.  No current tobacco abuse.  Previous history of smoking is present.  Information is limited due to dementia.  Family History:  Unable to obtain due to her dementia  Review of Systems -unable to do due to dementia and altered mental status.  Physical Examination  Vitals:   01/12/21 1046 01/12/21 1115 01/12/21 1145 01/12/21 1239  BP: 126/84 124/81 131/73 124/82  Pulse: 88 90 96 88  Resp: 20 19 (!) 24 20  Temp: (!) 96.6 F (35.9 C) (!) 96.8 F (36 C) (!) 97 F (36.1 C) (!) 97.5 F (36.4 C)  TempSrc:      SpO2: 96% 96% 96% 100%  Weight:      Height:        BP 124/82 (BP Location: Right Arm)   Pulse 88   Temp (!) 97.5 F (36.4 C)   Resp 20   Ht 5\' 3"  (1.6 m)   Wt 57 kg   SpO2 100%   BMI 22.26 kg/m   General appearance: alert, cooperative, appears stated age, and distracted Head: Normocephalic, without obvious abnormality, atraumatic Eyes: conjunctivae/corneas clear. PERRL, EOM's intact. Throat: lips, mucosa, and tongue normal; teeth and gums normal Neck: no adenopathy, no carotid bruit, no JVD, supple, symmetrical, trachea midline, and thyroid not enlarged, symmetric, no tenderness/mass/nodules Resp: Normal effort.  No wheezing rhonchi or crackles appreciated on examination. Cardio: regular rate and rhythm, S1, S2 normal, no murmur, click, rub or gallop GI: soft, non-tender; bowel sounds normal; no masses,  no organomegaly Extremities: extremities normal, atraumatic, no cyanosis or edema Pulses: 2+ and symmetric Skin: Skin color, texture, turgor normal. No rashes or lesions Lymph nodes: Cervical, supraclavicular, and axillary nodes normal. Neurologic: She is awake alert.  No obvious focal neurological deficits but she is disoriented.    Labs on Admission: I have personally reviewed following labs and imaging studies  CBC: Recent Labs  Lab 01/12/21 0705  WBC 5.6  NEUTROABS 4.6  HGB 12.7  HCT 41.9   MCV 88.0  PLT 178   Basic Metabolic Panel: Recent Labs  Lab 01/12/21 0705  NA 137  K 3.0*  CL 103  CO2 23  GLUCOSE 292*  BUN 48*  CREATININE 2.08*  CALCIUM 8.1*   GFR: Estimated Creatinine Clearance: 19.3 mL/min (A) (by C-G formula based on SCr of 2.08 mg/dL (H)). Liver Function Tests: Recent Labs  Lab 01/12/21 0705  AST 32  ALT 21  ALKPHOS 57  BILITOT 0.5  PROT 6.5  ALBUMIN 3.1*    CBG: Recent Labs  Lab 01/12/21 0702 01/12/21 0732  GLUCAP 30* 245*     Radiological Exams on Admission: CT Head Wo Contrast  Result Date: 01/12/2021 CLINICAL DATA:  Altered mental status EXAM: CT HEAD WITHOUT CONTRAST TECHNIQUE: Contiguous axial images were obtained from the base of the skull through the vertex without intravenous contrast. COMPARISON:  01/03/2021 FINDINGS: Brain: There is atrophy and  chronic small vessel disease changes. Old right thalamic and bilateral basal ganglia lacunar infarcts. No acute intracranial abnormality. Specifically, no hemorrhage, hydrocephalus, mass lesion, acute infarction, or significant intracranial injury. Vascular: No hyperdense vessel or unexpected calcification. Skull: No acute calvarial abnormality. Sinuses/Orbits: No acute findings Other: None IMPRESSION: Old bilateral basal ganglia and right thalamic lacunar infarcts. Atrophy, chronic microvascular disease. No acute intracranial abnormality. Electronically Signed   By: Charlett Nose M.D.   On: 01/12/2021 09:34   DG Chest Port 1 View  Result Date: 01/12/2021 CLINICAL DATA:  75 year old female with history of altered mental status. Recent history of COVID infection. EXAM: PORTABLE CHEST 1 VIEW COMPARISON:  Chest x-ray 12/31/2020. FINDINGS: Lung volumes are normal. Mild diffuse peribronchial cuffing and interstitial prominence. No consolidative airspace disease. No definite pleural effusions. No pneumothorax. No evidence of pulmonary edema. Heart size is mildly enlarged. Upper mediastinal contours are  within normal limits. Atherosclerotic calcifications in the thoracic aorta. IMPRESSION: 1. Mild diffuse peribronchial cuffing and interstitial prominence concerning for acute bronchitis. 2. Mild cardiomegaly. 3. Aortic atherosclerosis. Electronically Signed   By: Trudie Reed M.D.   On: 01/12/2021 07:25      Problem List   Assessment: This is a 75 year old Caucasian female with past medical history as stated earlier who was recently hospitalized for acute CHF and COPD exacerbation and was discharged to a skilled nursing facility.  She tested positive for COVID-19 at the SNF on August 1.  Was found overnight in the SNF on the bathroom on the floor playing with her stool.  She was noted to be cold to touch.  She was brought into the hospital.  She has abnormal UA suggesting UTI.  She has hypothermia.  Could be due to sepsis from UTI.  However she was also noted to be hypoglycemic which could also be another reason for her hypothermia.  Plan:  #1. Sepsis secondary to UTI with hypothermia, present on admission: Patient initiated on ceftriaxone which will be continued.  Follow-up on urine cultures.  Abdomen is benign to examination.  Lactic acid was initially greater than 11.  Improving to 3.2.  Continue to trend.  Follow-up on blood cultures.  Noted to be saturating 100% on 4 L.  Oxygen was reduced to 2 L and saturations still in the late 90s.  Should be able to wean her off completely.  #2. COVID-19: Found to be positive for COVID-19 by PCR.  This was initially positive on August 1 and SNF.  Confirmed here in the hospital.  Patient however does not have any respiratory symptoms currently.  Chest x-ray does not show any opacities.  However she does have significant comorbidities and is at risk for decompensation if she were to have respiratory manifestations.  We will place her on Remdesivir for now and hopefully just give her a 3-day course.  We will check her CRP in the morning.  #3.  Chronic  systolic and diastolic CHF: EF is known to be 40% based on echocardiogram done in July.  Patient noted to be on Entresto which will be held due to elevated creatinine.  Seems to be fairly euvolemic at this time.  #4.  Acute kidney injury/hypokalemia: Creatinine noted to be 2.0 on presentation.  Creatinine was about 1.2 when discharged last week.  BUN is also elevated.  Could be due to sepsis and dehydration.  She has been given fluid boluses.  We will monitor urine output and recheck labs tomorrow.  We will be cautious with IV fluids  due to her history of CHF.  Avoid nephrotoxic agents.  Replace potassium.  Recheck labs in the morning.  #5.  History of dementia with behavioral disturbances: Continue Namenda.  #6. Acute metabolic encephalopathy: In the setting of dementia.  Most likely due to sepsis.  No focal neurological deficits noted.  CT head did not show any acute findings.  Old infarcts were noted.  #7. Permanent atrial fibrillation: Rate is controlled.  Continue carvedilol.  Anticoagulated with Xarelto.  #8. Essential hypertension: Monitor blood pressures closely.  #9. Hypoglycemia: Possibly due to poor oral intake.  Noted to have a glucose level of 30.  Was given D50 with improvement to 45.  We will recheck later today. She is not a known diabetic.  Not on any glucose lowering agents at home.   DVT Prophylaxis: Anticoagulated with Xarelto Code Status: DNR  Family Communication: Daughter was updated over the phone. Disposition: Hopefully back to SNF when medically stable Consults called: None Admission Status: Status is: Inpatient  Remains inpatient appropriate because:Altered mental status, IV treatments appropriate due to intensity of illness or inability to take PO, and Inpatient level of care appropriate due to severity of illness  Dispo: The patient is from: SNF              Anticipated d/c is to: SNF              Patient currently is not medically stable to d/c.   Difficult to  place patient No    Severity of Illness: The appropriate patient status for this patient is INPATIENT. Inpatient status is judged to be reasonable and necessary in order to provide the required intensity of service to ensure the patient's safety. The patient's presenting symptoms, physical exam findings, and initial radiographic and laboratory data in the context of their chronic comorbidities is felt to place them at high risk for further clinical deterioration. Furthermore, it is not anticipated that the patient will be medically stable for discharge from the hospital within 2 midnights of admission. The following factors support the patient status of inpatient.   " The patient's presenting symptoms include altered mental status. " The worrisome physical exam findings include hypothermia. " The initial radiographic and laboratory data are worrisome because of hypoglycemia, abnormal UA. " The chronic co-morbidities include dementia, CHF.   * I certify that at the point of admission it is my clinical judgment that the patient will require inpatient hospital care spanning beyond 2 midnights from the point of admission due to high intensity of service, high risk for further deterioration and high frequency of surveillance required.*   Further management decisions will depend on results of further testing and patient's response to treatment.   Rebecca Huerta [facility]  [facility] on [email or web address]  01/12/2021, 1:23 PM

## 2021-01-12 NOTE — ED Provider Notes (Signed)
[facility] EMERGENCY DEPARTMENT Provider Note  CSN: [id number] Arrival date & time: 01/12/21 6270    History Chief Complaint  Patient presents with   Altered Mental Status    Rebecca Huerta is a 75 y.o. female with history of afib (on Xarelto), CHF and COPD recent admitted to the hospital for CHF exacerbation and discharged to [name] 7/27 brought to the ED via EMS from [place] after being found on the floor of the bathroom, confused and cold. She is normally alert and follows commands but has some confusion at baseline. She denies any complaints but is unable to give details of her history. Patient reportedly tested positive for Covid 8/1 at facility.    Past Medical History:  Diagnosis Date   A-fib (HCC)    Anxiety    CHF (congestive heart failure) (HCC)    a.  EF was previously 25-30% in 2015 and felt to be tachycardia-mediated b. EF normalized to 55-60% by echo in 2016   Hypertension     Past Surgical History:  Procedure Laterality Date   TUBAL LIGATION      No family history on file.  Social History   Tobacco Use   Smoking status: Every Day    Packs/day: 0.50    Types: Cigarettes    Start date: 05/17/1961   Smokeless tobacco: Never  Vaping Use   Vaping Use: Never used  Substance Use Topics   Alcohol use: Yes   Drug use: No     Home Medications Prior to Admission medications   Medication Sig Start Date End Date Taking? Authorizing Provider  acetaminophen (TYLENOL) 325 MG tablet Take 2 tablets (650 mg total) by mouth every 6 (six) hours. 04/07/20   Johnson, Clanford L, MD  ALPRAZolam (XANAX) 0.25 MG tablet Take 1 tablet (0.25 mg total) by mouth 3 (three) times daily as needed for anxiety. 01/03/21 02/02/21  Shahmehdi, Gemma Payor, MD  budesonide (PULMICORT) 0.25 MG/2ML nebulizer solution Take 2 mLs (0.25 mg total) by nebulization 2 (two) times daily. 01/03/21   Shahmehdi, Gemma Payor, MD  carvedilol (COREG) 25 MG tablet Take 1 tablet (25 mg total) by mouth 2 (two) times daily  with a meal. 01/03/21 02/02/21  Shahmehdi, Gemma Payor, MD  furosemide (LASIX) 40 MG tablet Take 1 tablet (40 mg total) by mouth daily. 01/03/21 02/02/21  Kendell Bane, MD  guaiFENesin (MUCINEX) 600 MG 12 hr tablet Take 2 tablets (1,200 mg total) by mouth 2 (two) times daily. 01/03/21 02/02/21  Shahmehdi, Gemma Payor, MD  ipratropium-albuterol (DUONEB) 0.5-2.5 (3) MG/3ML SOLN Take 3 mLs by nebulization every 6 (six) hours. 01/03/21 02/02/21  Shahmehdi, Gemma Payor, MD  memantine (NAMENDA) 10 MG tablet Take 1 tablet (10 mg total) by mouth daily. 01/04/21 02/03/21  Kendell Bane, MD  Multiple Vitamin (MULTIVITAMIN) tablet Take 1 tablet by mouth daily.    [provider]  nicotine (NICODERM CQ - DOSED IN MG/24 HOURS) 21 mg/24hr patch Place 1 patch (21 mg total) onto the skin daily. 01/03/21   Shahmehdi, Gemma Payor, MD  Omega-3 Fatty Acids (FISH OIL) 1000 MG CAPS Take 1 capsule by mouth daily.    [provider]  polyethylene glycol (MIRALAX / GLYCOLAX) 17 g packet Take 17 g by mouth daily as needed for mild constipation. 01/03/21   Shahmehdi, Gemma Payor, MD  QUEtiapine (SEROQUEL) 50 MG tablet Take 1 tablet (50 mg total) by mouth at bedtime. 01/03/21 02/02/21  Kendell Bane, MD  Rivaroxaban (XARELTO) 15 MG TABS  tablet Take 1 tablet (15 mg total) by mouth daily with supper. 04/12/20   Johnson, Clanford L, MD  sacubitril-valsartan (ENTRESTO) 24-26 MG Take 1 tablet by mouth 2 (two) times daily. 01/03/21 02/02/21  Kendell BaneShahmehdi, Seyed A, MD     Allergies    Penicillins   Review of Systems   Review of Systems Unable to assess due to mental status.    Physical Exam BP 112/74   Pulse 91   Temp (!) 97 F (36.1 C)   Resp 18   Ht 5\' 3"  (1.6 m)   Wt 57 kg   SpO2 96%   BMI 22.26 kg/m   Physical Exam Vitals and nursing note reviewed.  Constitutional:      Appearance: Normal appearance.  HENT:     Head: Normocephalic and atraumatic.     Nose: Nose normal.     Mouth/Throat:     Mouth: Mucous  membranes are moist.  Eyes:     Extraocular Movements: Extraocular movements intact.     Conjunctiva/sclera: Conjunctivae normal.  Cardiovascular:     Rate and Rhythm: Normal rate.  Pulmonary:     Effort: Pulmonary effort is normal.     Breath sounds: Normal breath sounds.  Abdominal:     General: Abdomen is flat.     Palpations: Abdomen is soft.     Tenderness: There is no abdominal tenderness.  Musculoskeletal:        General: No swelling. Normal range of motion.     Cervical back: Neck supple.  Skin:    General: Skin is dry.     Comments: Cold to touch  Neurological:     General: No focal deficit present.     Mental Status: She is alert. She is disoriented.     Cranial Nerves: No cranial nerve deficit.     Motor: No weakness.  Psychiatric:        Mood and Affect: Mood normal.     ED Results / Procedures / Treatments   Labs (all labs ordered are listed, but only abnormal results are displayed) Labs Reviewed  RESP PANEL BY RT-PCR (FLU A&B, COVID) ARPGX2 - Abnormal; Notable for the following components:      Result Value   SARS Coronavirus 2 by RT PCR POSITIVE (*)    All other components within normal limits  COMPREHENSIVE METABOLIC PANEL - Abnormal; Notable for the following components:   Potassium 3.0 (*)    Glucose, Bld 292 (*)    BUN 48 (*)    Creatinine, Ser 2.08 (*)    Calcium 8.1 (*)    Albumin 3.1 (*)    GFR, Estimated 24 (*)    All other components within normal limits  CBC WITH DIFFERENTIAL/PLATELET - Abnormal; Notable for the following components:   RDW 16.5 (*)    All other components within normal limits  URINALYSIS, ROUTINE W REFLEX MICROSCOPIC - Abnormal; Notable for the following components:   Color, Urine AMBER (*)    APPearance CLOUDY (*)    Hgb urine dipstick MODERATE (*)    Protein, ur 30 (*)    Leukocytes,Ua LARGE (*)    WBC, UA >50 (*)    Bacteria, UA MANY (*)    All other components within normal limits  LACTIC ACID, PLASMA - Abnormal;  Notable for the following components:   Lactic Acid, Venous >11 (*)    All other components within normal limits  CBG MONITORING, ED - Abnormal; Notable for the following components:  Glucose-Capillary 30 (*)    All other components within normal limits  CBG MONITORING, ED - Abnormal; Notable for the following components:   Glucose-Capillary 245 (*)    All other components within normal limits  CULTURE, BLOOD (ROUTINE X 2)  CULTURE, BLOOD (ROUTINE X 2)  ETHANOL  LACTIC ACID, PLASMA    EKG None  Radiology CT Head Wo Contrast  Result Date: 01/12/2021 CLINICAL DATA:  Altered mental status EXAM: CT HEAD WITHOUT CONTRAST TECHNIQUE: Contiguous axial images were obtained from the base of the skull through the vertex without intravenous contrast. COMPARISON:  01/03/2021 FINDINGS: Brain: There is atrophy and chronic small vessel disease changes. Old right thalamic and bilateral basal ganglia lacunar infarcts. No acute intracranial abnormality. Specifically, no hemorrhage, hydrocephalus, mass lesion, acute infarction, or significant intracranial injury. Vascular: No hyperdense vessel or unexpected calcification. Skull: No acute calvarial abnormality. Sinuses/Orbits: No acute findings Other: None IMPRESSION: Old bilateral basal ganglia and right thalamic lacunar infarcts. Atrophy, chronic microvascular disease. No acute intracranial abnormality. Electronically Signed   By: Charlett Nose M.D.   On: 01/12/2021 09:34   DG Chest Port 1 View  Result Date: 01/12/2021 CLINICAL DATA:  75 year old female with history of altered mental status. Recent history of COVID infection. EXAM: PORTABLE CHEST 1 VIEW COMPARISON:  Chest x-ray 12/31/2020. FINDINGS: Lung volumes are normal. Mild diffuse peribronchial cuffing and interstitial prominence. No consolidative airspace disease. No definite pleural effusions. No pneumothorax. No evidence of pulmonary edema. Heart size is mildly enlarged. Upper mediastinal contours are  within normal limits. Atherosclerotic calcifications in the thoracic aorta. IMPRESSION: 1. Mild diffuse peribronchial cuffing and interstitial prominence concerning for acute bronchitis. 2. Mild cardiomegaly. 3. Aortic atherosclerosis. Electronically Signed   By: Trudie Reed M.D.   On: 01/12/2021 07:25    Procedures .Critical Care  Date/Time: 01/12/2021 9:57 AM Performed by: Pollyann Savoy, MD Authorized by: Pollyann Savoy, MD   Critical care provider statement:    Critical care time (minutes):  45   Critical care was necessary to treat or prevent imminent or life-threatening deterioration of the following conditions:  Renal failure, sepsis and metabolic crisis   Critical care was time spent personally by me on the following activities:  Discussions with consultants, evaluation of patient's response to treatment, examination of patient, ordering and performing treatments and interventions, ordering and review of laboratory studies, ordering and review of radiographic studies, pulse oximetry, re-evaluation of patient's condition, obtaining history from patient or surrogate and review of old charts  Medications Ordered in the ED Medications  dextrose 50 % solution 50 mL (50 mLs Intravenous Not Given 01/12/21 0734)  cefTRIAXone (ROCEPHIN) 1 g in sodium chloride 0.9 % 100 mL IVPB (0 g Intravenous Stopped 01/12/21 0948)  lactated ringers bolus 1,000 mL (1,000 mLs Intravenous New Bag/Given 01/12/21 0832)  lactated ringers bolus 1,710 mL (1,710 mLs Intravenous New Bag/Given 01/12/21 0955)     MDM Rules/Calculators/A&P MDM Patient noted to be hypothermic and hypoglycemic on arrival. Will give D50 and recheck. No documented history of DM and not on DM meds. Check basic labs, CXR and CT. Per recent admission, patient DNR but per SNF patient is Full Code. Will clarify with family when they arrive.   ED Course  I have reviewed the triage vital signs and the nursing notes.  Pertinent labs &  imaging results that were available during my care of the patient were reviewed by me and considered in my medical decision making (see chart for details).  Clinical  Course as of 01/12/21 0959  Wed Jan 12, 2021  0742 Glucose improving with D50. Temp foley and warming blanket for hypothermia.  [CS]  0757 UA concerning for UTI. Will begin Rocephin.  [CS]  0757 CBC is normal.  [CS]  0814 CMP with worsening Cr from baseline. Will give IVF.  [CS]  [phone] Covid confirmed positive. Lactic acid is markedly elevated, will give LR bolus to clear. Could be sepsis, although no tachycardia or hypotension.  [CS]  [phone] Spoke with Dr. Maryln Manuel, Hospitalist, who will evaluate for admission.  [CS]    Clinical Course User Index [CS] Pollyann Savoy, MD    Final Clinical Impression(s) / ED Diagnoses Final diagnoses:  Altered mental status, unspecified altered mental status type  AKI (acute kidney injury) (HCC)  Hypoglycemia  Hypothermia, initial encounter  Sepsis with acute renal failure without septic shock, due to unspecified organism, unspecified acute renal failure type (HCC)  Lower urinary tract infection, acute    Rx / DC Orders ED Discharge Orders     None        Pollyann Savoy, MD 01/12/21 [phone]

## 2021-01-12 NOTE — ED Notes (Signed)
Assisted the patient in eating and giving the patient juice.  Patient doesn't want to eat or drink.

## 2021-01-12 NOTE — ED Triage Notes (Addendum)
Pt BIB [facility] from [facility] for AMS. Found in bathroom floor playing with stool and was cold. Normally a/ox4. Covid pos (+) Monday 01/10/21

## 2021-01-12 NOTE — ED Notes (Signed)
Report given to Kristen, RN at this time.

## 2021-01-13 ENCOUNTER — Encounter (HOSPITAL_COMMUNITY): Payer: Self-pay | Admitting: Internal Medicine

## 2021-01-13 DIAGNOSIS — N1832 Chronic kidney disease, stage 3b: Secondary | ICD-10-CM

## 2021-01-13 DIAGNOSIS — T68XXXD Hypothermia, subsequent encounter: Secondary | ICD-10-CM

## 2021-01-13 LAB — COMPREHENSIVE METABOLIC PANEL
ALT: 25 U/L (ref 0–44)
AST: 80 U/L — ABNORMAL HIGH (ref 15–41)
Albumin: 2.6 g/dL — ABNORMAL LOW (ref 3.5–5.0)
Alkaline Phosphatase: 47 U/L (ref 38–126)
Anion gap: 5 (ref 5–15)
BUN: 36 mg/dL — ABNORMAL HIGH (ref 8–23)
CO2: 23 mmol/L (ref 22–32)
Calcium: 7.7 mg/dL — ABNORMAL LOW (ref 8.9–10.3)
Chloride: 113 mmol/L — ABNORMAL HIGH (ref 98–111)
Creatinine, Ser: 1.39 mg/dL — ABNORMAL HIGH (ref 0.44–1.00)
GFR, Estimated: 40 mL/min — ABNORMAL LOW (ref >60)
Glucose, Bld: 91 mg/dL (ref 70–99)
Potassium: 3.5 mmol/L (ref 3.5–5.1)
Sodium: 141 mmol/L (ref 135–145)
Total Bilirubin: 0.4 mg/dL (ref 0.3–1.2)
Total Protein: 5.5 g/dL — ABNORMAL LOW (ref 6.5–8.1)

## 2021-01-13 LAB — CBG MONITORING, ED
Glucose-Capillary: 87 mg/dL (ref 70–99)
Glucose-Capillary: 78 mg/dL (ref 70–99)
Glucose-Capillary: 87 mg/dL (ref 70–99)
Glucose-Capillary: 77 mg/dL (ref 70–99)

## 2021-01-13 LAB — CBC
HCT: 36.2 % (ref 36.0–46.0)
Hemoglobin: 10.9 g/dL — ABNORMAL LOW (ref 12.0–15.0)
MCH: 26.8 pg (ref 26.0–34.0)
MCHC: 30.1 g/dL (ref 30.0–36.0)
MCV: 88.9 fL (ref 80.0–100.0)
Platelets: 153 10*3/uL (ref 150–400)
RBC: 4.07 MIL/uL (ref 3.87–5.11)
RDW: 16.9 % — ABNORMAL HIGH (ref 11.5–15.5)
WBC: 6.9 10*3/uL (ref 4.0–10.5)
nRBC: 0 % (ref 0.0–0.2)

## 2021-01-13 LAB — LACTIC ACID, PLASMA: Lactic Acid, Venous: 1.2 mmol/L (ref 0.5–1.9)

## 2021-01-13 LAB — C-REACTIVE PROTEIN: CRP: 2.7 mg/dL — ABNORMAL HIGH (ref <1.0)

## 2021-01-13 MED ORDER — IPRATROPIUM-ALBUTEROL 20-100 MCG/ACT IN AERS: 1.0000 | INHALATION_SPRAY | Freq: Four times a day (QID) | RESPIRATORY_TRACT | Status: DC | PRN

## 2021-01-13 MED ORDER — CHLORHEXIDINE GLUCONATE CLOTH 2 % EX PADS
6.0000 | MEDICATED_PAD | Freq: Every day | CUTANEOUS | Status: DC
  Administered 2021-01-14 – 2021-01-18 (×4): 6 via TOPICAL

## 2021-01-13 MED ORDER — SODIUM CHLORIDE 0.9 % IV SOLN: INTRAVENOUS | Status: AC

## 2021-01-13 NOTE — Progress Notes (Signed)
And in PROGRESS NOTE    Rebecca Huerta  DEY:[id number] DOB: 02-Aug-1945 DOA: 01/12/2021 PCP: Caprice Renshaw, MD   Chief Complaint  Patient presents with   Altered Mental Status    Brief admission narrative:  As per H&P written by Dr. Maryland Pink on 01/12/2021 Rebecca Huerta is a 75 y.o. female with a past medical history of atrial fibrillation on anticoagulation, history of dementia, history of anxiety, history of chronic systolic CHF who was recently hospitalized and discharged on July 27 after being managed for acute systolic and diastolic CHF.  She also has a longstanding history of COPD and was wheezing as well during previous hospitalization.  She was discharged to skilled nursing facility.  According to reports patient was found at the SNF on the bathroom floor playing with her stool.  She was noted to be cool to touch.  Patient appears to be pleasantly confused.  Not much information is available from her.  She however denies any chest pain abdominal pain.  Denies any shortness of breath currently.  History is limited due to her dementia.   In the emergency department she was found to have hypoglycemia.  She was noted to be significantly hypothermic.  Lactic acid level was greater than 11.  UA was noted to be abnormal.  Incidentally she also tested positive for COVID-19 on August 1 in the skilled nursing facility.  Patient was hospitalized for further management.  Assessment & Plan: 1-sepsis secondary to UTI: Present on admission -Patient met criteria on presentation with hypothermia, hepatic encephalopathy, abnormal urinalysis suggesting UTI and elevated lactic acid. -Significant improvement with fluid resuscitation and initiation on empiric antibiotic -Continue supportive care and constant reorientation. -Follow clinical response. -Follow final culture results. -Continue to maintain adequate hydration (with daily shoes fluid resuscitation given history of CHF).  2-Chronic systolic heart failure  (HCC) -Currently dry on examination -Most recent ejection fraction 40% from 2D echo done in July 2022 -Continue to follow daily weights, strict I's and O's and closely monitor sodium intake.  3-COVID-19 infection -Appears to be incidental -No requiring oxygen supplementation -Chest x-ray without acute infiltrates. -Patient will complete a total of 3 days remdesivir infusion given risk factors -Continue supportive care and symptomatic management.  4-acute kidney injury on chronic kidney disease a stage IIIb -Continue to minimize/avoid nephrotoxic agents-maintain adequate hydration -Follow-up renal function trend and stability   5-hypokalemia -Repleted and within normal limits currently -Continue monitoring on telemetry.  6-acute metabolic encephalopathy in the setting of dementia and acute UTI -Continue treatment as mentioned above -Consider mentation and supportive care.  7-history of dementia with behavioral disturbance and -Continue Namenda -Continue supportive care  8-permanent atrial fibrillation -Continue carvedilol and the use of Xarelto for secondary prevention.  9-essential hypertension -Stable and well-controlled -Continue current antihypertensive agents. -Closely follow vital signs.  10-hypoglycemia -Probably in the setting of poor oral intake -On presentation glucose level in the 30s range -No prior history of diabetes and no using any hypoglycemic agents -Continue to encourage oral intake -Continue to follow CBGs intermittently. -Stable since supplementation provided     DVT prophylaxis: Chronically anticoagulated on Xarelto. Code Status: DNR/DNI Family Communication: No family at bedside. Disposition:   Status is: Inpatient  Remains inpatient appropriate because:IV treatments appropriate due to intensity of illness or inability to take PO  Dispo:  Patient From: [place]  Planned Disposition: [place]  Medically  stable for discharge: No       Consultants:  None  Procedures:  See below  for x-ray reports.  Antimicrobials:  Rocephin   Subjective: No chest pain, no nausea or vomiting.  Decreased oral intake and appetite.  Chronically ill and frail on exam.  Objective: Vitals:   01/13/21 1300 01/13/21 1500 01/13/21 1600 01/13/21 1615  BP:  139/89 (!) 142/83 (!) 159/82  Pulse:   67 67  Resp:  _0 Temp:  99.3 F (37.4 C) 99.3 F (37.4 C) 99.5 F (37.5 C)  TempSrc:      SpO2: 95% 96% 98% 95%  Weight:      Height:        Intake/Output Summary (Last 24 hours) at 01/13/2021 1824 Last data filed at 01/13/2021 9381 Gross per 24 hour  Intake --  Output 700 ml  Net -700 ml   Filed Weights   01/12/21 0736  Weight: 57 kg    Examination: General exam: Appears calm and comfortable; pleasantly confused no distress.  Following commands.  Still with decreased oral intake and appetite.  No longer hypothermic. Respiratory system: Intermittent mild tachypnea with exertion; expressed no shortness of breath sensation.  Good saturation on room air.  No using accessory muscle.  Positive scattered rhonchi, no wheezing or crackles. Cardiovascular system: S1 & S2 heard, no rubs, no gallops, no JVD; RRR. Gastrointestinal system: Abdomen is nondistended, soft and nontender. No organomegaly or masses felt. Normal bowel sounds heard. Central nervous system: No new focal neurological deficits. Extremities: No cyanosis or clubbing. Skin: No petechiae. Psychiatry: Insight and judgment affected due to underlying dementia; mood & affect appropriate.     Data Reviewed: I have personally reviewed following labs and imaging studies  CBC: Recent Labs  Lab 01/12/21 0705 01/13/21 0506  WBC 5.6 6.9  NEUTROABS 4.6  --   HGB 12.7 10.9*  HCT 41.9 36.2  MCV 88.0 88.9  PLT 178 829    Basic Metabolic Panel: Recent Labs  Lab 01/12/21 0705 01/13/21 0506  NA 137 141  K 3.0* 3.5  CL 103 113*  CO2 23  23  GLUCOSE 292* 91  BUN 48* 36*  CREATININE 2.08* 1.39*  CALCIUM 8.1* 7.7*    GFR: Estimated Creatinine Clearance: 28.9 mL/min (A) (by C-G formula based on SCr of 1.39 mg/dL (H)).  Liver Function Tests: Recent Labs  Lab 01/12/21 0705 01/13/21 0506  AST 32 80*  ALT 21 25  ALKPHOS 57 47  BILITOT 0.5 0.4  PROT 6.5 5.5*  ALBUMIN 3.1* 2.6*    CBG: Recent Labs  Lab 01/12/21 1945 01/12/21 2041 01/13/21 0631 01/13/21 0858 01/13/21 1607  GLUCAP 67* 82 87 78 87     Recent Results (from the past 240 hour(s))  Resp Panel by RT-PCR (Flu A&B, Covid) Urine, Catheterized     Status: Abnormal   Collection Time: 01/12/21  7:31 AM   Specimen: Urine, Catheterized; Nasopharyngeal(NP) swabs in vial transport medium  Result Value Ref Range Status   SARS Coronavirus 2 by RT PCR POSITIVE (A) NEGATIVE Final    Comment: CRITICAL RESULT CALLED TO, READ BACK BY AND VERIFIED WITH: LONG,J AT 0935 ON 8.3.22 BY RUCINSKI,B (NOTE) SARS-CoV-2 target nucleic acids are DETECTED.  The SARS-CoV-2 RNA is generally detectable in upper respiratory specimens during the acute phase of infection. Positive results are indicative of the presence of the identified virus, but do not rule out bacterial infection or co-infection with other pathogens not detected by the test. Clinical correlation with patient history and other diagnostic information is necessary to determine patient infection status. The expected  result is Negative.  Fact Sheet for Patients: EntrepreneurPulse.com.au  Fact Sheet for Healthcare Providers: IncredibleEmployment.be  This test is not yet approved or cleared by the [place] FDA and  has been authorized for detection and/or diagnosis of SARS-CoV-2 by FDA under an Emergency Use Authorization (EUA).  This EUA will remain in effect (meaning th is test can be used) for the duration of  the COVID-19 declaration under Section 564(b)(1) of the  Act, 21 U.S.C. section 360bbb-3(b)(1), unless the authorization is terminated or revoked sooner.     Influenza A by PCR NEGATIVE NEGATIVE Final   Influenza B by PCR NEGATIVE NEGATIVE Final    Comment: (NOTE) The Xpert Xpress SARS-CoV-2/FLU/RSV plus assay is intended as an aid in the diagnosis of influenza from Nasopharyngeal swab specimens and should not be used as a sole basis for treatment. Nasal washings and aspirates are unacceptable for Xpert Xpress SARS-CoV-2/FLU/RSV testing.  Fact Sheet for Patients: EntrepreneurPulse.com.au  Fact Sheet for Healthcare Providers: IncredibleEmployment.be  This test is not yet approved or cleared by the [place] FDA and has been authorized for detection and/or diagnosis of SARS-CoV-2 by FDA under an Emergency Use Authorization (EUA). This EUA will remain in effect (meaning this test can be used) for the duration of the COVID-19 declaration under Section 564(b)(1) of the Act, 21 U.S.C. section 360bbb-3(b)(1), unless the authorization is terminated or revoked.  Performed at [facility], [address], [place], [place] [address]   Culture, blood (routine x 2)     Status: None (Preliminary result)   Collection Time: 01/12/21  9:08 AM   Specimen: BLOOD RIGHT HAND  Result Value Ref Range Status   Specimen Description BLOOD RIGHT HAND  Final   Special Requests   Final    AEROBIC BOTTLE ONLY Blood Culture results may not be optimal due to an inadequate volume of blood received in culture bottles Performed at [facility], [address], [place], [place] [address]    Culture PENDING  Incomplete   Report Status PENDING  Incomplete  Culture, blood (routine x 2)     Status: None (Preliminary result)   Collection Time: 01/12/21  9:09 AM   Specimen: Right Antecubital; Blood  Result Value Ref Range Status   Specimen Description RIGHT ANTECUBITAL  Final   Special Requests   Final    BOTTLES DRAWN AEROBIC AND  ANAEROBIC Blood Culture results may not be optimal due to an excessive volume of blood received in culture bottles Performed at [id number], [address], [place], [place] [address]    Culture PENDING  Incomplete   Report Status PENDING  Incomplete     Radiology Studies: CT Head Wo Contrast  Result Date: 01/12/2021 CLINICAL DATA:  Altered mental status EXAM: CT HEAD WITHOUT CONTRAST TECHNIQUE: Contiguous axial images were obtained from the base of the skull through the vertex without intravenous contrast. COMPARISON:  01/03/2021 FINDINGS: Brain: There is atrophy and chronic small vessel disease changes. Old right thalamic and bilateral basal ganglia lacunar infarcts. No acute intracranial abnormality. Specifically, no hemorrhage, hydrocephalus, mass lesion, acute infarction, or significant intracranial injury. Vascular: No hyperdense vessel or unexpected calcification. Skull: No acute calvarial abnormality. Sinuses/Orbits: No acute findings Other: None IMPRESSION: Old bilateral basal ganglia and right thalamic lacunar infarcts. Atrophy, chronic microvascular disease. No acute intracranial abnormality. Electronically Signed   By: Rolm Baptise M.D.   On: 01/12/2021 09:34   DG Chest Port 1 View  Result Date: 01/12/2021 CLINICAL DATA:  75 year old female with  history of altered mental status. Recent history of COVID infection. EXAM: PORTABLE CHEST 1 VIEW COMPARISON:  Chest x-ray 12/31/2020. FINDINGS: Lung volumes are normal. Mild diffuse peribronchial cuffing and interstitial prominence. No consolidative airspace disease. No definite pleural effusions. No pneumothorax. No evidence of pulmonary edema. Heart size is mildly enlarged. Upper mediastinal contours are within normal limits. Atherosclerotic calcifications in the thoracic aorta. IMPRESSION: 1. Mild diffuse peribronchial cuffing and interstitial prominence concerning for acute bronchitis. 2. Mild cardiomegaly. 3. Aortic atherosclerosis.  Electronically Signed   By: Vinnie Langton M.D.   On: 01/12/2021 07:25     Scheduled Meds:  carvedilol  25 mg Oral BID WC   memantine  10 mg Oral Daily   QUEtiapine  50 mg Oral QHS   Rivaroxaban  15 mg Oral Q supper   Continuous Infusions:  cefTRIAXone (ROCEPHIN)  IV 1 g (01/13/21 0935)   remdesivir 100 mg in NS 100 mL 100 mg (01/13/21 0938)     LOS: 1 day    Time spent: 35 minutes.    Barton Dubois, MD [facility]   To contact the attending provider between 7A-7P or the covering provider during after hours 7P-7A, please log into the web site [email or web address] and access using universal [place] password for that web site. If you do not have the password, please call the hospital operator.  01/13/2021, 6:24 PM

## 2021-01-14 DIAGNOSIS — R4182 Altered mental status, unspecified: Secondary | ICD-10-CM

## 2021-01-14 LAB — CBC
HCT: 35.8 % — ABNORMAL LOW (ref 36.0–46.0)
Hemoglobin: 10.8 g/dL — ABNORMAL LOW (ref 12.0–15.0)
MCH: 26.9 pg (ref 26.0–34.0)
MCHC: 30.2 g/dL (ref 30.0–36.0)
MCV: 89.1 fL (ref 80.0–100.0)
Platelets: 142 10*3/uL — ABNORMAL LOW (ref 150–400)
RBC: 4.02 MIL/uL (ref 3.87–5.11)
RDW: 17 % — ABNORMAL HIGH (ref 11.5–15.5)
WBC: 4.2 10*3/uL (ref 4.0–10.5)
nRBC: 0 % (ref 0.0–0.2)

## 2021-01-14 LAB — BASIC METABOLIC PANEL
Anion gap: 5 (ref 5–15)
BUN: 28 mg/dL — ABNORMAL HIGH (ref 8–23)
CO2: 24 mmol/L (ref 22–32)
Calcium: 7.6 mg/dL — ABNORMAL LOW (ref 8.9–10.3)
Chloride: 111 mmol/L (ref 98–111)
Creatinine, Ser: 0.96 mg/dL (ref 0.44–1.00)
GFR, Estimated: >60 mL/min (ref >60)
Glucose, Bld: 75 mg/dL (ref 70–99)
Potassium: 3.5 mmol/L (ref 3.5–5.1)
Sodium: 140 mmol/L (ref 135–145)

## 2021-01-14 LAB — GLUCOSE, CAPILLARY
Glucose-Capillary: 95 mg/dL (ref 70–99)
Glucose-Capillary: 78 mg/dL (ref 70–99)
Glucose-Capillary: 91 mg/dL (ref 70–99)

## 2021-01-14 LAB — C-REACTIVE PROTEIN: CRP: 5 mg/dL — ABNORMAL HIGH (ref <1.0)

## 2021-01-14 LAB — MRSA NEXT GEN BY PCR, NASAL: MRSA by PCR Next Gen: DETECTED — AB

## 2021-01-14 MED ORDER — HALOPERIDOL LACTATE 5 MG/ML IJ SOLN: 1.0000 mg | Freq: Three times a day (TID) | INTRAMUSCULAR | Status: DC | PRN

## 2021-01-14 MED ORDER — HALOPERIDOL LACTATE 5 MG/ML IJ SOLN
INTRAMUSCULAR | Status: AC
  Administered 2021-01-14: 1 mg via INTRAVENOUS
  Filled 2021-01-14: qty 1

## 2021-01-14 NOTE — Care Management Important Message (Deleted)
Important Message  Patient Details  Name: Rebecca Huerta MRN: [id number] Date of Birth: 08/11/1945   Medicare Important Message Given:  Yes - Important Message mailed due to current National Emergency     Corey Harold 01/14/2021, 4:16 PM

## 2021-01-14 NOTE — TOC Initial Note (Signed)
Transition of Care [facility]) - Initial/Assessment Note    Patient Details  Name: Rebecca Huerta MRN: [id number] Date of Birth: [birth year]  Transition of Care [facility]) CM/SW Contact:    Annice Needy, LCSW Phone Number: 01/14/2021, 4:21 PM  Clinical Narrative:                 Patient is readmitted after recently being discharged to [facility] for short term rehab. COVID+. Daughter does not want patient to return to [place]. Discussed that the only agency accepting COVID positive patient's was [place] currently. Discussed Medicare star rating of [facility]. Referred to facilities of choice. If patient does not get a bed at [facility] and chooses not to return to [place], home would be her only option.   Expected Discharge Plan: Skilled Nursing Facility Barriers to Discharge: Continued Medical Work up   Patient Goals and CMS Choice     Choice offered to / list presented to : Adult Children, Patient  Expected Discharge Plan and Services Expected Discharge Plan: Skilled Nursing Facility       Living arrangements for the past 2 months: Single Family Home                                      Prior Living Arrangements/Services Living arrangements for the past 2 months: Single Family Home Lives with:: Self, Adult Children Patient language and need for interpreter reviewed:: Yes Do you feel safe going back to the place where you live?: Yes      Need for Family Participation in Patient Care: Yes (Comment) Care giver support system in place?: Yes (comment) Current home services: DME    Activities of Daily Living Home Assistive Devices/Equipment: None ADL Screening (condition at time of admission) Patient's cognitive ability adequate to safely complete daily activities?: No Is the patient deaf or have difficulty hearing?: No Does the patient have difficulty seeing, even when wearing glasses/contacts?: No Does the patient have difficulty concentrating, remembering, or making  decisions?: Yes Patient able to express need for assistance with ADLs?: Yes Does the patient have difficulty dressing or bathing?: Yes Independently performs ADLs?: Yes (appropriate for developmental age) Does the patient have difficulty walking or climbing stairs?: Yes Weakness of Legs: None Weakness of Arms/Hands: None  Permission Sought/Granted Permission sought to share information with : Family Supports    Share Information with NAME: DAughter, Ferd Glassing           Emotional Assessment Appearance:: Appears stated age     Orientation: : Oriented to Self, Oriented to Place Alcohol / Substance Use: Not Applicable Psych Involvement: No (comment)  Admission diagnosis:  Hypoglycemia [E16.2] AKI (acute kidney injury) (HCC) [N17.9] Sepsis secondary to UTI (HCC) [A41.9, N39.0] Lower urinary tract infection, acute [N39.0] Hypothermia, initial encounter [T68.XXXA] Sepsis (HCC) [A41.9] Altered mental status, unspecified altered mental status type [R41.82] Sepsis with acute renal failure without septic shock, due to unspecified organism, unspecified acute renal failure type (HCC) [A41.9, R65.20, N17.9] Patient Active Problem List   Diagnosis Date Noted   Hypothermia 01/12/2021   Sepsis secondary to UTI (HCC) 01/12/2021   Sepsis (HCC) 01/12/2021   Aphasia determined by examination    Right arm weakness    Dementia with behavioral disturbance (HCC)    CHF exacerbation (HCC) 12/31/2020   Acute CHF (congestive heart failure) (HCC) 12/31/2020   Acute respiratory failure with hypoxia (HCC) 12/31/2020   Scalp laceration 04/07/2020   Leukocytosis  04/07/2020   History of CHF (congestive heart failure) 04/07/2020   Lumbar burst fracture (HCC) 04/06/2020   Cardiomyopathy (HCC) 06/09/2014   Chronic systolic heart failure (HCC) 06/09/2014   Malnutrition (HCC) 06/09/2014   Rash 06/09/2014   Anticoagulation adequate 06/09/2014   Pulmonary hypertension (HCC)    Acute on chronic systolic CHF  (congestive heart failure) (HCC) 05/12/2014   Benign essential HTN 05/12/2014   Anxiety state 05/12/2014   Atrial fibrillation with RVR (HCC) 05/12/2014   Postoperative pulmonary edema (HCC) 05/12/2014   Pleural effusion 05/12/2014   Hypokalemia 05/12/2014   Hyponatremia 05/12/2014   A-fib (HCC) 05/11/2014   Edema leg 05/11/2014   Pulmonary edema 05/11/2014   Tobacco abuse 05/11/2014   Alcohol abuse 05/11/2014   PCP:  Charlynne Pander, MD Pharmacy:   [facility] [phone] - [place], [place] - [address] AT [facility] OF [address] [place] [address] Phone: [phone] Fax: [phone]     Social Determinants of Health (SDOH) Interventions    Readmission Risk Interventions Readmission Risk Prevention Plan 01/02/2021  Transportation Screening Complete  PCP or Specialist Appt within 5-7 Days Complete  Home Care Screening Complete  Medication Review (RN CM) Complete  Some recent data might be hidden

## 2021-01-14 NOTE — Progress Notes (Signed)
And in PROGRESS NOTE    Rebecca Huerta  YHC:[id number] DOB: 04-29-1946 DOA: 01/12/2021 PCP: Caprice Renshaw, MD   Chief Complaint  Patient presents with   Altered Mental Status    Brief admission narrative:  As per H&P written by Dr. [place] Pink on 01/12/2021 Rebecca Huerta is a 75 y.o. female with a past medical history of atrial fibrillation on anticoagulation, history of dementia, history of anxiety, history of chronic systolic CHF who was recently hospitalized and discharged on July 27 after being managed for acute systolic and diastolic CHF.  She also has a longstanding history of COPD and was wheezing as well during previous hospitalization.  She was discharged to skilled nursing facility.  According to reports patient was found at the SNF on the bathroom floor playing with her stool.  She was noted to be cool to touch.  Patient appears to be pleasantly confused.  Not much information is available from her.  She however denies any chest pain abdominal pain.  Denies any shortness of breath currently.  History is limited due to her dementia.   In the emergency department she was found to have hypoglycemia.  She was noted to be significantly hypothermic.  Lactic acid level was greater than 11.  UA was noted to be abnormal.  Incidentally she also tested positive for COVID-19 on August 1 in the skilled nursing facility.  Patient was hospitalized for further management.  Assessment & Plan: 1-sepsis secondary to UTI: Present on admission -Patient met criteria on presentation with hypothermia, hepatic encephalopathy, abnormal urinalysis suggesting UTI and elevated lactic acid. -Significant improvement with fluid resuscitation and initiation on empiric antibiotic -Continue supportive care and constant reorientation. -sepsis features essentially resolved at this time -Follow final culture results. -Continue to maintain adequate hydration (with daily weight and strict I's and O's, given history of  CHF).  2-Chronic systolic heart failure ([place]) -Patient with signs of dehydration on physical exam at time of admission. -Most recent ejection fraction 40% from 2D echo done in July 2022 -Continue to follow daily weights, strict I's and O's and closely monitor sodium intake. -Overall CHF condition is stable and compensated.  3-COVID-19 infection -Appears to be incidental -No requiring oxygen supplementation -Chest x-ray without acute infiltrates. -Patient will complete a total of 3 days remdesivir infusion given risk factors -Continue supportive care and symptomatic management.  4-acute kidney injury on chronic kidney disease a stage IIIb -Continue to minimize/avoid nephrotoxic agents-maintain adequate hydration -Follow-up renal function trend and stability   5-hypokalemia -Repleted and within normal limits currently -Continue to follow electrolytes trend intermittently.  6-acute metabolic encephalopathy in the setting of dementia and acute UTI -Continue treatment as mentioned above for UTI. -Continue consult reorientation and supportive care.  7-history of dementia with behavioral disturbance and -Continue Namenda -Continue supportive care and constant reorientation -Continue the use of as needed Xanax and as needed Haldol.  8-permanent atrial fibrillation -Continue carvedilol and the use of Xarelto for secondary prevention. -Overall rate control and is stable.  9-essential hypertension -Stable and well-controlled -Continue current antihypertensive agents. -Continue to follow vital signs.  10-hypoglycemia -Probably in the setting of poor oral intake -On presentation glucose level in the 30s range -No prior history of diabetes and no using any hypoglycemic agents -Continue to encourage oral intake -Continue to follow CBGs intermittently. -Stable since supplementation provided, and so far no further episode of hypoglycemia seen.     DVT prophylaxis: Chronically  anticoagulated on Xarelto. Code Status: DNR/DNI Family Communication: No family at bedside.  Disposition:   Status is: Inpatient  Remains inpatient appropriate because:IV treatments appropriate due to intensity of illness or inability to take PO  Dispo:  Patient From: [place]  Planned Disposition: To be determined  Medically stable for discharge: yes; anticipated medical stability for discharge on 01/15/21       Consultants:  None  Procedures:  See below for x-ray reports.  Antimicrobials:  Rocephin   Subjective: Patient denies chest pain, shortness of breath, nausea, vomiting or abdominal discomfort.  Nursing staff reported intermittent episode of agitation and anxiety.  Improvement in her oral intake appreciated.  Objective: Vitals:   01/13/21 2100 01/14/21 0500 01/14/21 0600 01/14/21 1427  BP: (!) 147/86 (!) 175/84 (!) 161/116 96/70  Pulse: 94  (!) 103   Resp: (!) 30 (!) 30 (!) 22 18  Temp: 99.5 F (37.5 C) 98.5 F (36.9 C)  (!) 96.3 F (35.7 C)  TempSrc:  Oral  Oral  SpO2: 100% 98% 98% 96%  Weight: 58.9 kg     Height: '5\' 4"'  (1.626 m)       Intake/Output Summary (Last 24 hours) at 01/14/2021 1733 Last data filed at 01/14/2021 0500 Gross per 24 hour  Intake 597.96 ml  Output 875 ml  Net -277.04 ml   Filed Weights   01/12/21 0736 01/13/21 2100  Weight: 57 kg 58.9 kg    Examination: General exam: Alert and pleasantly confused; no fever, no chest pain, no nausea, no vomiting.  Reports no shortness of breath or abdominal pain.  In no acute distress currently.  Nursing staff reported intermittent episodes of agitation and anxiety.  Poor insight. Respiratory system: Clear to auscultation. Respiratory effort normal.  No using accessory muscles. Cardiovascular system: Rate controlled; no rubs, no gallops, no JVD. Gastrointestinal system: Abdomen is nondistended, soft and nontender. No organomegaly or masses felt. Normal bowel sounds heard. Central  nervous system: No focal neurological deficits. Extremities: No cyanosis or clubbing. Skin: No petechiae. Psychiatry: Judgement and insight appear impaired in the setting of underlying dementia.  No hallucinations.  Data Reviewed: I have personally reviewed following labs and imaging studies  CBC: Recent Labs  Lab 01/12/21 0705 01/13/21 0506 01/14/21 0339  WBC 5.6 6.9 4.2  NEUTROABS 4.6  --   --   HGB 12.7 10.9* 10.8*  HCT 41.9 36.2 35.8*  MCV 88.0 88.9 89.1  PLT 178 153 142*    Basic Metabolic Panel: Recent Labs  Lab 01/12/21 0705 01/13/21 0506 01/14/21 0339  NA 137 141 140  K 3.0* 3.5 3.5  CL 103 113* 111  CO2 '23 23 24  ' GLUCOSE 292* 91 75  BUN 48* 36* 28*  CREATININE 2.08* 1.39* 0.96  CALCIUM 8.1* 7.7* 7.6*    GFR: Estimated Creatinine Clearance: 43.7 mL/min (by C-G formula based on SCr of 0.96 mg/dL).  Liver Function Tests: Recent Labs  Lab 01/12/21 0705 01/13/21 0506  AST 32 80*  ALT 21 25  ALKPHOS 57 47  BILITOT 0.5 0.4  PROT 6.5 5.5*  ALBUMIN 3.1* 2.6*    CBG: Recent Labs  Lab 01/13/21 1607 01/13/21 1845 01/14/21 0755 01/14/21 1237 01/14/21 1636  GLUCAP 87 77 95 78 91     Recent Results (from the past 240 hour(s))  Urine Culture     Status: Abnormal (Preliminary result)   Collection Time: 01/12/21  7:05 AM   Specimen: Urine, Clean Catch  Result Value Ref Range Status   Specimen Description   Final    URINE, CLEAN CATCH  Performed at [facility], [address], [place], [place] [address]    Special Requests   Final    NONE Performed at [facility], [address] [place], [place] [address]    Culture (A)  Final    >=100,000 COLONIES/mL ESCHERICHIA COLI SUSCEPTIBILITIES TO FOLLOW Performed at [facility] [address], [place], [place] [address]    Report Status PENDING  Incomplete  Resp Panel by RT-PCR (Flu A&B, Covid) Urine, Catheterized     Status: Abnormal   Collection Time: 01/12/21  7:31 AM   Specimen: Urine,  Catheterized; Nasopharyngeal(NP) swabs in vial transport medium  Result Value Ref Range Status   SARS Coronavirus 2 by RT PCR POSITIVE (A) NEGATIVE Final    Comment: CRITICAL RESULT CALLED TO, READ BACK BY AND VERIFIED WITH: LONG,J AT 0935 ON 8.3.22 BY RUCINSKI,B (NOTE) SARS-CoV-2 target nucleic acids are DETECTED.  The SARS-CoV-2 RNA is generally detectable in upper respiratory specimens during the acute phase of infection. Positive results are indicative of the presence of the identified virus, but do not rule out bacterial infection or co-infection with other pathogens not detected by the test. Clinical correlation with patient history and other diagnostic information is necessary to determine patient infection status. The expected result is Negative.  Fact Sheet for Patients: EntrepreneurPulse.com.au  Fact Sheet for Healthcare Providers: IncredibleEmployment.be  This test is not yet approved or cleared by the [place] FDA and  has been authorized for detection and/or diagnosis of SARS-CoV-2 by FDA under an Emergency Use Authorization (EUA).  This EUA will remain in effect (meaning th is test can be used) for the duration of  the COVID-19 declaration under Section 564(b)(1) of the Act, 21 U.S.C. section 360bbb-3(b)(1), unless the authorization is terminated or revoked sooner.     Influenza A by PCR NEGATIVE NEGATIVE Final   Influenza B by PCR NEGATIVE NEGATIVE Final    Comment: (NOTE) The Xpert Xpress SARS-CoV-2/FLU/RSV plus assay is intended as an aid in the diagnosis of influenza from Nasopharyngeal swab specimens and should not be used as a sole basis for treatment. Nasal washings and aspirates are unacceptable for Xpert Xpress SARS-CoV-2/FLU/RSV testing.  Fact Sheet for Patients: EntrepreneurPulse.com.au  Fact Sheet for Healthcare Providers: IncredibleEmployment.be  This test is not yet  approved or cleared by the [place] FDA and has been authorized for detection and/or diagnosis of SARS-CoV-2 by FDA under an Emergency Use Authorization (EUA). This EUA will remain in effect (meaning this test can be used) for the duration of the COVID-19 declaration under Section 564(b)(1) of the Act, 21 U.S.C. section 360bbb-3(b)(1), unless the authorization is terminated or revoked.  Performed at [facility], [address], [place], [place] [address]   Culture, blood (routine x 2)     Status: None (Preliminary result)   Collection Time: 01/12/21  9:08 AM   Specimen: BLOOD RIGHT HAND  Result Value Ref Range Status   Specimen Description BLOOD RIGHT HAND  Final   Special Requests   Final    AEROBIC BOTTLE ONLY Blood Culture results may not be optimal due to an inadequate volume of blood received in culture bottles   Culture   Final    NO GROWTH 2 DAYS Performed at [facility], [address] [place], [place] [address]    Report Status PENDING  Incomplete  Culture, blood (routine x 2)     Status: None (Preliminary result)   Collection Time: 01/12/21  9:09 AM   Specimen: Right  Antecubital; Blood  Result Value Ref Range Status   Specimen Description RIGHT ANTECUBITAL  Final   Special Requests   Final    BOTTLES DRAWN AEROBIC AND ANAEROBIC Blood Culture results may not be optimal due to an excessive volume of blood received in culture bottles   Culture   Final    NO GROWTH 2 DAYS Performed at [facility], [address] [place], [place] [address]    Report Status PENDING  Incomplete  MRSA Next Gen by PCR, Nasal     Status: Abnormal   Collection Time: 01/13/21  9:51 PM   Specimen: Nasal Mucosa; Nasal Swab  Result Value Ref Range Status   MRSA by PCR Next Gen DETECTED (A) NOT DETECTED Final    Comment: RESULT CALLED TO, READ BACK BY AND VERIFIED WITH:  BODEY KINDLEY 8/5 @ 0112 BY S BEARD (NOTE) The GeneXpert MRSA Assay (FDA approved for NASAL specimens only), is one  component of a comprehensive MRSA colonization surveillance program. It is not intended to diagnose MRSA infection nor to guide or monitor treatment for MRSA infections. Test performance is not FDA approved in patients less than 6 years old. Performed at [facility], [address] [place], [place] [address]      Radiology Studies: No results found.   Scheduled Meds:  carvedilol  25 mg Oral BID WC   Chlorhexidine Gluconate Cloth  6 each Topical Daily   memantine  10 mg Oral Daily   QUEtiapine  50 mg Oral QHS   Rivaroxaban  15 mg Oral Q supper   Continuous Infusions:  cefTRIAXone (ROCEPHIN)  IV 1 g (01/14/21 0807)     LOS: 2 days    Time spent: 30 minutes.   Barton Dubois, MD [facility]   To contact the attending provider between 7A-7P or the covering provider during after hours 7P-7A, please log into the web site [email or web address] and access using universal [place] password for that web site. If you do not have the password, please call the hospital operator.  01/14/2021, 5:33 PM

## 2021-01-14 NOTE — Care Management Important Message (Signed)
Important Message  Patient Details  Name: Rebecca Huerta MRN: [id number] Date of Birth: 03/21/1946   Medicare Important Message Given:  Yes - Important Message mailed due to current National Emergency     [name] Solt 01/14/2021, 4:16 PM 

## 2021-01-14 NOTE — NC FL2 (Signed)
[place] MEDICAID FL2 LEVEL OF CARE SCREENING TOOL     IDENTIFICATION  Patient Name: Rebecca Huerta Birthdate: 1945-09-22 Sex: female Admission Date (Current Location): 01/12/2021  [facility] and [place] Number:      Facility and Address:  [facility],  [address] [place] [address]      Provider Number: [id number]  Attending Physician Name and Address:  Vassie Loll, MD  Relative Name and Phone Number:  Baria-Law,[name] (Daughter)   [phone]    Current Level of Care: Hospital Recommended Level of Care:   Prior Approval Number:    Date Approved/Denied: 04/07/20 PASRR Number: [id number]  Discharge Plan: SNF    Current Diagnoses: Patient Active Problem List   Diagnosis Date Noted   Hypothermia 01/12/2021   Sepsis secondary to UTI (HCC) 01/12/2021   Sepsis (HCC) 01/12/2021   Aphasia determined by examination    Right arm weakness    Dementia with behavioral disturbance (HCC)    CHF exacerbation (HCC) 12/31/2020   Acute CHF (congestive heart failure) (HCC) 12/31/2020   Acute respiratory failure with hypoxia (HCC) 12/31/2020   Scalp laceration 04/07/2020   Leukocytosis 04/07/2020   History of CHF (congestive heart failure) 04/07/2020   Lumbar burst fracture (HCC) 04/06/2020   Cardiomyopathy (HCC) 06/09/2014   Chronic systolic heart failure (HCC) 06/09/2014   Malnutrition (HCC) 06/09/2014   Rash 06/09/2014   Anticoagulation adequate 06/09/2014   Pulmonary hypertension (HCC)    Acute on chronic systolic CHF (congestive heart failure) (HCC) 05/12/2014   Benign essential HTN 05/12/2014   Anxiety state 05/12/2014   Atrial fibrillation with RVR (HCC) 05/12/2014   Postoperative pulmonary edema (HCC) 05/12/2014   Pleural effusion 05/12/2014   Hypokalemia 05/12/2014   Hyponatremia 05/12/2014   A-fib (HCC) 05/11/2014   Edema leg 05/11/2014   Pulmonary edema 05/11/2014   Tobacco abuse 05/11/2014   Alcohol abuse 05/11/2014    Orientation  RESPIRATION BLADDER Height & Weight     Self, Place  Normal Continent Weight: 129 lb 13.6 oz (58.9 kg) Height:  5\' 4"  (162.6 cm)  BEHAVIORAL SYMPTOMS/MOOD NEUROLOGICAL BOWEL NUTRITION STATUS      Continent Diet (DYS 3)  AMBULATORY STATUS COMMUNICATION OF NEEDS Skin   Extensive Assist Verbally Normal                       Personal Care Assistance Level of Assistance  Bathing, Feeding, Dressing Bathing Assistance: Maximum assistance Feeding assistance: Independent Dressing Assistance: Limited assistance     Functional Limitations Info  Sight, Hearing, Speech Sight Info: Adequate Hearing Info: Adequate Speech Info: Adequate    SPECIAL CARE FACTORS FREQUENCY  PT (By licensed PT)     PT Frequency: 5x/week              Contractures      Additional Factors Info  Code Status, Allergies, Isolation Precautions Code Status Info: DNR Allergies Info: Penicillins     Isolation Precautions Info: 01/12/21 COVID +     Current Medications (01/14/2021):  This is the current hospital active medication list Current Facility-Administered Medications  Medication Dose Route Frequency Provider Last Rate Last Admin   acetaminophen (TYLENOL) tablet 650 mg  650 mg Oral Q6H PRN 03/16/2021, MD       Or   acetaminophen (TYLENOL) suppository 650 mg  650 mg Rectal Q6H PRN Osvaldo Shipper, MD       ALPRAZolam Osvaldo Shipper) tablet 0.25 mg  0.25 mg Oral TID PRN Prudy Feeler, MD  carvedilol (COREG) tablet 25 mg  25 mg Oral BID WC Osvaldo Shipper, MD   25 mg at 01/14/21 0805   cefTRIAXone (ROCEPHIN) 1 g in sodium chloride 0.9 % 100 mL IVPB  1 g Intravenous Q24H Osvaldo Shipper, MD 200 mL/hr at 01/14/21 0807 1 g at 01/14/21 6812   Chlorhexidine Gluconate Cloth 2 % PADS 6 each  6 each Topical Daily Bobette Mo, MD       Ipratropium-Albuterol (COMBIVENT) respimat 1 puff  1 puff Inhalation Q6H PRN Vassie Loll, MD       memantine [facility]) tablet 10 mg  10 mg Oral Daily Osvaldo Shipper, MD   10 mg at 01/14/21 0805   ondansetron (ZOFRAN) tablet 4 mg  4 mg Oral Q6H PRN Osvaldo Shipper, MD       Or   ondansetron [facility]) injection 4 mg  4 mg Intravenous Q6H PRN Osvaldo Shipper, MD       polyethylene glycol (MIRALAX / GLYCOLAX) packet 17 g  17 g Oral Daily PRN Osvaldo Shipper, MD       QUEtiapine (SEROQUEL) tablet 50 mg  50 mg Oral QHS Osvaldo Shipper, MD   50 mg at 01/12/21 2255   remdesivir 100 mg in sodium chloride 0.9 % 100 mL IVPB  100 mg Intravenous Daily Vassie Loll, MD   Stopped at 01/13/21 1008   Rivaroxaban (XARELTO) tablet 15 mg  15 mg Oral Q supper Osvaldo Shipper, MD   15 mg at 01/13/21 1809     Discharge Medications: Please see discharge summary for a list of discharge medications.  Relevant Imaging Results:  Relevant Lab Results:   Additional Information PT SSN: [id number]  Annice Needy, LCSW

## 2021-01-14 NOTE — Plan of Care (Signed)
°  Problem: Education: °Goal: Knowledge of General Education information will improve °Description: Including pain rating scale, medication(s)/side effects and non-pharmacologic comfort measures °Outcome: Not Progressing °  °Problem: Health Behavior/Discharge Planning: °Goal: Ability to manage health-related needs will improve °Outcome: Not Progressing °  °Problem: Clinical Measurements: °Goal: Ability to maintain clinical measurements within normal limits will improve °Outcome: Progressing °Goal: Will remain free from infection °Outcome: Progressing °Goal: Diagnostic test results will improve °Outcome: Progressing °Goal: Respiratory complications will improve °Outcome: Progressing °Goal: Cardiovascular complication will be avoided °Outcome: Progressing °  °Problem: Activity: °Goal: Risk for activity intolerance will decrease °Outcome: Not Progressing °  °Problem: Nutrition: °Goal: Adequate nutrition will be maintained °Outcome: Not Progressing °  °Problem: Coping: °Goal: Level of anxiety will decrease °Outcome: Not Progressing °  °Problem: Elimination: °Goal: Will not experience complications related to bowel motility °Outcome: Progressing °Goal: Will not experience complications related to urinary retention °Outcome: Progressing °  °Problem: Pain Managment: °Goal: General experience of comfort will improve °Outcome: Progressing °  °Problem: Safety: °Goal: Ability to remain free from injury will improve °Outcome: Progressing °  °Problem: Skin Integrity: °Goal: Risk for impaired skin integrity will decrease °Outcome: Progressing °  °

## 2021-01-14 NOTE — Progress Notes (Signed)
Patient very forgetful this am but calm and cooperative with cares. Per daughter, patient does have dementia and is often needing redirection.   At around 1630 patient became increasingly agitated, and was not redirectable. She pulled off all of her telemetry and pulled her iv out of her right forearm. She got up and told this RN she needed to get her clothes on because she was leaving. MD notified of episode.   Haldol was ordered and given per eMar instructions. Patient started to calm down and become more cooperative with cares. She agreed to take PO medications, so xanax prn was administered.   Foley catheter was removed this afternoon around 1145 am. Patient offered toileting every 2 hours, but patient has refused stating she doesn't need to pee.   Patient remains on room air, vitals stable. Assisted back to bed and alarm on.

## 2021-01-15 LAB — URINE CULTURE: Culture: >=100000 — AB

## 2021-01-15 LAB — GLUCOSE, CAPILLARY: Glucose-Capillary: 82 mg/dL (ref 70–99)

## 2021-01-15 LAB — C-REACTIVE PROTEIN: CRP: 4.4 mg/dL — ABNORMAL HIGH (ref <1.0)

## 2021-01-15 MED ORDER — CEFDINIR 300 MG PO CAPS
300.0000 mg | ORAL_CAPSULE | Freq: Two times a day (BID) | ORAL | Status: DC
  Administered 2021-01-15 – 2021-01-18 (×6): 300 mg via ORAL
  Filled 2021-01-15 (×7): qty 1

## 2021-01-15 NOTE — Progress Notes (Signed)
Patient pulled her IV out in left [facility]  Took off all of her telemetry leads. Attempted to replace- patient got irritable and agitated and saying " NO, you don't want me to cry do you"   This RN offered reassuring presence and comfort. Left patient safe in chair with chair alarm on. Patient was agreeable to take am meds, so a prn xanax was administered for patient's increased anxiety and agitation.

## 2021-01-15 NOTE — Progress Notes (Signed)
And in PROGRESS NOTE    Rebecca Huerta  IFO:[id number] DOB: Oct 26, 1945 DOA: 01/12/2021 PCP: Caprice Renshaw, MD   Chief Complaint  Patient presents with   Altered Mental Status    Brief admission narrative:  As per H&P written by Dr. [place] Pink on 01/12/2021 Rebecca Huerta is a 75 y.o. female with a past medical history of atrial fibrillation on anticoagulation, history of dementia, history of anxiety, history of chronic systolic CHF who was recently hospitalized and discharged on July 27 after being managed for acute systolic and diastolic CHF.  She also has a longstanding history of COPD and was wheezing as well during previous hospitalization.  She was discharged to skilled nursing facility.  According to reports patient was found at the SNF on the bathroom floor playing with her stool.  She was noted to be cool to touch.  Patient appears to be pleasantly confused.  Not much information is available from her.  She however denies any chest pain abdominal pain.  Denies any shortness of breath currently.  History is limited due to her dementia.   In the emergency department she was found to have hypoglycemia.  She was noted to be significantly hypothermic.  Lactic acid level was greater than 11.  UA was noted to be abnormal.  Incidentally she also tested positive for COVID-19 on August 1 in the skilled nursing facility.  Patient was hospitalized for further management.  Assessment & Plan: 1-sepsis secondary to E. coli UTI: Present on admission -Patient met criteria on presentation with hypothermia, hepatic encephalopathy, abnormal urinalysis suggesting UTI and elevated lactic acid. -Significant improvement with fluid resuscitation and initiation on empiric antibiotic -Continue supportive care and constant reorientation. -sepsis features essentially resolved at this time -More than 100,000 colonies of E. coli; pansensitive. -Antibiotic transition to cefdinir twice a day following sensitivity. -Continue to  maintain adequate hydration (with daily weight and strict I's and O's, given history of CHF).  2-Chronic systolic heart failure ([place]) -Patient with signs of dehydration on physical exam at time of admission. -Most recent ejection fraction 40% from 2D echo done in July 2022 -Continue to follow daily weights, strict I's and O's and closely monitor sodium intake. -Overall CHF condition is stable and compensated.  3-COVID-19 infection -Appears to be incidental -No requiring oxygen supplementation -Chest x-ray without acute infiltrates. -Patient will complete a total of 3 days remdesivir infusion given risk factors -Continue supportive care and symptomatic management.  4-acute kidney injury on chronic kidney disease a stage IIIb -Continue to minimize/avoid nephrotoxic agents -Continue to maintain adequate hydration -Follow-up renal function trend and stability  -Renal function currently back to baseline.  5-hypokalemia -Repleted and within normal limits currently -Continue to follow electrolytes trend intermittently. -We will discontinue telemetry.  6-acute metabolic encephalopathy in the setting of dementia and acute UTI -Continue treatment as mentioned above for UTI. -Continue consult reorientation and supportive care.  7-history of dementia with behavioral disturbance and -Continue Namenda -Continue supportive care and constant reorientation -Continue the use of as needed Xanax and as needed Haldol.  8-permanent atrial fibrillation -Continue carvedilol and the use of Xarelto for secondary prevention. -Overall rate controlled and stable.  9-essential hypertension -Stable and well-controlled -Continue current antihypertensive agents. -Continue to follow vital signs.  10-hypoglycemia -Probably in the setting of poor oral intake -On presentation glucose level in the 30s range -No prior history of diabetes and no using any hypoglycemic agents -Continue to encourage oral  intake -No need for consult CBGs monitoring any longer.  Patient  has not been experiencing further hypoglycemic events. -Stable since supplementation provided, and so far no further episode of hypoglycemia seen.     DVT prophylaxis: Chronically anticoagulated on Xarelto. Code Status: DNR/DNI Family Communication: Daughter updated over the phone 01/14/2021. Disposition:   Status is: Inpatient  Remains inpatient appropriate because:IV treatments appropriate due to intensity of illness or inability to take PO  Dispo:  Patient From: [place]  Planned Disposition: To be determined  Medically stable for discharge: yes, patient is medically stable for discharge.  Waiting final evaluation by skilled nursing facility [facility]) to finalize approval.       Consultants:  None  Procedures:  See below for x-ray reports.  Antimicrobials:  Rocephin   Subjective: No fever, no chest pain, no nausea, no vomiting.  Pleasantly confused.  Objective: Vitals:   01/15/21 0900 01/15/21 0909 01/15/21 0916 01/15/21 1600  BP: 133/88  133/88 (!) 150/100  Pulse: (!) 112   100  Resp: (!) 24 (!) 24  19  Temp: 98.1 F (36.7 C)   (!) 96.9 F (36.1 C)  TempSrc: Axillary   Axillary  SpO2: 96%   96%  Weight:      Height:        Intake/Output Summary (Last 24 hours) at 01/15/2021 1710 Last data filed at 01/15/2021 1600 Gross per 24 hour  Intake 100 ml  Output 650 ml  Net -550 ml   Filed Weights   01/12/21 0736 01/13/21 2100  Weight: 57 kg 58.9 kg    Examination: General exam: In no acute distress; pleasantly confused.  Denies chest pain, no nausea, no vomiting.  Patient is afebrile. Respiratory system: Clear to auscultation. Respiratory effort normal.  No using accessory muscle.  Good saturation on room air. Cardiovascular system: Rate controlled, no rubs, no gallops, no JVD. Gastrointestinal system: Abdomen is nondistended, soft and nontender. No organomegaly or masses felt.  Normal bowel sounds heard. Central nervous system:  No focal neurological deficits. Extremities: No cyanosis or clubbing. Skin: No petechiae. Psychiatry: Per nursing staff patient continue experiencing intermittent episode of agitations.   Data Reviewed: I have personally reviewed following labs and imaging studies  CBC: Recent Labs  Lab 01/12/21 0705 01/13/21 0506 01/14/21 0339  WBC 5.6 6.9 4.2  NEUTROABS 4.6  --   --   HGB 12.7 10.9* 10.8*  HCT 41.9 36.2 35.8*  MCV 88.0 88.9 89.1  PLT 178 153 142*    Basic Metabolic Panel: Recent Labs  Lab 01/12/21 0705 01/13/21 0506 01/14/21 0339  NA 137 141 140  K 3.0* 3.5 3.5  CL 103 113* 111  CO2 '23 23 24  ' GLUCOSE 292* 91 75  BUN 48* 36* 28*  CREATININE 2.08* 1.39* 0.96  CALCIUM 8.1* 7.7* 7.6*    GFR: Estimated Creatinine Clearance: 43.7 mL/min (by C-G formula based on SCr of 0.96 mg/dL).  Liver Function Tests: Recent Labs  Lab 01/12/21 0705 01/13/21 0506  AST 32 80*  ALT 21 25  ALKPHOS 57 47  BILITOT 0.5 0.4  PROT 6.5 5.5*  ALBUMIN 3.1* 2.6*    CBG: Recent Labs  Lab 01/13/21 1845 01/14/21 0755 01/14/21 1237 01/14/21 1636 01/15/21 0756  GLUCAP 77 95 78 91 82     Recent Results (from the past 240 hour(s))  Urine Culture     Status: Abnormal   Collection Time: 01/12/21  7:05 AM   Specimen: Urine, Clean Catch  Result Value Ref Range Status   Specimen Description   Final  URINE, CLEAN CATCH Performed at [facility], [address], [place], [place] [address]    Special Requests   Final    NONE Performed at [facility], [address], [place], [place] [address]    Culture >=100,000 COLONIES/mL ESCHERICHIA COLI (A)  Final   Report Status 01/15/2021 FINAL  Final   Organism ID, Bacteria ESCHERICHIA COLI (A)  Final      Susceptibility   Escherichia coli - MIC*    AMPICILLIN 4 SENSITIVE Sensitive     CEFAZOLIN <=4 SENSITIVE Sensitive     CEFEPIME <=0.12 SENSITIVE Sensitive     CEFTRIAXONE <=0.25  SENSITIVE Sensitive     CIPROFLOXACIN <=0.25 SENSITIVE Sensitive     GENTAMICIN <=1 SENSITIVE Sensitive     IMIPENEM <=0.25 SENSITIVE Sensitive     NITROFURANTOIN <=16 SENSITIVE Sensitive     TRIMETH/SULFA <=20 SENSITIVE Sensitive     AMPICILLIN/SULBACTAM <=2 SENSITIVE Sensitive     PIP/TAZO <=4 SENSITIVE Sensitive     * >=100,000 COLONIES/mL ESCHERICHIA COLI  Resp Panel by RT-PCR (Flu A&B, Covid) Urine, Catheterized     Status: Abnormal   Collection Time: 01/12/21  7:31 AM   Specimen: Urine, Catheterized; Nasopharyngeal(NP) swabs in vial transport medium  Result Value Ref Range Status   SARS Coronavirus 2 by RT PCR POSITIVE (A) NEGATIVE Final    Comment: CRITICAL RESULT CALLED TO, READ BACK BY AND VERIFIED WITH: LONG,J AT 0935 ON 8.3.22 BY RUCINSKI,B (NOTE) SARS-CoV-2 target nucleic acids are DETECTED.  The SARS-CoV-2 RNA is generally detectable in upper respiratory specimens during the acute phase of infection. Positive results are indicative of the presence of the identified virus, but do not rule out bacterial infection or co-infection with other pathogens not detected by the test. Clinical correlation with patient history and other diagnostic information is necessary to determine patient infection status. The expected result is Negative.  Fact Sheet for Patients: EntrepreneurPulse.com.au  Fact Sheet for Healthcare Providers: IncredibleEmployment.be  This test is not yet approved or cleared by the [place] FDA and  has been authorized for detection and/or diagnosis of SARS-CoV-2 by FDA under an Emergency Use Authorization (EUA).  This EUA will remain in effect (meaning th is test can be used) for the duration of  the COVID-19 declaration under Section 564(b)(1) of the Act, 21 U.S.C. section 360bbb-3(b)(1), unless the authorization is terminated or revoked sooner.     Influenza A by PCR NEGATIVE NEGATIVE Final   Influenza B by PCR  NEGATIVE NEGATIVE Final    Comment: (NOTE) The Xpert Xpress SARS-CoV-2/FLU/RSV plus assay is intended as an aid in the diagnosis of influenza from Nasopharyngeal swab specimens and should not be used as a sole basis for treatment. Nasal washings and aspirates are unacceptable for Xpert Xpress SARS-CoV-2/FLU/RSV testing.  Fact Sheet for Patients: EntrepreneurPulse.com.au  Fact Sheet for Healthcare Providers: IncredibleEmployment.be  This test is not yet approved or cleared by the [place] FDA and has been authorized for detection and/or diagnosis of SARS-CoV-2 by FDA under an Emergency Use Authorization (EUA). This EUA will remain in effect (meaning this test can be used) for the duration of the COVID-19 declaration under Section 564(b)(1) of the Act, 21 U.S.C. section 360bbb-3(b)(1), unless the authorization is terminated or revoked.  Performed at [facility], [address], [place], [place] [address]   Culture, blood (routine x 2)     Status: None (Preliminary result)   Collection Time: 01/12/21  9:08 AM   Specimen: BLOOD RIGHT HAND  Result  Value Ref Range Status   Specimen Description BLOOD RIGHT HAND  Final   Special Requests   Final    AEROBIC BOTTLE ONLY Blood Culture results may not be optimal due to an inadequate volume of blood received in culture bottles   Culture   Final    NO GROWTH 3 DAYS Performed at [facility], [address], [place], [place] [address]    Report Status PENDING  Incomplete  Culture, blood (routine x 2)     Status: None (Preliminary result)   Collection Time: 01/12/21  9:09 AM   Specimen: Right Antecubital; Blood  Result Value Ref Range Status   Specimen Description RIGHT ANTECUBITAL  Final   Special Requests   Final    BOTTLES DRAWN AEROBIC AND ANAEROBIC Blood Culture results may not be optimal due to an excessive volume of blood received in culture bottles   Culture   Final    NO GROWTH 3  DAYS Performed at [facility], [address], [place], [place] [address]    Report Status PENDING  Incomplete  MRSA Next Gen by PCR, Nasal     Status: Abnormal   Collection Time: 01/13/21  9:51 PM   Specimen: Nasal Mucosa; Nasal Swab  Result Value Ref Range Status   MRSA by PCR Next Gen DETECTED (A) NOT DETECTED Final    Comment: RESULT CALLED TO, READ BACK BY AND VERIFIED WITH:  BODEY KINDLEY 8/5 @ 0112 BY S BEARD (NOTE) The GeneXpert MRSA Assay (FDA approved for NASAL specimens only), is one component of a comprehensive MRSA colonization surveillance program. It is not intended to diagnose MRSA infection nor to guide or monitor treatment for MRSA infections. Test performance is not FDA approved in patients less than 13 years old. Performed at [facility], [address] [place], [place] [address]      Radiology Studies: No results found.   Scheduled Meds:  carvedilol  25 mg Oral BID WC   Chlorhexidine Gluconate Cloth  6 each Topical Daily   memantine  10 mg Oral Daily   QUEtiapine  50 mg Oral QHS   Rivaroxaban  15 mg Oral Q supper   Continuous Infusions:  cefTRIAXone (ROCEPHIN)  IV Stopped (01/14/21 0837)     LOS: 3 days    Time spent: 30 minutes.   Barton Dubois, MD [facility]   To contact the attending provider between 7A-7P or the covering provider during after hours 7P-7A, please log into the web site [email or web address] and access using universal [place] password for that web site. If you do not have the password, please call the hospital operator.  01/15/2021, 5:10 PM

## 2021-01-15 NOTE — TOC Progression Note (Signed)
Transition of Care [facility]) - Progression Note    Patient Details  Name: Rebecca Huerta MRN: [id number] Date of Birth: 1946-01-07  Transition of Care [facility]) CM/SW Contact  Barry Brunner, LCSW Phone Number: 01/15/2021, 3:46 PM  Clinical Narrative:    CSW contacted [facility] to follow up on referral. [facility] reported that they did have Covid beds available, but Admissions would not be in until Monday to review patient. MD notified. TOC to follow.    Expected Discharge Plan: Skilled Nursing Facility Barriers to Discharge: Continued Medical Work up  Expected Discharge Plan and Services Expected Discharge Plan: Skilled Nursing Facility       Living arrangements for the past 2 months: Single Family Home                                       Social Determinants of Health (SDOH) Interventions    Readmission Risk Interventions Readmission Risk Prevention Plan 01/02/2021  Transportation Screening Complete  PCP or Specialist Appt within 5-7 Days Complete  Home Care Screening Complete  Medication Review (RN CM) Complete  Some recent data might be hidden

## 2021-01-16 LAB — C-REACTIVE PROTEIN: CRP: 3.7 mg/dL — ABNORMAL HIGH (ref <1.0)

## 2021-01-16 NOTE — Progress Notes (Signed)
Patient asked for some applesauce. I was able to mix her meds in with applesauce for her to take. She would not allow me to scan her bracelet.

## 2021-01-16 NOTE — Progress Notes (Signed)
Patient is very confused this evening, thinks she is on a boat. At this time is refusing to allow me to do any assessments or bring in medications for her.   Will attempt again later in the evening.

## 2021-01-16 NOTE — Progress Notes (Signed)
And in PROGRESS NOTE    Rebecca Huerta  GTX:[id number] DOB: 1945/06/22 DOA: 01/12/2021 PCP: Caprice Renshaw, MD   Chief Complaint  Patient presents with   Altered Mental Status    Brief admission narrative:  As per H&P written by Dr. Maryland Pink on 01/12/2021 Rebecca Huerta is a 75 y.o. female with a past medical history of atrial fibrillation on anticoagulation, history of dementia, history of anxiety, history of chronic systolic CHF who was recently hospitalized and discharged on July 27 after being managed for acute systolic and diastolic CHF.  She also has a longstanding history of COPD and was wheezing as well during previous hospitalization.  She was discharged to skilled nursing facility.  According to reports patient was found at the SNF on the bathroom floor playing with her stool.  She was noted to be cool to touch.  Patient appears to be pleasantly confused.  Not much information is available from her.  She however denies any chest pain abdominal pain.  Denies any shortness of breath currently.  History is limited due to her dementia.   In the emergency department she was found to have hypoglycemia.  She was noted to be significantly hypothermic.  Lactic acid level was greater than 11.  UA was noted to be abnormal.  Incidentally she also tested positive for COVID-19 on August 1 in the skilled nursing facility.  Patient was hospitalized for further management.  Assessment & Plan: 1-sepsis secondary to E. coli UTI: Present on admission -Patient met criteria on presentation with hypothermia, hepatic encephalopathy, abnormal urinalysis suggesting UTI and elevated lactic acid. -Significant improvement with fluid resuscitation and initiation on empiric antibiotic -Continue supportive care and constant reorientation. -sepsis features essentially resolved at this time -More than 100,000 colonies of E. coli; pansensitive. -Antibiotic transitioned to cefdinir twice a day following sensitivity; so far  well-tolerated. -Continue to maintain adequate hydration (with daily weight and strict I's and O's, given history of CHF).  2-Chronic systolic heart failure ([place]) -Patient with signs of dehydration on physical exam at time of admission. -Most recent ejection fraction 40% from 2D echo done in July 2022 -Continue to follow daily weights, strict I's and O's and closely monitor sodium intake. -Overall CHF condition is stable and compensated.  3-COVID-19 infection -Appears to be incidental -No requiring oxygen supplementation -Chest x-ray without acute infiltrates. -Patient will complete a total of 3 days remdesivir infusion given risk factors -Continue supportive care and symptomatic management.  4-acute kidney injury on chronic kidney disease a stage IIIb -Continue to minimize/avoid nephrotoxic agents -Continue to maintain adequate hydration -Follow-up renal function trend and stability  -Renal function currently back to baseline.  5-hypokalemia -Repleted and within normal limits currently -Continue to follow electrolytes trend intermittently. -We will discontinue telemetry.  6-acute metabolic encephalopathy in the setting of dementia and acute UTI -Continue treatment as mentioned above for UTI. -Continue consult reorientation and supportive care.  7-history of dementia with behavioral disturbance and -Continue Namenda -Continue supportive care and constant reorientation -Continue the use of as needed Xanax and as needed Haldol.  8-permanent atrial fibrillation -Continue carvedilol and the use of Xarelto for secondary prevention. -Overall rate controlled and stable.  9-essential hypertension -Stable and well-controlled -Continue current antihypertensive agents. -Continue to follow vital signs.  10-hypoglycemia -Probably in the setting of poor oral intake -On presentation glucose level in the 30s range -No prior history of diabetes and no using any hypoglycemic  agents -Continue to encourage oral intake -No need for consult CBGs monitoring any  longer.  Patient has not been experiencing further hypoglycemic events. -Stable since supplementation provided, and so far no further episode of hypoglycemia seen.     DVT prophylaxis: Chronically anticoagulated on Xarelto. Code Status: DNR/DNI Family Communication: Daughter updated over the phone 01/14/2021. Disposition:   Status is: Inpatient  Remains inpatient appropriate because:IV treatments appropriate due to intensity of illness or inability to take PO  Dispo:  Patient From: [facility]  Planned Disposition: To be determined  Medically stable for discharge: yes, patient is medically stable for discharge.  Waiting final evaluation by skilled nursing facility [facility]) to finalize approval.       Consultants:  None  Procedures:  See below for x-ray reports.  Antimicrobials:  Rocephin  Cefdinir.  Subjective: No fever, no chest pain, no nausea, no vomiting.  Pleasantly confused.  In no acute distress.  Patient is weak, frail and deconditioned on examination.  Objective: Vitals:   01/16/21 0400 01/16/21 0700 01/16/21 0800 01/16/21 0846  BP: (!) 165/98 (!) 172/105 (!) 175/109 (!) 145/93  Pulse: 91 88 86 84  Resp: 20   20  Temp: (!) 97.2 F (36.2 C)   98 F (36.7 C)  TempSrc: Axillary   Oral  SpO2: 100% 100% 100% 100%  Weight:      Height:        Intake/Output Summary (Last 24 hours) at 01/16/2021 1643 Last data filed at 01/16/2021 1500 Gross per 24 hour  Intake 480 ml  Output --  Net 480 ml   Filed Weights   01/12/21 0736 01/13/21 2100  Weight: 57 kg 58.9 kg    Examination: General exam: Alert, awake, and following commands.  Patient is pleasantly confused.  No chest pain, no nausea, no vomiting.  Denies shortness of breath and demonstrate good saturation on room air. Respiratory system: Good air movement bilaterally; no using accessory muscle.  Good saturation  on room air. Cardiovascular system: Rate controlled, no rubs, no gallops, no JVD. Gastrointestinal system: Abdomen is nondistended, soft and nontender. No organomegaly or masses felt. Normal bowel sounds heard. Central nervous system: No focal neurological deficits. Extremities: No cyanosis or clubbing. Skin: No petechiae. Psychiatry: Judgement and insight appear impaired in the setting of underlying dementia; no hallucinations currently.  Mood stable during evaluation.   Data Reviewed: I have personally reviewed following labs and imaging studies  CBC: Recent Labs  Lab 01/12/21 0705 01/13/21 0506 01/14/21 0339  WBC 5.6 6.9 4.2  NEUTROABS 4.6  --   --   HGB 12.7 10.9* 10.8*  HCT 41.9 36.2 35.8*  MCV 88.0 88.9 89.1  PLT 178 153 142*    Basic Metabolic Panel: Recent Labs  Lab 01/12/21 0705 01/13/21 0506 01/14/21 0339  NA 137 141 140  K 3.0* 3.5 3.5  CL 103 113* 111  CO2 _0 GLUCOSE 292* 91 75  BUN 48* 36* 28*  CREATININE 2.08* 1.39* 0.96  CALCIUM 8.1* 7.7* 7.6*    GFR: Estimated Creatinine Clearance: 43.7 mL/min (by C-G formula based on SCr of 0.96 mg/dL).  Liver Function Tests: Recent Labs  Lab 01/12/21 0705 01/13/21 0506  AST 32 80*  ALT 21 25  ALKPHOS 57 47  BILITOT 0.5 0.4  PROT 6.5 5.5*  ALBUMIN 3.1* 2.6*    CBG: Recent Labs  Lab 01/13/21 1845 01/14/21 0755 01/14/21 1237 01/14/21 1636 01/15/21 0756  GLUCAP 77 95 78 91 82     Recent Results (from the past 240 hour(s))  Urine Culture  Status: Abnormal   Collection Time: 01/12/21  7:05 AM   Specimen: Urine, Clean Catch  Result Value Ref Range Status   Specimen Description   Final    URINE, CLEAN CATCH Performed at [facility], [address] [place] [address] [place], [place] [address]    Special Requests   Final    NONE Performed at [facility], [address], [place], [place] [address]    Culture >=100,000 COLONIES/mL ESCHERICHIA COLI (A)  Final   Report Status 01/15/2021 FINAL  Final    Organism ID, Bacteria ESCHERICHIA COLI (A)  Final      Susceptibility   Escherichia coli - MIC*    AMPICILLIN 4 SENSITIVE Sensitive     CEFAZOLIN <=4 SENSITIVE Sensitive     CEFEPIME <=0.12 SENSITIVE Sensitive     CEFTRIAXONE <=0.25 SENSITIVE Sensitive     CIPROFLOXACIN <=0.25 SENSITIVE Sensitive     GENTAMICIN <=1 SENSITIVE Sensitive     IMIPENEM <=0.25 SENSITIVE Sensitive     NITROFURANTOIN <=16 SENSITIVE Sensitive     TRIMETH/SULFA <=20 SENSITIVE Sensitive     AMPICILLIN/SULBACTAM <=2 SENSITIVE Sensitive     PIP/TAZO <=4 SENSITIVE Sensitive     * >=100,000 COLONIES/mL ESCHERICHIA COLI  Resp Panel by RT-PCR (Flu A&B, Covid) Urine, Catheterized     Status: Abnormal   Collection Time: 01/12/21  7:31 AM   Specimen: Urine, Catheterized; Nasopharyngeal(NP) swabs in vial transport medium  Result Value Ref Range Status   SARS Coronavirus 2 by RT PCR POSITIVE (A) NEGATIVE Final    Comment: CRITICAL RESULT CALLED TO, READ BACK BY AND VERIFIED WITH: LONG,J AT 0935 ON 8.3.22 BY RUCINSKI,B (NOTE) SARS-CoV-2 target nucleic acids are DETECTED.  The SARS-CoV-2 RNA is generally detectable in upper respiratory specimens during the acute phase of infection. Positive results are indicative of the presence of the identified virus, but do not rule out bacterial infection or co-infection with other pathogens not detected by the test. Clinical correlation with patient history and other diagnostic information is necessary to determine patient infection status. The expected result is Negative.  Fact Sheet for Patients: EntrepreneurPulse.com.au  Fact Sheet for Healthcare Providers: IncredibleEmployment.be  This test is not yet approved or cleared by the [place] FDA and  has been authorized for detection and/or diagnosis of SARS-CoV-2 by FDA under an Emergency Use Authorization (EUA).  This EUA will remain in effect (meaning th is test can be used) for the  duration of  the COVID-19 declaration under Section 564(b)(1) of the Act, 21 U.S.C. section 360bbb-3(b)(1), unless the authorization is terminated or revoked sooner.     Influenza A by PCR NEGATIVE NEGATIVE Final   Influenza B by PCR NEGATIVE NEGATIVE Final    Comment: (NOTE) The Xpert Xpress SARS-CoV-2/FLU/RSV plus assay is intended as an aid in the diagnosis of influenza from Nasopharyngeal swab specimens and should not be used as a sole basis for treatment. Nasal washings and aspirates are unacceptable for Xpert Xpress SARS-CoV-2/FLU/RSV testing.  Fact Sheet for Patients: EntrepreneurPulse.com.au  Fact Sheet for Healthcare Providers: IncredibleEmployment.be  This test is not yet approved or cleared by the [place] FDA and has been authorized for detection and/or diagnosis of SARS-CoV-2 by FDA under an Emergency Use Authorization (EUA). This EUA will remain in effect (meaning this test can be used) for the duration of the COVID-19 declaration under Section 564(b)(1) of the Act, 21 U.S.C. section 360bbb-3(b)(1), unless the authorization is terminated or revoked.  Performed at [facility], [address] [place], [place]  [address]   Culture, blood (routine x 2)     Status: None (Preliminary result)   Collection Time: 01/12/21  9:08 AM   Specimen: BLOOD RIGHT HAND  Result Value Ref Range Status   Specimen Description BLOOD RIGHT HAND  Final   Special Requests   Final    AEROBIC BOTTLE ONLY Blood Culture results may not be optimal due to an inadequate volume of blood received in culture bottles   Culture   Final    NO GROWTH 4 DAYS Performed at [facility], [address], [place], [place] [address]    Report Status PENDING  Incomplete  Culture, blood (routine x 2)     Status: None (Preliminary result)   Collection Time: 01/12/21  9:09 AM   Specimen: Right Antecubital; Blood  Result Value Ref Range Status   Specimen Description  RIGHT ANTECUBITAL  Final   Special Requests   Final    BOTTLES DRAWN AEROBIC AND ANAEROBIC Blood Culture results may not be optimal due to an excessive volume of blood received in culture bottles   Culture   Final    NO GROWTH 4 DAYS Performed at [facility], [address], [place], [place] [address]    Report Status PENDING  Incomplete  MRSA Next Gen by PCR, Nasal     Status: Abnormal   Collection Time: 01/13/21  9:51 PM   Specimen: Nasal Mucosa; Nasal Swab  Result Value Ref Range Status   MRSA by PCR Next Gen DETECTED (A) NOT DETECTED Final    Comment: RESULT CALLED TO, READ BACK BY AND VERIFIED WITH:  BODEY KINDLEY 8/5 @ 0112 BY S BEARD (NOTE) The GeneXpert MRSA Assay (FDA approved for NASAL specimens only), is one component of a comprehensive MRSA colonization surveillance program. It is not intended to diagnose MRSA infection nor to guide or monitor treatment for MRSA infections. Test performance is not FDA approved in patients less than 81 years old. Performed at [facility], [address], [place],  [address]      Radiology Studies: No results found.   Scheduled Meds:  carvedilol  25 mg Oral BID WC   cefdinir  300 mg Oral Q12H   Chlorhexidine Gluconate Cloth  6 each Topical Daily   memantine  10 mg Oral Daily   QUEtiapine  50 mg Oral QHS   Rivaroxaban  15 mg Oral Q supper   Continuous Infusions:     LOS: 4 days    Time spent: 30 minutes.   Barton Dubois, MD [facility]   To contact the attending provider between 7A-7P or the covering provider during after hours 7P-7A, please log into the web site [email or web address] and access using universal [place] password for that web site. If you do not have the password, please call the hospital operator.  01/16/2021, 4:43 PM

## 2021-01-17 ENCOUNTER — Ambulatory Visit: Payer: PPO | Admitting: Cardiology

## 2021-01-17 LAB — C-REACTIVE PROTEIN: CRP: 2.4 mg/dL — ABNORMAL HIGH (ref <1.0)

## 2021-01-17 MED ORDER — MUPIROCIN 2 % EX OINT
1.0000 "application " | TOPICAL_OINTMENT | Freq: Two times a day (BID) | CUTANEOUS | Status: DC
  Administered 2021-01-17 – 2021-01-18 (×3): 1 via NASAL
  Filled 2021-01-17: qty 22

## 2021-01-17 NOTE — TOC Progression Note (Signed)
Transition of Care [facility]) - Progression Note    Patient Details  Name: Rebecca Huerta MRN: [id number] Date of Birth: [birth year]  Transition of Care [facility]) CM/SW Contact  Barry Brunner, LCSW Phone Number: 01/17/2021, 3:49 PM  Clinical Narrative:    CSW was able to reach [facility] with [place].Georgeann Oppenheim agreeable to review patient. HTA reported that patient's auth would have to go to the medical director for review due to patient's readmission. TOC to follow.   Expected Discharge Plan: Skilled Nursing Facility Barriers to Discharge: Continued Medical Work up  Expected Discharge Plan and Services Expected Discharge Plan: Skilled Nursing Facility       Living arrangements for the past 2 months: Single Family Home                                       Social Determinants of Health (SDOH) Interventions    Readmission Risk Interventions Readmission Risk Prevention Plan 01/02/2021  Transportation Screening Complete  PCP or Specialist Appt within 5-7 Days Complete  Home Care Screening Complete  Medication Review (RN CM) Complete  Some recent data might be hidden

## 2021-01-17 NOTE — Progress Notes (Signed)
And in PROGRESS NOTE    Rebecca Huerta  KPT:[id number] DOB: 1946/04/03 DOA: 01/12/2021 PCP: Caprice Renshaw, MD   Chief Complaint  Patient presents with   Altered Mental Status    Brief admission narrative:  As per H&P written by Dr. [place] Pink on 01/12/2021 Rebecca Huerta is a 75 y.o. female with a past medical history of atrial fibrillation on anticoagulation, history of dementia, history of anxiety, history of chronic systolic CHF who was recently hospitalized and discharged on July 27 after being managed for acute systolic and diastolic CHF.  She also has a longstanding history of COPD and was wheezing as well during previous hospitalization.  She was discharged to skilled nursing facility.  According to reports patient was found at the SNF on the bathroom floor playing with her stool.  She was noted to be cool to touch.  Patient appears to be pleasantly confused.  Not much information is available from her.  She however denies any chest pain abdominal pain.  Denies any shortness of breath currently.  History is limited due to her dementia.   In the emergency department she was found to have hypoglycemia.  She was noted to be significantly hypothermic.  Lactic acid level was greater than 11.  UA was noted to be abnormal.  Incidentally she also tested positive for COVID-19 on August 1 in the skilled nursing facility.  Patient was hospitalized for further management.  Assessment & Plan: 1-sepsis secondary to E. coli UTI: Present on admission -Patient met criteria on presentation with hypothermia, hepatic encephalopathy, abnormal urinalysis suggesting UTI and elevated lactic acid. -Significant improvement with fluid resuscitation and antibiotic therapy. -Continue supportive care and constant reorientation. -sepsis features essentially resolved at this time -More than 100,000 colonies of E. coli; pansensitive. -Antibiotic transitioned to cefdinir twice a day following sensitivity; so far well-tolerated.   Planning for a total of 7 days of antibiotics (currently day 6 out of 7). -Continue to maintain adequate hydration (with daily weight and strict I's and O's, given history of CHF).  2-Chronic systolic heart failure ([place]) -Patient with signs of dehydration on physical exam at time of admission. -Most recent ejection fraction 40% from 2D echo done in July 2022 -Continue to follow daily weights, strict I's and O's and closely monitor sodium intake. -Overall CHF condition is stable and compensated.  3-COVID-19 infection -Appears to be incidental -No requiring oxygen supplementation -Chest x-ray without acute infiltrates. -Patient will complete a total of 3 days remdesivir infusion given risk factors -Continue supportive care and symptomatic management.  4-acute kidney injury on chronic kidney disease a stage IIIb -Continue to minimize/avoid nephrotoxic agents -Continue to maintain adequate hydration -Follow-up renal function trend and stability  -Renal function currently back to baseline.  5-hypokalemia -Repleted and within normal limits currently -Continue to follow electrolytes trend intermittently. -We will discontinue telemetry.  6-acute metabolic encephalopathy in the setting of dementia and acute UTI -Continue treatment as mentioned above for UTI. -Continue consult reorientation and supportive care.  7-history of dementia with behavioral disturbance and -Continue Namenda -Continue supportive care and constant reorientation -Continue the use of as needed Xanax and as needed Haldol.  8-permanent atrial fibrillation -Continue carvedilol and the use of Xarelto for secondary prevention. -Overall rate controlled and stable.  9-essential hypertension -Stable and well-controlled -Continue current antihypertensive agents. -Continue to follow vital signs.  10-hypoglycemia -Probably in the setting of poor oral intake -On presentation glucose level in the 30s range -No prior  history of diabetes and no using any  hypoglycemic agents -Continue to encourage oral intake -No need for consult CBGs monitoring any longer.  Patient has not been experiencing further hypoglycemic events. -Stable since supplementation provided, and so far no further episode of hypoglycemia seen.     DVT prophylaxis: Chronically anticoagulated on Xarelto. Code Status: DNR/DNI Family Communication: No family at bedside.  When attempted to contact patient's daughter over the phone for updates unable to reach. Disposition:   Status is: Inpatient  Remains inpatient appropriate because:IV treatments appropriate due to intensity of illness or inability to take PO  Dispo:  Patient From: [place]  Planned Disposition: [place]  Medically stable for discharge: yes, patient is medically stable for discharge.  Waiting final evaluation by skilled nursing facility [facility]) to finalize approval.       Consultants:  None  Procedures:  See below for x-ray reports.  Antimicrobials:  Rocephin  Cefdinir.  Subjective: No chest pain, no nausea, no vomiting, no fever.  In no acute distress currently.  Objective: Vitals:   01/16/21 0846 01/16/21 2111 01/17/21 0611 01/17/21 1317  BP: (!) 145/93 (!) 138/93 (!) 148/89 (!) 148/90  Pulse: 84 85 88 87  Resp: '20 14 18 18  ' Temp: 98 F (36.7 C) 98.7 F (37.1 C) 98.2 F (36.8 C) (!) 97.4 F (36.3 C)  TempSrc: Oral  Oral Oral  SpO2: 100% 97% 98% (!) 84%  Weight:      Height:        Intake/Output Summary (Last 24 hours) at 01/17/2021 1551 Last data filed at 01/17/2021 0900 Gross per 24 hour  Intake 240 ml  Output --  Net 240 ml   Filed Weights   01/12/21 0736 01/13/21 2100  Weight: 57 kg 58.9 kg    Examination: General exam: In no acute distress; following commands appropriately.  No nausea, no vomiting.  Patient denies chest pain or shortness of breath.  In good spirit.  Chronically ill in appearance and  generally weak. Respiratory system: Good air movement bilaterally; no using accessory muscle.  Good saturation on room air. Cardiovascular system:RRR. No rubs or gallops; no JVD. Gastrointestinal system: Abdomen is nondistended, soft and nontender. No organomegaly or masses felt. Normal bowel sounds heard. Central nervous system: No focal neurological deficits. Extremities: No cyanosis or clubbing. Skin: No rashes, lesions or ulcers Psychiatry: Judgement and insight appear impaired secondary to underlying dementia.. Mood & affect appropriate currently.   Data Reviewed: I have personally reviewed following labs and imaging studies  CBC: Recent Labs  Lab 01/12/21 0705 01/13/21 0506 01/14/21 0339  WBC 5.6 6.9 4.2  NEUTROABS 4.6  --   --   HGB 12.7 10.9* 10.8*  HCT 41.9 36.2 35.8*  MCV 88.0 88.9 89.1  PLT 178 153 142*    Basic Metabolic Panel: Recent Labs  Lab 01/12/21 0705 01/13/21 0506 01/14/21 0339  NA 137 141 140  K 3.0* 3.5 3.5  CL 103 113* 111  CO2 '23 23 24  ' GLUCOSE 292* 91 75  BUN 48* 36* 28*  CREATININE 2.08* 1.39* 0.96  CALCIUM 8.1* 7.7* 7.6*    GFR: Estimated Creatinine Clearance: 43.7 mL/min (by C-G formula based on SCr of 0.96 mg/dL).  Liver Function Tests: Recent Labs  Lab 01/12/21 0705 01/13/21 0506  AST 32 80*  ALT 21 25  ALKPHOS 57 47  BILITOT 0.5 0.4  PROT 6.5 5.5*  ALBUMIN 3.1* 2.6*    CBG: Recent Labs  Lab 01/13/21 1845 01/14/21 0755 01/14/21 1237 01/14/21 1636 01/15/21 0756  GLUCAP 77 95 78 91 82     Recent Results (from the past 240 hour(s))  Urine Culture     Status: Abnormal   Collection Time: 01/12/21  7:05 AM   Specimen: Urine, Clean Catch  Result Value Ref Range Status   Specimen Description   Final    URINE, CLEAN CATCH Performed at [facility], [address], [place], [place] [address]    Special Requests   Final    NONE Performed at [facility], [address] [place], [place] [address]    Culture >=100,000  COLONIES/mL ESCHERICHIA COLI (A)  Final   Report Status 01/15/2021 FINAL  Final   Organism ID, Bacteria ESCHERICHIA COLI (A)  Final      Susceptibility   Escherichia coli - MIC*    AMPICILLIN 4 SENSITIVE Sensitive     CEFAZOLIN <=4 SENSITIVE Sensitive     CEFEPIME <=0.12 SENSITIVE Sensitive     CEFTRIAXONE <=0.25 SENSITIVE Sensitive     CIPROFLOXACIN <=0.25 SENSITIVE Sensitive     GENTAMICIN <=1 SENSITIVE Sensitive     IMIPENEM <=0.25 SENSITIVE Sensitive     NITROFURANTOIN <=16 SENSITIVE Sensitive     TRIMETH/SULFA <=20 SENSITIVE Sensitive     AMPICILLIN/SULBACTAM <=2 SENSITIVE Sensitive     PIP/TAZO <=4 SENSITIVE Sensitive     * >=100,000 COLONIES/mL ESCHERICHIA COLI  Resp Panel by RT-PCR (Flu A&B, Covid) Urine, Catheterized     Status: Abnormal   Collection Time: 01/12/21  7:31 AM   Specimen: Urine, Catheterized; Nasopharyngeal(NP) swabs in vial transport medium  Result Value Ref Range Status   SARS Coronavirus 2 by RT PCR POSITIVE (A) NEGATIVE Final    Comment: CRITICAL RESULT CALLED TO, READ BACK BY AND VERIFIED WITH: LONG,J AT 0935 ON 8.3.22 BY RUCINSKI,B (NOTE) SARS-CoV-2 target nucleic acids are DETECTED.  The SARS-CoV-2 RNA is generally detectable in upper respiratory specimens during the acute phase of infection. Positive results are indicative of the presence of the identified virus, but do not rule out bacterial infection or co-infection with other pathogens not detected by the test. Clinical correlation with patient history and other diagnostic information is necessary to determine patient infection status. The expected result is Negative.  Fact Sheet for Patients: EntrepreneurPulse.com.au  Fact Sheet for Healthcare Providers: IncredibleEmployment.be  This test is not yet approved or cleared by the [place] FDA and  has been authorized for detection and/or diagnosis of SARS-CoV-2 by FDA under an Emergency Use Authorization  (EUA).  This EUA will remain in effect (meaning th is test can be used) for the duration of  the COVID-19 declaration under Section 564(b)(1) of the Act, 21 U.S.C. section 360bbb-3(b)(1), unless the authorization is terminated or revoked sooner.     Influenza A by PCR NEGATIVE NEGATIVE Final   Influenza B by PCR NEGATIVE NEGATIVE Final    Comment: (NOTE) The Xpert Xpress SARS-CoV-2/FLU/RSV plus assay is intended as an aid in the diagnosis of influenza from Nasopharyngeal swab specimens and should not be used as a sole basis for treatment. Nasal washings and aspirates are unacceptable for Xpert Xpress SARS-CoV-2/FLU/RSV testing.  Fact Sheet for Patients: EntrepreneurPulse.com.au  Fact Sheet for Healthcare Providers: IncredibleEmployment.be  This test is not yet approved or cleared by the [place] FDA and has been authorized for detection and/or diagnosis of SARS-CoV-2 by FDA under an Emergency Use Authorization (EUA). This EUA will remain in effect (meaning this test can be used) for the duration of the COVID-19 declaration under Section 564(b)(1) of  the Act, 21 U.S.C. section 360bbb-3(b)(1), unless the authorization is terminated or revoked.  Performed at [facility], [address] [place], [place] [address]   Culture, blood (routine x 2)     Status: None (Preliminary result)   Collection Time: 01/12/21  9:08 AM   Specimen: BLOOD RIGHT HAND  Result Value Ref Range Status   Specimen Description BLOOD RIGHT HAND  Final   Special Requests   Final    AEROBIC BOTTLE ONLY Blood Culture results may not be optimal due to an inadequate volume of blood received in culture bottles   Culture   Final    NO GROWTH 4 DAYS Performed at [facility], [address], [place], [place] [address]    Report Status PENDING  Incomplete  Culture, blood (routine x 2)     Status: None (Preliminary result)   Collection Time: 01/12/21  9:09 AM   Specimen:  Right Antecubital; Blood  Result Value Ref Range Status   Specimen Description RIGHT ANTECUBITAL  Final   Special Requests   Final    BOTTLES DRAWN AEROBIC AND ANAEROBIC Blood Culture results may not be optimal due to an excessive volume of blood received in culture bottles   Culture   Final    NO GROWTH 4 DAYS Performed at [facility], [address], [place], [place] [address]    Report Status PENDING  Incomplete  MRSA Next Gen by PCR, Nasal     Status: Abnormal   Collection Time: 01/13/21  9:51 PM   Specimen: Nasal Mucosa; Nasal Swab  Result Value Ref Range Status   MRSA by PCR Next Gen DETECTED (A) NOT DETECTED Final    Comment: RESULT CALLED TO, READ BACK BY AND VERIFIED WITH:  BODEY KINDLEY 8/5 @ 0112 BY S BEARD (NOTE) The GeneXpert MRSA Assay (FDA approved for NASAL specimens only), is one component of a comprehensive MRSA colonization surveillance program. It is not intended to diagnose MRSA infection nor to guide or monitor treatment for MRSA infections. Test performance is not FDA approved in patients less than 89 years old. Performed at [facility], [address], [place], [place] [address]      Radiology Studies: No results found.   Scheduled Meds:  carvedilol  25 mg Oral BID WC   cefdinir  300 mg Oral Q12H   Chlorhexidine Gluconate Cloth  6 each Topical Daily   memantine  10 mg Oral Daily   mupirocin ointment  1 application Nasal BID   QUEtiapine  50 mg Oral QHS   Rivaroxaban  15 mg Oral Q supper   Continuous Infusions:     LOS: 5 days    Time spent: 30 minutes.   Barton Dubois, MD [facility]   To contact the attending provider between 7A-7P or the covering provider during after hours 7P-7A, please log into the web site [email or web address] and access using universal [place] password for that web site. If you do not have the password, please call the hospital operator.  01/17/2021, 3:51 PM

## 2021-01-17 NOTE — Evaluation (Signed)
Physical Therapy Evaluation Patient Details Name: Rebecca Huerta MRN: [id number] DOB: 07-06-1945 Today's Date: 01/17/2021   History of Present Illness  Iley Deignan is a 75 y.o. female with a past medical history of atrial fibrillation on anticoagulation, history of dementia, history of anxiety, history of chronic systolic CHF who was recently hospitalized and discharged on July 27 after being managed for acute systolic and diastolic CHF.  She also has a longstanding history of COPD and was wheezing as well during previous hospitalization.  She was discharged to skilled nursing facility.  According to reports patient was found at the SNF on the bathroom floor playing with her stool.  She was noted to be cool to touch.  Patient appears to be pleasantly confused.  Not much information is available from her.  She however denies any chest pain abdominal pain.  Denies any shortness of breath currently.  History is limited due to her dementia.   Clinical Impression  Patient limited for functional mobility as stated below secondary to BLE weakness, fatigue and impaired standing balance. Patient does not require physical assist with bed mobility but does require increased time to complete. Patient demonstrates good sitting balance and sitting tolerance EOB. She transfers to standing with use of RW with min G/A and ambulates in room with RW. Patient given assist for strength and balance deficits. Patient returned to bed at end of session. Patient will benefit from continued physical therapy in hospital and recommended venue below to increase strength, balance, endurance for safe ADLs and gait.     Follow Up Recommendations SNF    Equipment Recommendations  None recommended by PT    Recommendations for Other Services       Precautions / Restrictions Precautions Precautions: Fall Restrictions Weight Bearing Restrictions: No      Mobility  Bed Mobility Overal bed mobility: Needs Assistance Bed  Mobility: Supine to Sit     Supine to sit: Supervision     General bed mobility comments: slightly increased time, labored movement    Transfers Overall transfer level: Needs assistance Equipment used: Rolling walker (2 wheeled) Transfers: Sit to/from [employer] Sit to Stand: Min assist;Min guard Stand pivot transfers: Min assist;Min guard       General transfer comment: using RW  Ambulation/Gait Ambulation/Gait assistance: Min assist;Min guard Gait Distance (Feet): 20 Feet Assistive device: Rolling walker (2 wheeled) Gait Pattern/deviations: Decreased step length - right;Decreased step length - left;Decreased stride length;Trunk flexed Gait velocity: decreased   General Gait Details: ambulates in room with RW  Stairs            Wheelchair Mobility    Modified Rankin (Stroke Patients Only)       Balance Overall balance assessment: Needs assistance Sitting-balance support: Feet supported;No upper extremity supported Sitting balance-Leahy Scale: Good Sitting balance - Comments: seated at EOB   Standing balance support: During functional activity;No upper extremity supported Standing balance-Leahy Scale: Poor Standing balance comment: fair using RW                             Pertinent Vitals/Pain Pain Assessment: No/denies pain    Home Living Family/patient expects to be discharged to:: Skilled nursing facility                      Prior Function Level of Independence: Needs assistance   Gait / Transfers Assistance Needed: household ambulator without AD  ADL's / [employer]  Needed: assisted by family/friends        Hand Dominance        Extremity/Trunk Assessment   Upper Extremity Assessment Upper Extremity Assessment: Generalized weakness    Lower Extremity Assessment Lower Extremity Assessment: Generalized weakness    Cervical / Trunk Assessment Cervical / Trunk Assessment: Kyphotic   Communication   Communication: No difficulties  Cognition Arousal/Alertness: Awake/alert Behavior During Therapy: WFL for tasks assessed/performed Overall Cognitive Status: History of cognitive impairments - at baseline                                        General Comments      Exercises     Assessment/Plan    PT Assessment Patient needs continued PT services  PT Problem List Decreased strength;Decreased activity tolerance;Decreased balance;Decreased mobility       PT Treatment Interventions DME instruction;Stair training;Gait training;Functional mobility training;Therapeutic activities;Therapeutic exercise;Balance training;Patient/family education    PT Goals (Current goals can be found in the Care Plan section)  Acute Rehab PT Goals Patient Stated Goal: return home after rehab PT Goal Formulation: With patient/family Time For Goal Achievement: 01/31/21 Potential to Achieve Goals: Good    Frequency Min 2X/week   Barriers to discharge        Co-evaluation               AM-PAC PT "6 Clicks" Mobility  Outcome Measure Help needed turning from your back to your side while in a flat bed without using bedrails?: None Help needed moving from lying on your back to sitting on the side of a flat bed without using bedrails?: A Little Help needed moving to and from a bed to a chair (including a wheelchair)?: A Little Help needed standing up from a chair using your arms (e.g., wheelchair or bedside chair)?: A Little Help needed to walk in hospital room?: A Little Help needed climbing 3-5 steps with a railing? : A Lot 6 Click Score: 18    End of Session   Activity Tolerance: Patient tolerated treatment well Patient left: in bed;with call bell/phone within reach;with bed alarm set Nurse Communication: Mobility status PT Visit Diagnosis: Unsteadiness on feet (R26.81);Other abnormalities of gait and mobility (R26.89);Muscle weakness (generalized)  (M62.81)    Time: 9509-3267 PT Time Calculation (min) (ACUTE ONLY): 13 min   Charges:   PT Evaluation $PT Eval Low Complexity: 1 Low         2:48 PM, 01/17/21 Wyman Songster PT, DPT Physical Therapist at [facility]

## 2021-01-17 NOTE — Plan of Care (Signed)
  Problem: Acute Rehab PT Goals(only PT should resolve) Goal: Patient Will Transfer Sit To/From Stand Outcome: Progressing Flowsheets (Taken 01/17/2021 1450) Patient will transfer sit to/from stand: with min guard assist Goal: Pt Will Transfer Bed To Chair/Chair To Bed Outcome: Progressing Flowsheets (Taken 01/17/2021 1450) Pt will Transfer Bed to Chair/Chair to Bed: min guard assist Goal: Pt Will Ambulate Outcome: Progressing Flowsheets (Taken 01/17/2021 1450) Pt will Ambulate:  50 feet  with min guard assist  with rolling walker Goal: Pt/caregiver will Perform Home Exercise Program Outcome: Progressing Flowsheets (Taken 01/17/2021 1450) Pt/caregiver will Perform Home Exercise Program:  For increased strengthening  For improved balance  Independently  With Supervision, verbal cues required/provided  2:50 PM, 01/17/21 Wyman Songster PT, DPT Physical Therapist at [facility]

## 2021-01-18 ENCOUNTER — Other Ambulatory Visit: Payer: Self-pay | Admitting: Student

## 2021-01-18 DIAGNOSIS — E162 Hypoglycemia, unspecified: Secondary | ICD-10-CM

## 2021-01-18 DIAGNOSIS — N390 Urinary tract infection, site not specified: Secondary | ICD-10-CM

## 2021-01-18 LAB — CULTURE, BLOOD (ROUTINE X 2)
Culture: NO GROWTH
Culture: NO GROWTH

## 2021-01-18 MED ORDER — CEFDINIR 300 MG PO CAPS: 300.0000 mg | ORAL_CAPSULE | Freq: Two times a day (BID) | ORAL | 0 refills | Status: AC

## 2021-01-18 MED ORDER — IPRATROPIUM-ALBUTEROL 0.5-2.5 (3) MG/3ML IN SOLN: 3.0000 mL | Freq: Four times a day (QID) | RESPIRATORY_TRACT | Status: AC | PRN

## 2021-01-18 MED ORDER — FUROSEMIDE 40 MG PO TABS: 20.0000 mg | ORAL_TABLET | Freq: Every day | ORAL | Status: AC

## 2021-01-18 MED ORDER — ALPRAZOLAM 0.25 MG PO TABS: 0.2500 mg | ORAL_TABLET | Freq: Three times a day (TID) | ORAL | 0 refills | Status: AC | PRN

## 2021-01-18 NOTE — Care Management Important Message (Signed)
Important Message  Patient Details  Name: Rebecca Huerta MRN: [id number] Date of Birth: September 18, 1945   Medicare Important Message Given:  Yes - Important Message mailed due to current National Emergency     Corey Harold 01/18/2021, 2:57 PM

## 2021-01-18 NOTE — Discharge Summary (Signed)
Physician Discharge Summary  Rebecca Huerta DPO:[id number] DOB: 1945/08/23 DOA: 01/12/2021  PCP: Caprice Renshaw, MD  Admit date: 01/12/2021 Discharge date: 01/18/2021  Time spent: 35 minutes  Recommendations for Outpatient Follow-up:  Repeat BMET to follow electrolytes and renal function  Reassess volume status and further adjust diuretic regimen as needed   Discharge Diagnoses:  Active Problems:   Chronic systolic heart failure (HCC)   Dementia with behavioral disturbance (HCC)   Hypothermia   Sepsis secondary to UTI ([place])   Sepsis ([place])   Hypoglycemia   Lower urinary tract infection, acute Acute on chronic renal failure stage 3b   Discharge Condition: stable and improved. Discharge home with home health services. Patient also informed to follow up with PCP and cardiology service as an outpatient.  Code status: DNR  Diet recommendation: heart healthy/low sodium diet.  Filed Weights   01/12/21 0736 01/13/21 2100  Weight: 57 kg 58.9 kg    History of present illness:  As per H&P written by Dr. [place] Pink on 01/12/2021 Rebecca Huerta is a 75 y.o. female with a past medical history of atrial fibrillation on anticoagulation, history of dementia, history of anxiety, history of chronic systolic CHF who was recently hospitalized and discharged on July 27 after being managed for acute systolic and diastolic CHF.  She also has a longstanding history of COPD and was wheezing as well during previous hospitalization.  She was discharged to skilled nursing facility.  According to reports patient was found at the SNF on the bathroom floor playing with her stool.  She was noted to be cool to touch.  Patient appears to be pleasantly confused.  Not much information is available from her.  She however denies any chest pain abdominal pain.  Denies any shortness of breath currently.  History is limited due to her dementia.   In the emergency department she was found to have hypoglycemia.  She was noted to be  significantly hypothermic.  Lactic acid level was greater than 11.  UA was noted to be abnormal.  Incidentally she also tested positive for COVID-19 on August 1 in the skilled nursing facility.  Patient was hospitalized for further management.  Hospital Course:  1-sepsis secondary to E. coli UTI: Present on admission -Patient met criteria on presentation with hypothermia, hepatic encephalopathy, abnormal urinalysis suggesting UTI and elevated lactic acid. -Significant improvement with fluid resuscitation and antibiotic therapy. -Continue supportive care and constant reorientation. -sepsis features essentially resolved at this time -More than 100,000 colonies of E. coli; pansensitive. -Antibiotic transitioned to cefdinir twice a day following sensitivity; so far well-tolerated.  Planning for a total of 7 days of antibiotics (currently day 6 out of 7). One day of cefdinir prescribed at discharge. -Continue to maintain adequate hydration (with daily weight and strict I's and O's, given history of CHF).   2-Chronic systolic heart failure ([place]) -Patient with signs of dehydration on physical exam at time of admission. -Most recent ejection fraction 40% from 2D echo done in July 2022 -Continue to follow daily weights and closely monitor sodium intake. -Overall CHF condition is stable and compensated. -resume home CHF meds and follow up with cardiology service after discharge.   3-COVID-19 infection -Appears to be incidental -No requiring oxygen supplementation -Chest x-ray without acute infiltrates. -Patient completed a total of 3 days remdesivir infusion given risk factors -Continue supportive care and symptomatic management. -advise to follow the 3W's   4-acute kidney injury on chronic kidney disease a stage IIIb -Continue to maintain adequate hydration -Follow-up  renal function trend and stability with repeat basic metabolic panel follow-up visit. -Renal function currently back to  baseline. -Safe to resume home CHF medications.   5-hypokalemia -Repleted and within normal limits currently -Repeat basic metabolic panel to follow renal function   6-acute metabolic encephalopathy in the setting of dementia and acute UTI -Continue treatment as mentioned above for UTI. -Continue constant reorientation and supportive care. -Continue as needed anxiolytics and the use of Seroquel.   7-history of dementia with behavioral disturbance and -Continue Namenda -Continue constant reorientation and resume the use of as needed Xanax and Seroquel.   8-permanent atrial fibrillation -Continue carvedilol and the use of Xarelto for secondary prevention. -Overall rate controlled and stable.   9-essential hypertension -Stable and well-controlled -Continue current antihypertensive agents. -Continue to follow vital signs. -Patient instructed healthy/low-sodium diet.   10-hypoglycemia -Probably in the setting of poor oral intake -On presentation glucose level in the 30s range -No prior history of diabetes and no using any hypoglycemic agents -Continue to encourage oral intake and nutrition. -No prior history of diabetes  -Most likely in the setting of sepsis.    Procedures: See below for x-ray reports.  Consultations: None   Discharge Exam: Vitals:   01/18/21 1040 01/18/21 1337  BP:  (!) 147/93  Pulse: (!) 59 (!) 47  Resp:  16  Temp:  97.8 F (36.6 C)  SpO2: (!) 86% (!) 85%    General exam: In no acute distress; following commands appropriately.  No nausea, no vomiting.  Patient denies chest pain or shortness of breath.  In good spirit.  Chronically ill in appearance and generally weak. Respiratory system: Good air movement bilaterally; no using accessory muscle.  Good saturation on room air. Cardiovascular system:RRR. No rubs or gallops; no JVD. Gastrointestinal system: Abdomen is nondistended, soft and nontender. No organomegaly or masses felt. Normal bowel sounds  heard. Central nervous system: No focal neurological deficits. Extremities: No cyanosis or clubbing. Skin: No rashes, lesions or ulcers Psychiatry: Judgement and insight appear impaired secondary to underlying dementia.. Mood & affect appropriate currently.     Discharge Instructions   Discharge Instructions     (HEART FAILURE PATIENTS) Call MD:  Anytime you have any of the following symptoms: 1) 3 pound weight gain in 24 hours or 5 pounds in 1 week 2) shortness of breath, with or without a dry hacking cough 3) swelling in the hands, feet or stomach 4) if you have to sleep on extra pillows at night in order to breathe.   Complete by: As directed    Diet - low sodium heart healthy   Complete by: As directed    Discharge instructions   Complete by: As directed    Take medications as prescribed Maintain adequate hydration Check weight on daily basis Follow low sodium/heart healthy diet Please arrange follow up with PCP in 10 days and follow up with cardiology service as previously instructed. -continue to practice the 3W's: Wear a mask, Wait 6 feet apart and Wash hand frequently.   Increase activity slowly   Complete by: As directed       Allergies as of 01/18/2021       Reactions   Penicillins Rash        Medication List     STOP taking these medications    guaiFENesin 600 MG 12 hr tablet Commonly known as: MUCINEX       TAKE these medications    acetaminophen 325 MG tablet Commonly known as: TYLENOL Take  2 tablets (650 mg total) by mouth every 6 (six) hours.   ALPRAZolam 0.25 MG tablet Commonly known as: XANAX Take 1 tablet (0.25 mg total) by mouth 3 (three) times daily as needed for anxiety.   budesonide 0.25 MG/2ML nebulizer solution Commonly known as: PULMICORT Take 2 mLs (0.25 mg total) by nebulization 2 (two) times daily.   carvedilol 25 MG tablet Commonly known as: COREG Take 1 tablet (25 mg total) by mouth 2 (two) times daily with a meal.    cefdinir 300 MG capsule Commonly known as: OMNICEF Take 1 capsule (300 mg total) by mouth every 12 (twelve) hours for 1 day.   Fish Oil 1000 MG Caps Take 1 capsule by mouth daily.   furosemide 40 MG tablet Commonly known as: LASIX Take 0.5 tablets (20 mg total) by mouth daily. What changed: how much to take   ipratropium-albuterol 0.5-2.5 (3) MG/3ML Soln Commonly known as: DUONEB Take 3 mLs by nebulization every 6 (six) hours as needed. What changed:  when to take this reasons to take this   memantine 10 MG tablet Commonly known as: NAMENDA Take 1 tablet (10 mg total) by mouth daily.   multivitamin tablet Take 1 tablet by mouth daily.   nicotine 21 mg/24hr patch Commonly known as: NICODERM CQ - dosed in mg/24 hours Place 1 patch (21 mg total) onto the skin daily.   polyethylene glycol 17 g packet Commonly known as: MIRALAX / GLYCOLAX Take 17 g by mouth daily as needed for mild constipation.   QUEtiapine 50 MG tablet Commonly known as: SEROQUEL Take 1 tablet (50 mg total) by mouth at bedtime.   Rivaroxaban 15 MG Tabs tablet Commonly known as: XARELTO Take 1 tablet (15 mg total) by mouth daily with supper.   sacubitril-valsartan 24-26 MG Commonly known as: ENTRESTO Take 1 tablet by mouth 2 (two) times daily.       Allergies  Allergen Reactions   Penicillins Rash    Follow-up Information     Caprice Renshaw, MD. Schedule an appointment as soon as possible for a visit in 10 day(s).   Specialty: Internal Medicine Contact information: [place] [address] [phone]         Arnoldo Lenis, MD .   Specialty: Cardiology Contact information: [address] [place] [address] [phone]                 The results of significant diagnostics from this hospitalization (including imaging, microbiology, ancillary and laboratory) are listed below for reference.    Significant Diagnostic Studies: CT Head Wo  Contrast  Result Date: 01/12/2021 CLINICAL DATA:  Altered mental status EXAM: CT HEAD WITHOUT CONTRAST TECHNIQUE: Contiguous axial images were obtained from the base of the skull through the vertex without intravenous contrast. COMPARISON:  01/03/2021 FINDINGS: Brain: There is atrophy and chronic small vessel disease changes. Old right thalamic and bilateral basal ganglia lacunar infarcts. No acute intracranial abnormality. Specifically, no hemorrhage, hydrocephalus, mass lesion, acute infarction, or significant intracranial injury. Vascular: No hyperdense vessel or unexpected calcification. Skull: No acute calvarial abnormality. Sinuses/Orbits: No acute findings Other: None IMPRESSION: Old bilateral basal ganglia and right thalamic lacunar infarcts. Atrophy, chronic microvascular disease. No acute intracranial abnormality. Electronically Signed   By: Rolm Baptise M.D.   On: 01/12/2021 09:34   MR BRAIN WO CONTRAST  Result Date: 01/03/2021 CLINICAL DATA:  Neurological deficit, acute, stroke suspected. Weakness over the last 2 days. EXAM: MRI HEAD WITHOUT CONTRAST TECHNIQUE: Multiplanar,  multiecho pulse sequences of the brain and surrounding structures were obtained without intravenous contrast. COMPARISON:  CT study earlier same day. FINDINGS: Brain: Diffusion imaging does not show any acute or subacute infarction. There is considerable artifact in the region of the medulla, lower pons and inferior cerebellum. There are chronic small-vessel ischemic changes of the pons. Numerous old small vessel infarctions within the inferior cerebellum. Cerebral hemispheres show chronic small-vessel ischemic changes of the thalami, basal ganglia and hemispheric white matter. No large vessel territory infarction. No mass lesion, hemorrhage, hydrocephalus or extra-axial collection. Vascular: Major vessels at the base of the brain show flow. Skull and upper cervical spine: Negative Sinuses/Orbits: Clear/normal Other: None  IMPRESSION: No acute or subacute finding. Numerous old small vessel infarctions within the inferior cerebellum. Chronic small-vessel ischemic changes of the pons, thalami, basal ganglia and cerebral hemispheric white matter. Because artifact on diffusion imaging in the lower portion of the scan, presumably from dental work, a small acute infarction in the inferior cerebellum might be inapparent, but I have no specific basis upon which to suggest that. Electronically Signed   By: Nelson Chimes M.D.   On: 01/03/2021 16:12   DG Chest Port 1 View  Result Date: 01/12/2021 CLINICAL DATA:  75 year old female with history of altered mental status. Recent history of COVID infection. EXAM: PORTABLE CHEST 1 VIEW COMPARISON:  Chest x-ray 12/31/2020. FINDINGS: Lung volumes are normal. Mild diffuse peribronchial cuffing and interstitial prominence. No consolidative airspace disease. No definite pleural effusions. No pneumothorax. No evidence of pulmonary edema. Heart size is mildly enlarged. Upper mediastinal contours are within normal limits. Atherosclerotic calcifications in the thoracic aorta. IMPRESSION: 1. Mild diffuse peribronchial cuffing and interstitial prominence concerning for acute bronchitis. 2. Mild cardiomegaly. 3. Aortic atherosclerosis. Electronically Signed   By: Vinnie Langton M.D.   On: 01/12/2021 07:25   DG Chest Port 1 View  Result Date: 12/31/2020 CLINICAL DATA:  Dyspnea EXAM: PORTABLE CHEST 1 VIEW COMPARISON:  11/23/2020, 04/06/2020 FINDINGS: The lungs are symmetrically well expanded. Chronic underlying parenchymal interstitial changes are again identified, however, there are superimposed interstitial infiltrates now present likely representing mild superimposed interstitial pulmonary edema. The cardiac size is mildly enlarged, similar to prior examination, but progressive since 04/06/2020. No pneumothorax or pleural effusion. No acute bone abnormality. IMPRESSION: Mild cardiogenic failure  superimposed upon underlying mild chronic interstitial lung disease. Electronically Signed   By: Fidela Salisbury MD   On: 12/31/2020 04:05   EEG adult  Result Date: 01/03/2021 Lora Havens, MD     01/03/2021  6:03 PM Patient Name: Rebecca Huerta MRN: [id number] Epilepsy Attending: Lora Havens Referring Physician/Provider: Dr Lesleigh Noe Date: 01/03/2021 Duration: 23.16 mins Patient history: 75yo F with sudden onset of right upper extremity weakness and aphasia. Eeg to evaluate for seizure Level of alertness: Awake, asleep AEDs during EEG study: None Technical aspects: This EEG study was done with scalp electrodes positioned according to the 10-20 International system of electrode placement. Electrical activity was acquired at a sampling rate of '500Hz'  and reviewed with a high frequency filter of '70Hz'  and a low frequency filter of '1Hz' . EEG data were recorded continuously and digitally stored. Description: The posterior dominant rhythm consists of 8 Hz activity of moderate voltage (25-35 uV) seen predominantly in posterior head regions, symmetric and reactive to eye opening and eye closing. Sleep was characterized by vertex waves, sleep spindles (12 to 14 Hz), maximal frontocentral region. EEG showed continuous generalized polymorphic mixed frequencies with predominantly 5 to 8 Hz  theta as well as intermittent 2-'3hz'  generalized delta slowing.  Hyperventilation and photic stimulation were not performed.   ABNORMALITY - Continuous slow, generalized IMPRESSION: This study is suggestive of mild to moderate diffuse encephalopathy, nonspecific etiology. No seizures or epileptiform discharges were seen throughout the recording. Lora Havens   ECHOCARDIOGRAM COMPLETE  Result Date: 12/28/2020    ECHOCARDIOGRAM REPORT   Patient Name:   Rebecca Huerta Date of Exam: 12/27/2020 Medical Rec #:  [id number]   Height:       64.0 in Accession #:    [id number]  Weight:       132.2 lb Date of Birth:  24-Jul-1945   BSA:           1.641 m Patient Age:    79 years    BP:           160/110 mmHg Patient Gender: F           HR:           65 bpm. Exam Location:  [facility] Procedure: 2D Echo, Cardiac Doppler and Color Doppler Indications:    R06.02 (ICD-10-CM) - SOB (shortness of breath)  History:        Patient has prior history of Echocardiogram examinations, most                 recent 04/20/2015. CHF and Cardiomyopathy, COPD,                 Arrythmias:Atrial Fibrillation; Risk Factors:Former Smoker and                 Hypertension. ETOH.  Sonographer:    Alvino Chapel RCS Referring Phys: [id number] [place]  1. The apical viiews are somehat foreshortend There is diffuse hypokinesis, worse in the basal inferior and inferoseptal walls . Left ventricular ejection fraction, by estimation, is 40%%. The left ventricle demonstrates global hypokinesis. The left ventricular internal cavity size was severely dilated. There is mild left ventricular hypertrophy. Left ventricular diastolic parameters are indeterminate.  2. Right ventricular systolic function is normal. The right ventricular size is normal. There is moderately elevated pulmonary artery systolic pressure.  3. Left atrial size was severely dilated.  4. The mitral valve is normal in structure. Moderate mitral valve regurgitation.  5. Tricuspid valve regurgitation is mild to moderate.  6. The aortic valve is tricuspid. Aortic valve regurgitation is not visualized. Mild aortic valve sclerosis is present, with no evidence of aortic valve stenosis.  7. The inferior vena cava is dilated in size with >50% respiratory variability, suggesting right atrial pressure of 8 mmHg. Comparison(s): No prior Echocardiogram. FINDINGS  Left Ventricle: The apical viiews are somehat foreshortend There is diffuse hypokinesis, worse in the basal inferior and inferoseptal walls. Left ventricular ejection fraction, by estimation, is 40%%. The left ventricle demonstrates global hypokinesis. The left  ventricular internal cavity size was severely dilated. There is mild left ventricular hypertrophy. Left ventricular diastolic parameters are indeterminate. Right Ventricle: The right ventricular size is normal. Right vetricular wall thickness was not assessed. Right ventricular systolic function is normal. There is moderately elevated pulmonary artery systolic pressure. The tricuspid regurgitant velocity is  3.37 m/s, and with an assumed right atrial pressure of 3 mmHg, the estimated right ventricular systolic pressure is 14.9 mmHg. Left Atrium: Left atrial size was severely dilated. Right Atrium: Right atrial size was normal in size. Pericardium: Trivial pericardial effusion is present. Mitral Valve: The mitral valve is normal in structure. Moderate  mitral valve regurgitation. Tricuspid Valve: The tricuspid valve is normal in structure. Tricuspid valve regurgitation is mild to moderate. Aortic Valve: The aortic valve is tricuspid. Aortic valve regurgitation is not visualized. Mild aortic valve sclerosis is present, with no evidence of aortic valve stenosis. Pulmonic Valve: The pulmonic valve was normal in structure. Pulmonic valve regurgitation is not visualized. Aorta: The aortic root is normal in size and structure. Venous: The inferior vena cava is dilated in size with greater than 50% respiratory variability, suggesting right atrial pressure of 8 mmHg. IAS/Shunts: No atrial level shunt detected by color flow Doppler.  LEFT VENTRICLE PLAX 2D LVIDd:         6.30 cm LVIDs:         4.90 cm LV PW:         1.30 cm LV IVS:        1.00 cm LVOT diam:     1.90 cm LV SV:         42 LV SV Index:   26 LVOT Area:     2.84 cm  RIGHT VENTRICLE TAPSE (M-mode): 1.5 cm LEFT ATRIUM              Index        RIGHT ATRIUM           Index LA diam:        5.90 cm  3.60 cm/m   RA Area:     15.80 cm LA Vol (A2C):   182.0 ml 110.93 ml/m RA Volume:   36.10 ml  22.00 ml/m LA Vol (A4C):   213.0 ml 129.82 ml/m LA Biplane Vol: 197.0 ml  120.07 ml/m  AORTIC VALVE LVOT Vmax:   94.60 cm/s LVOT Vmean:  58.100 cm/s LVOT VTI:    0.149 m  AORTA Ao Root diam: 3.50 cm MITRAL VALVE                TRICUSPID VALVE MV Area (PHT): 6.65 cm     TR Peak grad:   45.4 mmHg MV Decel Time: 114 msec     TR Vmax:        337.00 cm/s MR Peak grad: 89.9 mmHg MR Mean grad: 47.0 mmHg     SHUNTS MR Vmax:      474.00 cm/s   Systemic VTI:  0.15 m MR Vmean:     317.0 cm/s    Systemic Diam: 1.90 cm MV E velocity: 127.00 cm/s Dorris Carnes MD Electronically signed by Dorris Carnes MD Signature Date/Time: 12/28/2020/1:49:19 AM    Final    CT HEAD CODE STROKE WO CONTRAST  Result Date: 01/03/2021 CLINICAL DATA:  Code stroke. Neuro deficit, acute, stroke suspected; aphasia and decreased right hand movement EXAM: CT HEAD WITHOUT CONTRAST TECHNIQUE: Contiguous axial images were obtained from the base of the skull through the vertex without intravenous contrast. COMPARISON:  04/06/2020 FINDINGS: Brain: There is no acute intracranial hemorrhage, mass effect, or edema. Patchy and confluent areas of low-attenuation in the supratentorial white matter are nonspecific but probably reflects similar moderate to advanced chronic microvascular ischemic changes. There are numerous small vessel infarcts involving the basal ganglia, thalamus, and adjacent white matter as well as the cerebellar hemispheres. Majority of these appear to be present on the prior study. New age-indeterminate involvement of the lateral right thalamus. There is no extra-axial collection. Vascular: No hyperdense vessel. There is intracranial atherosclerotic calcification at the skull base. Skull: Unremarkable. Sinuses/Orbits: No acute abnormality. Other: Mastoid air cells are clear. ASPECTS (  [place] Stroke Program Early CT Score) - Ganglionic level infarction (caudate, lentiform nuclei, internal capsule, insula, M1-M3 cortex): 7 - Supraganglionic infarction (M4-M6 cortex): 3 Total score (0-10 with 10 being normal): 10  IMPRESSION: There is no acute intracranial hemorrhage or evidence of acute infarction. ASPECT score is 10. These results were called by telephone at the time of interpretation on 01/03/2021 at 2:42 pm to provider [facility] , who verbally acknowledged these results. Electronically Signed   By: Macy Mis M.D.   On: 01/03/2021 14:42   CT ANGIO HEAD NECK W WO CM W PERF (CODE STROKE)  Result Date: 01/03/2021 CLINICAL DATA:  Neuro deficit, acute, stroke suspected; aphasia and decreased right hand motor EXAM: CT ANGIOGRAPHY HEAD AND NECK CT PERFUSION BRAIN TECHNIQUE: Multidetector CT imaging of the head and neck was performed using the standard protocol during bolus administration of intravenous contrast. Multiplanar CT image reconstructions and MIPs were obtained to evaluate the vascular anatomy. Carotid stenosis measurements (when applicable) are obtained utilizing NASCET criteria, using the distal internal carotid diameter as the denominator. Multiphase CT imaging of the brain was performed following IV bolus contrast injection. Subsequent parametric perfusion maps were calculated using RAPID software. CONTRAST:  161m OMNIPAQUE IOHEXOL 350 MG/ML SOLN COMPARISON:  None. FINDINGS: CTA NECK FINDINGS Aortic arch: Moderate calcified and noncalcified plaque arch patent great vessel origins. There is direct origin of the left artery arch. No high-grade proximal subclavian stenosis. Right carotid system: Patent. Calcified plaque along the common carotid with minimal stenosis. Calcified plaque along proximal internal carotid causing less than 50% stenosis. Left carotid system: Patent. Calcified plaque along the common carotid without stenosis. Mixed plaque along the proximal internal carotid causing less than 50% stenosis. Vertebral arteries: Patent and codominant. Multifocal calcified plaque without high-grade stenosis. Skeleton: Multilevel degenerative changes of the included spine. Degenerative changes of the  left temporomandibular joint. Other neck: Enlarged, heterogeneous left thyroid. Upper chest: No apical lung mass. Review of the MIP images confirms the above findings CTA HEAD FINDINGS Anterior circulation: Intracranial internal carotid arteries are patent with calcified plaque causing mild stenosis. Anterior and middle cerebral arteries are patent. Posterior circulation: Intracranial vertebral arteries are patent with mild plaque. Basilar artery is patent with fenestration proximally. Major cerebellar artery origins are patent. Posterior cerebral arteries are patent. Moderate stenosis at the left P1-P2 junction. Venous sinuses: As permitted by contrast timing, patent. Review of the MIP images confirms the above findings CT Brain Perfusion Findings: CBF (<30%) Volume: 030mPerfusion (Tmax>6.0s) volume: 1288mismatch Volume: 86m92mhis is within the inferior frontal lobes and likely artifactual. IMPRESSION: No large vessel occlusion. Plaque at the ICA origins causes less than 50% stenosis. Moderate stenosis left P1-P2 PCA junction. Calculated territory at risk of 12 mL within the inferior frontal lobes is likely artifactual. Heterogeneous, enlarged left thyroid. Nonemergent ultrasound could be considered if not previously performed and in the absence of significant comorbidities or decreased life expectancy. Initial results were communicated to Dr. BhagCurly Shores3:07 pm on 01/03/2021 by text page via the [facility] system. Electronically Signed   By: PranMacy Mis.   On: 01/03/2021 15:17    Microbiology: Recent Results (from the past 240 hour(s))  Urine Culture     Status: Abnormal   Collection Time: 01/12/21  7:05 AM   Specimen: Urine, Clean Catch  Result Value Ref Range Status   Specimen Description   Final    URINE, CLEAN CATCH [facility]formed [facility] [address]    Special Requests   Final    NONE Performed at [facility], [address], [place], [place]  [address]    Culture >=100,000 COLONIES/mL ESCHERICHIA COLI (A)  Final   Report Status 01/15/2021 FINAL  Final   Organism ID, Bacteria ESCHERICHIA COLI (A)  Final      Susceptibility   Escherichia coli - MIC*    AMPICILLIN 4 SENSITIVE Sensitive     CEFAZOLIN <=4 SENSITIVE Sensitive     CEFEPIME <=0.12 SENSITIVE Sensitive     CEFTRIAXONE <=0.25 SENSITIVE Sensitive     CIPROFLOXACIN <=0.25 SENSITIVE Sensitive     GENTAMICIN <=1 SENSITIVE Sensitive     IMIPENEM <=0.25 SENSITIVE Sensitive     NITROFURANTOIN <=16 SENSITIVE Sensitive     TRIMETH/SULFA <=20 SENSITIVE Sensitive     AMPICILLIN/SULBACTAM <=2 SENSITIVE Sensitive     PIP/TAZO <=4 SENSITIVE Sensitive     * >=100,000 COLONIES/mL ESCHERICHIA COLI  Resp Panel by RT-PCR (Flu A&B, Covid) Urine, Catheterized     Status: Abnormal   Collection Time: 01/12/21  7:31 AM   Specimen: Urine, Catheterized; Nasopharyngeal(NP) swabs in vial transport medium  Result Value Ref Range Status   SARS Coronavirus 2 by RT PCR POSITIVE (A) NEGATIVE Final    Comment: CRITICAL RESULT CALLED TO, READ BACK BY AND VERIFIED WITH: LONG,J AT 0935 ON 8.3.22 BY RUCINSKI,B (NOTE) SARS-CoV-2 target nucleic acids are DETECTED.  The SARS-CoV-2 RNA is generally detectable in upper respiratory specimens during the acute phase of infection. Positive results are indicative of the presence of the identified virus, but do not rule out bacterial infection or co-infection with other pathogens not detected by the test. Clinical correlation with patient history and other diagnostic information is necessary to determine patient infection status. The expected result is Negative.  Fact Sheet for Patients: EntrepreneurPulse.com.au  Fact Sheet for Healthcare Providers: IncredibleEmployment.be  This test is not yet approved or cleared by the [place] FDA and  has been authorized for detection and/or diagnosis of SARS-CoV-2 by FDA under  an Emergency Use Authorization (EUA).  This EUA will remain in effect (meaning th is test can be used) for the duration of  the COVID-19 declaration under Section 564(b)(1) of the Act, 21 U.S.C. section 360bbb-3(b)(1), unless the authorization is terminated or revoked sooner.     Influenza A by PCR NEGATIVE NEGATIVE Final   Influenza B by PCR NEGATIVE NEGATIVE Final    Comment: (NOTE) The Xpert Xpress SARS-CoV-2/FLU/RSV plus assay is intended as an aid in the diagnosis of influenza from Nasopharyngeal swab specimens and should not be used as a sole basis for treatment. Nasal washings and aspirates are unacceptable for Xpert Xpress SARS-CoV-2/FLU/RSV testing.  Fact Sheet for Patients: EntrepreneurPulse.com.au  Fact Sheet for Healthcare Providers: IncredibleEmployment.be  This test is not yet approved or cleared by the [place] FDA and has been authorized for detection and/or diagnosis of SARS-CoV-2 by FDA under an Emergency Use Authorization (EUA). This EUA will remain in effect (meaning this test can be used) for the duration of the COVID-19 declaration under Section 564(b)(1) of the Act, 21 U.S.C. section 360bbb-3(b)(1), unless the authorization is terminated or revoked.  Performed at [facility], [address], [place], [place] [address]   Culture, blood (routine x 2)     Status: None   Collection Time: 01/12/21  9:08 AM   Specimen: BLOOD RIGHT HAND  Result Value Ref Range Status   Specimen Description BLOOD RIGHT HAND  Final  Special Requests   Final    AEROBIC BOTTLE ONLY Blood Culture results may not be optimal due to an inadequate volume of blood received in culture bottles   Culture   Final    NO GROWTH 6 DAYS Performed at [facility] [address], [place], [place] [address]    Report Status 01/18/2021 FINAL  Final  Culture, blood (routine x 2)     Status: None   Collection Time: 01/12/21  9:09 AM   Specimen: Right  Antecubital; Blood  Result Value Ref Range Status   Specimen Description RIGHT ANTECUBITAL  Final   Special Requests   Final    BOTTLES DRAWN AEROBIC AND ANAEROBIC Blood Culture results may not be optimal due to an excessive volume of blood received in culture bottles   Culture   Final    NO GROWTH 6 DAYS Performed at [facility], [address] [place], [place] [address]    Report Status 01/18/2021 FINAL  Final  MRSA Next Gen by PCR, Nasal     Status: Abnormal   Collection Time: 01/13/21  9:51 PM   Specimen: Nasal Mucosa; Nasal Swab  Result Value Ref Range Status   MRSA by PCR Next Gen DETECTED (A) NOT DETECTED Final    Comment: RESULT CALLED TO, READ BACK BY AND VERIFIED WITH:  BODEY KINDLEY 8/5 @ 0112 BY S BEARD (NOTE) The GeneXpert MRSA Assay (FDA approved for NASAL specimens only), is one component of a comprehensive MRSA colonization surveillance program. It is not intended to diagnose MRSA infection nor to guide or monitor treatment for MRSA infections. Test performance is not FDA approved in patients less than 85 years old. Performed at [facility], [address], [place], [place] [address]      Labs: Basic Metabolic Panel: Recent Labs  Lab 01/12/21 0705 01/13/21 0506 01/14/21 0339  NA 137 141 140  K 3.0* 3.5 3.5  CL 103 113* 111  CO2 '23 23 24  ' GLUCOSE 292* 91 75  BUN 48* 36* 28*  CREATININE 2.08* 1.39* 0.96  CALCIUM 8.1* 7.7* 7.6*   Liver Function Tests: Recent Labs  Lab 01/12/21 0705 01/13/21 0506  AST 32 80*  ALT 21 25  ALKPHOS 57 47  BILITOT 0.5 0.4  PROT 6.5 5.5*  ALBUMIN 3.1* 2.6*   CBC: Recent Labs  Lab 01/12/21 0705 01/13/21 0506 01/14/21 0339  WBC 5.6 6.9 4.2  NEUTROABS 4.6  --   --   HGB 12.7 10.9* 10.8*  HCT 41.9 36.2 35.8*  MCV 88.0 88.9 89.1  PLT 178 153 142*    BNP (last 3 results) Recent Labs    11/23/20 1146 12/31/20 0346  BNP 788.0* 1,405.0*   CBG: Recent Labs  Lab 01/13/21 1845 01/14/21 0755 01/14/21 1237  01/14/21 1636 01/15/21 0756  GLUCAP 77 95 78 91 82    Signed:  Barton Dubois MD.  [facility] 01/18/2021, 3:54 PM

## 2021-01-18 NOTE — TOC Transition Note (Signed)
Transition of Care [facility]) - CM/SW Discharge Note   Patient Details  Name: Rebecca Huerta MRN: [id number] Date of Birth: 12-01-1945  Transition of Care [facility]) CM/SW Contact:  Barry Brunner, LCSW Phone Number: 01/18/2021, 3:59 PM   Clinical Narrative:    CSW notified of [facility]'s decline for SNF. CSW notified patient's daughter. CSW spoke with [name] for [facility]. [name] agreeable to provide [facility] services. CSW scheduled transport with [facility] transport. TOC signing off.    Final next level of care: Home w Home Health Services Barriers to Discharge: Barriers Resolved   Patient Goals and CMS Choice Patient states their goals for this hospitalization and ongoing recovery are:: Return home with [facility] CMS Medicare.gov Compare Post Acute Care list provided to:: Patient Choice offered to / list presented to : Patient  Discharge Placement                Patient to be transferred to facility by: [facility] Name of family member notified: [name] Patient and family notified of of transfer: 01/18/21  Discharge Plan and Services                          HH Arranged: RN, PT, Social Work [facility] Agency: [facility] Date HH Agency Contacted: 01/18/21 Time HH Agency Contacted: 1559 Representative spoke with at [facility] Agency: [name]  Social Determinants of Health (SDOH) Interventions     Readmission Risk Interventions Readmission Risk Prevention Plan 01/02/2021  Transportation Screening Complete  PCP or Specialist Appt within 5-7 Days Complete  Home Care Screening Complete  Medication Review (RN CM) Complete  Some recent data might be hidden

## 2021-01-19 NOTE — Telephone Encounter (Signed)
Prescription refill request for Xarelto received.  Indication: Atrial Fib  Last office visit: 11/25/20  Dominga Ferry MD Weight: 60kg Age: 75 Scr: 0.96 CrCl: 47.96  Based on above findings Xarelto 15mg  daily (renal) is the appropriate dose.  Refill approved.

## 2021-01-20 DIAGNOSIS — N179 Acute kidney failure, unspecified: Secondary | ICD-10-CM | POA: Diagnosis not present

## 2021-01-20 DIAGNOSIS — B9620 Unspecified Escherichia coli [E. coli] as the cause of diseases classified elsewhere: Secondary | ICD-10-CM | POA: Diagnosis not present

## 2021-01-20 DIAGNOSIS — Z87891 Personal history of nicotine dependence: Secondary | ICD-10-CM | POA: Diagnosis not present

## 2021-01-20 DIAGNOSIS — F0391 Unspecified dementia with behavioral disturbance: Secondary | ICD-10-CM | POA: Diagnosis not present

## 2021-01-20 DIAGNOSIS — I5022 Chronic systolic (congestive) heart failure: Secondary | ICD-10-CM | POA: Diagnosis not present

## 2021-01-20 DIAGNOSIS — J449 Chronic obstructive pulmonary disease, unspecified: Secondary | ICD-10-CM | POA: Diagnosis not present

## 2021-01-20 DIAGNOSIS — I130 Hypertensive heart and chronic kidney disease with heart failure and stage 1 through stage 4 chronic kidney disease, or unspecified chronic kidney disease: Secondary | ICD-10-CM | POA: Diagnosis not present

## 2021-01-20 DIAGNOSIS — Z7951 Long term (current) use of inhaled steroids: Secondary | ICD-10-CM | POA: Diagnosis not present

## 2021-01-20 DIAGNOSIS — F419 Anxiety disorder, unspecified: Secondary | ICD-10-CM | POA: Diagnosis not present

## 2021-01-20 DIAGNOSIS — N390 Urinary tract infection, site not specified: Secondary | ICD-10-CM | POA: Diagnosis not present

## 2021-01-20 DIAGNOSIS — Z7901 Long term (current) use of anticoagulants: Secondary | ICD-10-CM | POA: Diagnosis not present

## 2021-01-20 DIAGNOSIS — I4821 Permanent atrial fibrillation: Secondary | ICD-10-CM | POA: Diagnosis not present

## 2021-01-20 DIAGNOSIS — U071 COVID-19: Secondary | ICD-10-CM | POA: Diagnosis not present

## 2021-01-20 DIAGNOSIS — I5020 Unspecified systolic (congestive) heart failure: Secondary | ICD-10-CM | POA: Diagnosis not present

## 2021-01-20 DIAGNOSIS — N1832 Chronic kidney disease, stage 3b: Secondary | ICD-10-CM | POA: Diagnosis not present

## 2021-01-24 ENCOUNTER — Ambulatory Visit: Payer: PPO | Admitting: Physician Assistant

## 2021-01-28 DIAGNOSIS — I5022 Chronic systolic (congestive) heart failure: Secondary | ICD-10-CM | POA: Diagnosis not present

## 2021-01-28 DIAGNOSIS — N390 Urinary tract infection, site not specified: Secondary | ICD-10-CM | POA: Diagnosis not present

## 2021-01-28 DIAGNOSIS — Z7951 Long term (current) use of inhaled steroids: Secondary | ICD-10-CM | POA: Diagnosis not present

## 2021-01-28 DIAGNOSIS — U071 COVID-19: Secondary | ICD-10-CM | POA: Diagnosis not present

## 2021-01-28 DIAGNOSIS — F0391 Unspecified dementia with behavioral disturbance: Secondary | ICD-10-CM | POA: Diagnosis not present

## 2021-01-28 DIAGNOSIS — Z87891 Personal history of nicotine dependence: Secondary | ICD-10-CM | POA: Diagnosis not present

## 2021-01-28 DIAGNOSIS — I5020 Unspecified systolic (congestive) heart failure: Secondary | ICD-10-CM | POA: Diagnosis not present

## 2021-01-28 DIAGNOSIS — F419 Anxiety disorder, unspecified: Secondary | ICD-10-CM | POA: Diagnosis not present

## 2021-01-28 DIAGNOSIS — I130 Hypertensive heart and chronic kidney disease with heart failure and stage 1 through stage 4 chronic kidney disease, or unspecified chronic kidney disease: Secondary | ICD-10-CM | POA: Diagnosis not present

## 2021-01-28 DIAGNOSIS — I4821 Permanent atrial fibrillation: Secondary | ICD-10-CM | POA: Diagnosis not present

## 2021-01-28 DIAGNOSIS — Z7901 Long term (current) use of anticoagulants: Secondary | ICD-10-CM | POA: Diagnosis not present

## 2021-01-28 DIAGNOSIS — B9620 Unspecified Escherichia coli [E. coli] as the cause of diseases classified elsewhere: Secondary | ICD-10-CM | POA: Diagnosis not present

## 2021-01-28 DIAGNOSIS — N179 Acute kidney failure, unspecified: Secondary | ICD-10-CM | POA: Diagnosis not present

## 2021-01-28 DIAGNOSIS — J449 Chronic obstructive pulmonary disease, unspecified: Secondary | ICD-10-CM | POA: Diagnosis not present

## 2021-01-28 DIAGNOSIS — N1832 Chronic kidney disease, stage 3b: Secondary | ICD-10-CM | POA: Diagnosis not present

## 2021-02-02 DIAGNOSIS — J449 Chronic obstructive pulmonary disease, unspecified: Secondary | ICD-10-CM | POA: Diagnosis not present

## 2021-02-02 DIAGNOSIS — I10 Essential (primary) hypertension: Secondary | ICD-10-CM | POA: Diagnosis not present

## 2021-02-02 DIAGNOSIS — Z8616 Personal history of COVID-19: Secondary | ICD-10-CM | POA: Diagnosis not present

## 2021-02-02 DIAGNOSIS — R21 Rash and other nonspecific skin eruption: Secondary | ICD-10-CM | POA: Diagnosis not present

## 2021-02-02 DIAGNOSIS — I4891 Unspecified atrial fibrillation: Secondary | ICD-10-CM | POA: Diagnosis not present

## 2021-02-02 DIAGNOSIS — N18 Chronic kidney disease (CKD): Secondary | ICD-10-CM | POA: Diagnosis not present

## 2021-02-02 DIAGNOSIS — N3946 Mixed incontinence: Secondary | ICD-10-CM | POA: Diagnosis not present

## 2021-02-02 DIAGNOSIS — F0390 Unspecified dementia without behavioral disturbance: Secondary | ICD-10-CM | POA: Diagnosis not present

## 2021-02-02 DIAGNOSIS — I509 Heart failure, unspecified: Secondary | ICD-10-CM | POA: Diagnosis not present

## 2021-02-03 DIAGNOSIS — N179 Acute kidney failure, unspecified: Secondary | ICD-10-CM | POA: Diagnosis not present

## 2021-02-03 DIAGNOSIS — B9620 Unspecified Escherichia coli [E. coli] as the cause of diseases classified elsewhere: Secondary | ICD-10-CM | POA: Diagnosis not present

## 2021-02-03 DIAGNOSIS — N390 Urinary tract infection, site not specified: Secondary | ICD-10-CM | POA: Diagnosis not present

## 2021-02-03 DIAGNOSIS — I5022 Chronic systolic (congestive) heart failure: Secondary | ICD-10-CM | POA: Diagnosis not present

## 2021-02-03 DIAGNOSIS — J449 Chronic obstructive pulmonary disease, unspecified: Secondary | ICD-10-CM | POA: Diagnosis not present

## 2021-02-03 DIAGNOSIS — U071 COVID-19: Secondary | ICD-10-CM | POA: Diagnosis not present

## 2021-02-03 DIAGNOSIS — Z7951 Long term (current) use of inhaled steroids: Secondary | ICD-10-CM | POA: Diagnosis not present

## 2021-02-03 DIAGNOSIS — N1832 Chronic kidney disease, stage 3b: Secondary | ICD-10-CM | POA: Diagnosis not present

## 2021-02-03 DIAGNOSIS — Z87891 Personal history of nicotine dependence: Secondary | ICD-10-CM | POA: Diagnosis not present

## 2021-02-03 DIAGNOSIS — I130 Hypertensive heart and chronic kidney disease with heart failure and stage 1 through stage 4 chronic kidney disease, or unspecified chronic kidney disease: Secondary | ICD-10-CM | POA: Diagnosis not present

## 2021-02-03 DIAGNOSIS — F0391 Unspecified dementia with behavioral disturbance: Secondary | ICD-10-CM | POA: Diagnosis not present

## 2021-02-03 DIAGNOSIS — F419 Anxiety disorder, unspecified: Secondary | ICD-10-CM | POA: Diagnosis not present

## 2021-02-03 DIAGNOSIS — I5020 Unspecified systolic (congestive) heart failure: Secondary | ICD-10-CM | POA: Diagnosis not present

## 2021-02-03 DIAGNOSIS — Z7901 Long term (current) use of anticoagulants: Secondary | ICD-10-CM | POA: Diagnosis not present

## 2021-02-03 DIAGNOSIS — I4821 Permanent atrial fibrillation: Secondary | ICD-10-CM | POA: Diagnosis not present

## 2021-02-10 DIAGNOSIS — U071 COVID-19: Secondary | ICD-10-CM | POA: Diagnosis not present

## 2021-02-10 DIAGNOSIS — F0391 Unspecified dementia with behavioral disturbance: Secondary | ICD-10-CM | POA: Diagnosis not present

## 2021-02-10 DIAGNOSIS — J449 Chronic obstructive pulmonary disease, unspecified: Secondary | ICD-10-CM | POA: Diagnosis not present

## 2021-02-10 DIAGNOSIS — B9620 Unspecified Escherichia coli [E. coli] as the cause of diseases classified elsewhere: Secondary | ICD-10-CM | POA: Diagnosis not present

## 2021-02-10 DIAGNOSIS — I5022 Chronic systolic (congestive) heart failure: Secondary | ICD-10-CM | POA: Diagnosis not present

## 2021-02-10 DIAGNOSIS — Z87891 Personal history of nicotine dependence: Secondary | ICD-10-CM | POA: Diagnosis not present

## 2021-02-10 DIAGNOSIS — Z7951 Long term (current) use of inhaled steroids: Secondary | ICD-10-CM | POA: Diagnosis not present

## 2021-02-10 DIAGNOSIS — N1832 Chronic kidney disease, stage 3b: Secondary | ICD-10-CM | POA: Diagnosis not present

## 2021-02-10 DIAGNOSIS — I4821 Permanent atrial fibrillation: Secondary | ICD-10-CM | POA: Diagnosis not present

## 2021-02-10 DIAGNOSIS — N390 Urinary tract infection, site not specified: Secondary | ICD-10-CM | POA: Diagnosis not present

## 2021-02-10 DIAGNOSIS — Z7901 Long term (current) use of anticoagulants: Secondary | ICD-10-CM | POA: Diagnosis not present

## 2021-02-10 DIAGNOSIS — F419 Anxiety disorder, unspecified: Secondary | ICD-10-CM | POA: Diagnosis not present

## 2021-02-10 DIAGNOSIS — I5020 Unspecified systolic (congestive) heart failure: Secondary | ICD-10-CM | POA: Diagnosis not present

## 2021-02-10 DIAGNOSIS — N179 Acute kidney failure, unspecified: Secondary | ICD-10-CM | POA: Diagnosis not present

## 2021-02-10 DIAGNOSIS — I130 Hypertensive heart and chronic kidney disease with heart failure and stage 1 through stage 4 chronic kidney disease, or unspecified chronic kidney disease: Secondary | ICD-10-CM | POA: Diagnosis not present

## 2021-02-11 DIAGNOSIS — N182 Chronic kidney disease, stage 2 (mild): Secondary | ICD-10-CM | POA: Diagnosis not present

## 2021-02-11 DIAGNOSIS — I509 Heart failure, unspecified: Secondary | ICD-10-CM | POA: Diagnosis not present

## 2021-02-16 DIAGNOSIS — R21 Rash and other nonspecific skin eruption: Secondary | ICD-10-CM | POA: Diagnosis not present

## 2021-02-16 DIAGNOSIS — J449 Chronic obstructive pulmonary disease, unspecified: Secondary | ICD-10-CM | POA: Diagnosis not present

## 2021-02-16 DIAGNOSIS — I10 Essential (primary) hypertension: Secondary | ICD-10-CM | POA: Diagnosis not present

## 2021-02-16 DIAGNOSIS — F0390 Unspecified dementia without behavioral disturbance: Secondary | ICD-10-CM | POA: Diagnosis not present

## 2021-02-16 DIAGNOSIS — N3946 Mixed incontinence: Secondary | ICD-10-CM | POA: Diagnosis not present

## 2021-02-16 DIAGNOSIS — F419 Anxiety disorder, unspecified: Secondary | ICD-10-CM | POA: Diagnosis not present

## 2021-02-16 DIAGNOSIS — I4891 Unspecified atrial fibrillation: Secondary | ICD-10-CM | POA: Diagnosis not present

## 2021-02-16 DIAGNOSIS — Z8616 Personal history of COVID-19: Secondary | ICD-10-CM | POA: Diagnosis not present

## 2021-02-16 DIAGNOSIS — I509 Heart failure, unspecified: Secondary | ICD-10-CM | POA: Diagnosis not present

## 2021-02-16 DIAGNOSIS — N1832 Chronic kidney disease, stage 3b: Secondary | ICD-10-CM | POA: Diagnosis not present

## 2021-03-16 DIAGNOSIS — R4182 Altered mental status, unspecified: Secondary | ICD-10-CM | POA: Diagnosis not present

## 2021-03-23 DIAGNOSIS — I4891 Unspecified atrial fibrillation: Secondary | ICD-10-CM | POA: Diagnosis not present

## 2021-03-23 DIAGNOSIS — F419 Anxiety disorder, unspecified: Secondary | ICD-10-CM | POA: Diagnosis not present

## 2021-03-23 DIAGNOSIS — I10 Essential (primary) hypertension: Secondary | ICD-10-CM | POA: Diagnosis not present

## 2021-03-23 DIAGNOSIS — I509 Heart failure, unspecified: Secondary | ICD-10-CM | POA: Diagnosis not present

## 2021-03-23 DIAGNOSIS — J449 Chronic obstructive pulmonary disease, unspecified: Secondary | ICD-10-CM | POA: Diagnosis not present

## 2021-03-23 DIAGNOSIS — Z8616 Personal history of COVID-19: Secondary | ICD-10-CM | POA: Diagnosis not present

## 2021-03-23 DIAGNOSIS — N1832 Chronic kidney disease, stage 3b: Secondary | ICD-10-CM | POA: Diagnosis not present

## 2021-03-23 DIAGNOSIS — N3946 Mixed incontinence: Secondary | ICD-10-CM | POA: Diagnosis not present

## 2021-03-23 DIAGNOSIS — F0390 Unspecified dementia without behavioral disturbance: Secondary | ICD-10-CM | POA: Diagnosis not present

## 2021-03-25 DIAGNOSIS — E876 Hypokalemia: Secondary | ICD-10-CM | POA: Diagnosis not present

## 2021-03-29 DIAGNOSIS — D638 Anemia in other chronic diseases classified elsewhere: Secondary | ICD-10-CM | POA: Diagnosis not present

## 2021-03-29 DIAGNOSIS — M8448XA Pathological fracture, other site, initial encounter for fracture: Secondary | ICD-10-CM | POA: Diagnosis not present

## 2021-03-29 DIAGNOSIS — S52501A Unspecified fracture of the lower end of right radius, initial encounter for closed fracture: Secondary | ICD-10-CM | POA: Diagnosis not present

## 2021-03-29 DIAGNOSIS — S7291XA Unspecified fracture of right femur, initial encounter for closed fracture: Secondary | ICD-10-CM | POA: Diagnosis not present

## 2021-03-29 DIAGNOSIS — R000 Tachycardia, unspecified: Secondary | ICD-10-CM | POA: Diagnosis not present

## 2021-03-29 DIAGNOSIS — E041 Nontoxic single thyroid nodule: Secondary | ICD-10-CM | POA: Diagnosis not present

## 2021-03-29 DIAGNOSIS — F03911 Unspecified dementia, unspecified severity, with agitation: Secondary | ICD-10-CM | POA: Diagnosis not present

## 2021-03-29 DIAGNOSIS — J441 Chronic obstructive pulmonary disease with (acute) exacerbation: Secondary | ICD-10-CM | POA: Diagnosis not present

## 2021-03-29 DIAGNOSIS — Z1622 Resistance to vancomycin related antibiotics: Secondary | ICD-10-CM | POA: Diagnosis not present

## 2021-03-29 DIAGNOSIS — I4891 Unspecified atrial fibrillation: Secondary | ICD-10-CM | POA: Diagnosis not present

## 2021-03-29 DIAGNOSIS — S72001A Fracture of unspecified part of neck of right femur, initial encounter for closed fracture: Secondary | ICD-10-CM | POA: Diagnosis not present

## 2021-03-29 DIAGNOSIS — S72141A Displaced intertrochanteric fracture of right femur, initial encounter for closed fracture: Secondary | ICD-10-CM | POA: Diagnosis not present

## 2021-03-29 DIAGNOSIS — S32018A Other fracture of first lumbar vertebra, initial encounter for closed fracture: Secondary | ICD-10-CM | POA: Diagnosis not present

## 2021-03-29 DIAGNOSIS — Z1621 Resistance to vancomycin: Secondary | ICD-10-CM | POA: Diagnosis not present

## 2021-03-29 DIAGNOSIS — F0390 Unspecified dementia without behavioral disturbance: Secondary | ICD-10-CM | POA: Diagnosis not present

## 2021-03-29 DIAGNOSIS — B952 Enterococcus as the cause of diseases classified elsewhere: Secondary | ICD-10-CM | POA: Diagnosis not present

## 2021-03-29 DIAGNOSIS — I509 Heart failure, unspecified: Secondary | ICD-10-CM | POA: Diagnosis not present

## 2021-03-29 DIAGNOSIS — M79652 Pain in left thigh: Secondary | ICD-10-CM | POA: Diagnosis not present

## 2021-03-29 DIAGNOSIS — E042 Nontoxic multinodular goiter: Secondary | ICD-10-CM | POA: Diagnosis not present

## 2021-03-29 DIAGNOSIS — M7989 Other specified soft tissue disorders: Secondary | ICD-10-CM | POA: Diagnosis not present

## 2021-03-29 DIAGNOSIS — D62 Acute posthemorrhagic anemia: Secondary | ICD-10-CM | POA: Diagnosis not present

## 2021-03-29 DIAGNOSIS — B9562 Methicillin resistant Staphylococcus aureus infection as the cause of diseases classified elsewhere: Secondary | ICD-10-CM | POA: Diagnosis not present

## 2021-03-29 DIAGNOSIS — Z66 Do not resuscitate: Secondary | ICD-10-CM | POA: Diagnosis not present

## 2021-03-29 DIAGNOSIS — I110 Hypertensive heart disease with heart failure: Secondary | ICD-10-CM | POA: Diagnosis not present

## 2021-03-29 DIAGNOSIS — X58XXXA Exposure to other specified factors, initial encounter: Secondary | ICD-10-CM | POA: Diagnosis not present

## 2021-03-29 DIAGNOSIS — S32029A Unspecified fracture of second lumbar vertebra, initial encounter for closed fracture: Secondary | ICD-10-CM | POA: Diagnosis not present

## 2021-03-29 DIAGNOSIS — S32019A Unspecified fracture of first lumbar vertebra, initial encounter for closed fracture: Secondary | ICD-10-CM | POA: Diagnosis not present

## 2021-03-29 DIAGNOSIS — N390 Urinary tract infection, site not specified: Secondary | ICD-10-CM | POA: Diagnosis not present

## 2021-03-29 DIAGNOSIS — R9431 Abnormal electrocardiogram [ECG] [EKG]: Secondary | ICD-10-CM | POA: Diagnosis not present

## 2021-03-29 DIAGNOSIS — W19XXXA Unspecified fall, initial encounter: Secondary | ICD-10-CM | POA: Diagnosis not present

## 2021-03-29 DIAGNOSIS — S32010A Wedge compression fracture of first lumbar vertebra, initial encounter for closed fracture: Secondary | ICD-10-CM | POA: Diagnosis not present

## 2021-03-29 DIAGNOSIS — Z23 Encounter for immunization: Secondary | ICD-10-CM | POA: Diagnosis not present

## 2021-03-29 DIAGNOSIS — S32020A Wedge compression fracture of second lumbar vertebra, initial encounter for closed fracture: Secondary | ICD-10-CM | POA: Diagnosis not present

## 2021-03-29 DIAGNOSIS — R278 Other lack of coordination: Secondary | ICD-10-CM | POA: Diagnosis not present

## 2021-03-29 DIAGNOSIS — S59201A Unspecified physeal fracture of lower end of radius, right arm, initial encounter for closed fracture: Secondary | ICD-10-CM | POA: Diagnosis not present

## 2021-03-29 DIAGNOSIS — J449 Chronic obstructive pulmonary disease, unspecified: Secondary | ICD-10-CM | POA: Diagnosis not present

## 2021-03-29 DIAGNOSIS — Z20822 Contact with and (suspected) exposure to covid-19: Secondary | ICD-10-CM | POA: Diagnosis not present

## 2021-03-29 DIAGNOSIS — F03918 Unspecified dementia, unspecified severity, with other behavioral disturbance: Secondary | ICD-10-CM | POA: Diagnosis not present

## 2021-03-29 DIAGNOSIS — I10 Essential (primary) hypertension: Secondary | ICD-10-CM | POA: Diagnosis not present

## 2021-03-29 DIAGNOSIS — Z043 Encounter for examination and observation following other accident: Secondary | ICD-10-CM | POA: Diagnosis not present

## 2021-03-29 DIAGNOSIS — R5381 Other malaise: Secondary | ICD-10-CM | POA: Diagnosis not present

## 2021-03-29 DIAGNOSIS — F32A Depression, unspecified: Secondary | ICD-10-CM | POA: Diagnosis not present

## 2021-03-29 DIAGNOSIS — I517 Cardiomegaly: Secondary | ICD-10-CM | POA: Diagnosis not present

## 2021-03-30 DIAGNOSIS — I10 Essential (primary) hypertension: Secondary | ICD-10-CM | POA: Diagnosis not present

## 2021-03-30 DIAGNOSIS — J449 Chronic obstructive pulmonary disease, unspecified: Secondary | ICD-10-CM | POA: Diagnosis not present

## 2021-03-30 DIAGNOSIS — N390 Urinary tract infection, site not specified: Secondary | ICD-10-CM | POA: Diagnosis not present

## 2021-03-30 DIAGNOSIS — S52501A Unspecified fracture of the lower end of right radius, initial encounter for closed fracture: Secondary | ICD-10-CM | POA: Diagnosis not present

## 2021-03-30 DIAGNOSIS — I4891 Unspecified atrial fibrillation: Secondary | ICD-10-CM | POA: Diagnosis not present

## 2021-03-30 DIAGNOSIS — F0390 Unspecified dementia without behavioral disturbance: Secondary | ICD-10-CM | POA: Diagnosis not present

## 2021-03-30 DIAGNOSIS — I509 Heart failure, unspecified: Secondary | ICD-10-CM | POA: Diagnosis not present

## 2021-03-30 DIAGNOSIS — S72141A Displaced intertrochanteric fracture of right femur, initial encounter for closed fracture: Secondary | ICD-10-CM | POA: Diagnosis not present

## 2021-03-30 DIAGNOSIS — S32020A Wedge compression fracture of second lumbar vertebra, initial encounter for closed fracture: Secondary | ICD-10-CM | POA: Diagnosis not present

## 2021-03-30 DIAGNOSIS — S32010A Wedge compression fracture of first lumbar vertebra, initial encounter for closed fracture: Secondary | ICD-10-CM | POA: Diagnosis not present

## 2021-04-15 DIAGNOSIS — I509 Heart failure, unspecified: Secondary | ICD-10-CM | POA: Diagnosis not present

## 2021-04-15 DIAGNOSIS — I4891 Unspecified atrial fibrillation: Secondary | ICD-10-CM | POA: Diagnosis not present

## 2021-04-15 DIAGNOSIS — I10 Essential (primary) hypertension: Secondary | ICD-10-CM | POA: Diagnosis not present

## 2021-04-15 DIAGNOSIS — R5381 Other malaise: Secondary | ICD-10-CM | POA: Diagnosis not present

## 2021-04-15 DIAGNOSIS — F0390 Unspecified dementia without behavioral disturbance: Secondary | ICD-10-CM | POA: Diagnosis not present

## 2021-04-19 DIAGNOSIS — I10 Essential (primary) hypertension: Secondary | ICD-10-CM | POA: Diagnosis not present

## 2021-04-19 DIAGNOSIS — I509 Heart failure, unspecified: Secondary | ICD-10-CM | POA: Diagnosis not present

## 2021-04-19 DIAGNOSIS — F0390 Unspecified dementia without behavioral disturbance: Secondary | ICD-10-CM | POA: Diagnosis not present

## 2021-04-19 DIAGNOSIS — I4891 Unspecified atrial fibrillation: Secondary | ICD-10-CM | POA: Diagnosis not present

## 2021-04-19 DIAGNOSIS — F419 Anxiety disorder, unspecified: Secondary | ICD-10-CM | POA: Diagnosis not present

## 2021-04-19 DIAGNOSIS — S62101D Fracture of unspecified carpal bone, right wrist, subsequent encounter for fracture with routine healing: Secondary | ICD-10-CM | POA: Diagnosis not present

## 2021-04-19 DIAGNOSIS — S72001D Fracture of unspecified part of neck of right femur, subsequent encounter for closed fracture with routine healing: Secondary | ICD-10-CM | POA: Diagnosis not present

## 2021-04-25 DIAGNOSIS — I4891 Unspecified atrial fibrillation: Secondary | ICD-10-CM | POA: Diagnosis not present

## 2021-04-25 DIAGNOSIS — W19XXXA Unspecified fall, initial encounter: Secondary | ICD-10-CM | POA: Diagnosis not present

## 2021-04-25 DIAGNOSIS — F0390 Unspecified dementia without behavioral disturbance: Secondary | ICD-10-CM | POA: Diagnosis not present

## 2021-05-16 DIAGNOSIS — I509 Heart failure, unspecified: Secondary | ICD-10-CM | POA: Diagnosis not present

## 2021-05-16 DIAGNOSIS — I4891 Unspecified atrial fibrillation: Secondary | ICD-10-CM | POA: Diagnosis not present

## 2021-05-16 DIAGNOSIS — I10 Essential (primary) hypertension: Secondary | ICD-10-CM | POA: Diagnosis not present

## 2021-05-16 DIAGNOSIS — S72001D Fracture of unspecified part of neck of right femur, subsequent encounter for closed fracture with routine healing: Secondary | ICD-10-CM | POA: Diagnosis not present

## 2021-05-16 DIAGNOSIS — F0390 Unspecified dementia without behavioral disturbance: Secondary | ICD-10-CM | POA: Diagnosis not present

## 2021-05-16 DIAGNOSIS — S62101D Fracture of unspecified carpal bone, right wrist, subsequent encounter for fracture with routine healing: Secondary | ICD-10-CM | POA: Diagnosis not present

## 2021-05-25 DIAGNOSIS — M160 Bilateral primary osteoarthritis of hip: Secondary | ICD-10-CM | POA: Diagnosis not present

## 2021-05-25 DIAGNOSIS — L89522 Pressure ulcer of left ankle, stage 2: Secondary | ICD-10-CM | POA: Diagnosis not present

## 2021-05-25 DIAGNOSIS — F03911 Unspecified dementia, unspecified severity, with agitation: Secondary | ICD-10-CM | POA: Diagnosis not present

## 2021-05-25 DIAGNOSIS — R269 Unspecified abnormalities of gait and mobility: Secondary | ICD-10-CM | POA: Diagnosis not present

## 2021-05-25 DIAGNOSIS — I10 Essential (primary) hypertension: Secondary | ICD-10-CM | POA: Diagnosis not present

## 2021-06-01 DIAGNOSIS — L89623 Pressure ulcer of left heel, stage 3: Secondary | ICD-10-CM | POA: Diagnosis not present

## 2021-06-01 DIAGNOSIS — R269 Unspecified abnormalities of gait and mobility: Secondary | ICD-10-CM | POA: Diagnosis not present

## 2021-06-01 DIAGNOSIS — F03911 Unspecified dementia, unspecified severity, with agitation: Secondary | ICD-10-CM | POA: Diagnosis not present
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
## 2021-12-10 DEATH — deceased

## 2023-04-16 ENCOUNTER — Encounter (HOSPITAL_COMMUNITY): Payer: Self-pay | Admitting: Internal Medicine
